# Patient Record
Sex: Female | Born: 1940 | Race: White | Hispanic: No | Marital: Married | Smoking: Never smoker
Health system: Southern US, Community
[De-identification: ages and names within clinical notes are randomized; demographics above are authoritative.]

## PROBLEM LIST (undated history)

## (undated) DIAGNOSIS — M1990 Unspecified osteoarthritis, unspecified site: Secondary | ICD-10-CM

## (undated) DIAGNOSIS — I10 Essential (primary) hypertension: Secondary | ICD-10-CM

## (undated) DIAGNOSIS — R7303 Prediabetes: Secondary | ICD-10-CM

## (undated) DIAGNOSIS — E079 Disorder of thyroid, unspecified: Secondary | ICD-10-CM

## (undated) DIAGNOSIS — E785 Hyperlipidemia, unspecified: Secondary | ICD-10-CM

## (undated) DIAGNOSIS — E559 Vitamin D deficiency, unspecified: Secondary | ICD-10-CM

## (undated) HISTORY — DX: Vitamin D deficiency, unspecified: E55.9

## (undated) HISTORY — DX: Prediabetes: R73.03

## (undated) HISTORY — DX: Hyperlipidemia, unspecified: E78.5

## (undated) HISTORY — DX: Essential (primary) hypertension: I10

## (undated) HISTORY — DX: Disorder of thyroid, unspecified: E07.9

## (undated) HISTORY — PX: APPENDECTOMY: SHX54

## (undated) HISTORY — PX: TONSILLECTOMY: SHX5217

## (undated) HISTORY — DX: Unspecified osteoarthritis, unspecified site: M19.90

---

## 1979-07-17 HISTORY — PX: OTHER SURGICAL HISTORY: SHX169

## 1998-02-08 ENCOUNTER — Ambulatory Visit (HOSPITAL_COMMUNITY): Admission: RE | Admit: 1998-02-08 | Discharge: 1998-02-08 | Payer: Self-pay | Admitting: *Deleted

## 1999-05-22 ENCOUNTER — Other Ambulatory Visit: Admission: RE | Admit: 1999-05-22 | Discharge: 1999-05-22 | Payer: Self-pay | Admitting: *Deleted

## 2006-11-18 ENCOUNTER — Other Ambulatory Visit: Admission: RE | Admit: 2006-11-18 | Discharge: 2006-11-18 | Payer: Self-pay | Admitting: Internal Medicine

## 2007-01-01 ENCOUNTER — Ambulatory Visit: Payer: Self-pay | Admitting: Gastroenterology

## 2007-01-06 ENCOUNTER — Ambulatory Visit (HOSPITAL_COMMUNITY): Admission: RE | Admit: 2007-01-06 | Discharge: 2007-01-06 | Payer: Self-pay | Admitting: Internal Medicine

## 2007-01-13 LAB — HM COLONOSCOPY

## 2007-01-20 ENCOUNTER — Ambulatory Visit: Payer: Self-pay | Admitting: Gastroenterology

## 2007-01-20 ENCOUNTER — Encounter: Payer: Self-pay | Admitting: Gastroenterology

## 2010-03-27 ENCOUNTER — Ambulatory Visit (HOSPITAL_COMMUNITY): Admission: RE | Admit: 2010-03-27 | Discharge: 2010-03-27 | Payer: Self-pay | Admitting: Internal Medicine

## 2010-03-27 LAB — HM DEXA SCAN

## 2010-05-30 ENCOUNTER — Other Ambulatory Visit: Admission: RE | Admit: 2010-05-30 | Discharge: 2010-05-30 | Payer: Self-pay | Admitting: Internal Medicine

## 2010-05-30 LAB — HM PAP SMEAR: HM Pap smear: NORMAL

## 2011-02-27 ENCOUNTER — Other Ambulatory Visit (HOSPITAL_COMMUNITY): Payer: Self-pay | Admitting: Internal Medicine

## 2011-02-27 DIAGNOSIS — Z1231 Encounter for screening mammogram for malignant neoplasm of breast: Secondary | ICD-10-CM

## 2011-04-02 ENCOUNTER — Ambulatory Visit (HOSPITAL_COMMUNITY)
Admission: RE | Admit: 2011-04-02 | Discharge: 2011-04-02 | Disposition: A | Discharge status: Home / Self Care | Payer: Medicare Other | Source: Ambulatory Visit | Attending: Internal Medicine | Admitting: Internal Medicine

## 2011-04-02 DIAGNOSIS — Z1231 Encounter for screening mammogram for malignant neoplasm of breast: Secondary | ICD-10-CM | POA: Insufficient documentation

## 2011-04-09 ENCOUNTER — Other Ambulatory Visit: Payer: Self-pay | Admitting: Internal Medicine

## 2011-04-09 DIAGNOSIS — R928 Other abnormal and inconclusive findings on diagnostic imaging of breast: Secondary | ICD-10-CM

## 2011-04-18 ENCOUNTER — Ambulatory Visit
Admission: RE | Admit: 2011-04-18 | Discharge: 2011-04-18 | Disposition: A | Discharge status: Home / Self Care | Payer: Medicare Other | Source: Ambulatory Visit | Attending: Internal Medicine | Admitting: Internal Medicine

## 2011-04-18 DIAGNOSIS — R928 Other abnormal and inconclusive findings on diagnostic imaging of breast: Secondary | ICD-10-CM

## 2011-09-12 DIAGNOSIS — I10 Essential (primary) hypertension: Secondary | ICD-10-CM | POA: Diagnosis not present

## 2011-09-12 DIAGNOSIS — E559 Vitamin D deficiency, unspecified: Secondary | ICD-10-CM | POA: Diagnosis not present

## 2011-09-12 DIAGNOSIS — Z79899 Other long term (current) drug therapy: Secondary | ICD-10-CM | POA: Diagnosis not present

## 2011-09-12 DIAGNOSIS — E782 Mixed hyperlipidemia: Secondary | ICD-10-CM | POA: Diagnosis not present

## 2011-09-12 DIAGNOSIS — R7309 Other abnormal glucose: Secondary | ICD-10-CM | POA: Diagnosis not present

## 2012-02-14 ENCOUNTER — Encounter: Payer: Self-pay | Admitting: Gastroenterology

## 2012-03-12 DIAGNOSIS — E782 Mixed hyperlipidemia: Secondary | ICD-10-CM | POA: Diagnosis not present

## 2012-03-12 DIAGNOSIS — E559 Vitamin D deficiency, unspecified: Secondary | ICD-10-CM | POA: Diagnosis not present

## 2012-03-12 DIAGNOSIS — R7309 Other abnormal glucose: Secondary | ICD-10-CM | POA: Diagnosis not present

## 2012-03-12 DIAGNOSIS — Z79899 Other long term (current) drug therapy: Secondary | ICD-10-CM | POA: Diagnosis not present

## 2012-03-12 DIAGNOSIS — I10 Essential (primary) hypertension: Secondary | ICD-10-CM | POA: Diagnosis not present

## 2012-03-19 DIAGNOSIS — H04129 Dry eye syndrome of unspecified lacrimal gland: Secondary | ICD-10-CM | POA: Diagnosis not present

## 2012-05-07 ENCOUNTER — Other Ambulatory Visit: Payer: Self-pay | Admitting: Internal Medicine

## 2012-05-07 DIAGNOSIS — Z1231 Encounter for screening mammogram for malignant neoplasm of breast: Secondary | ICD-10-CM

## 2012-06-16 ENCOUNTER — Ambulatory Visit
Admission: RE | Admit: 2012-06-16 | Discharge: 2012-06-16 | Disposition: A | Discharge status: Home / Self Care | Payer: Medicare Other | Source: Ambulatory Visit | Attending: Internal Medicine | Admitting: Internal Medicine

## 2012-06-16 DIAGNOSIS — I10 Essential (primary) hypertension: Secondary | ICD-10-CM | POA: Diagnosis not present

## 2012-06-16 DIAGNOSIS — Z1231 Encounter for screening mammogram for malignant neoplasm of breast: Secondary | ICD-10-CM | POA: Diagnosis not present

## 2012-06-16 DIAGNOSIS — Z79899 Other long term (current) drug therapy: Secondary | ICD-10-CM | POA: Diagnosis not present

## 2012-06-16 DIAGNOSIS — E782 Mixed hyperlipidemia: Secondary | ICD-10-CM | POA: Diagnosis not present

## 2012-06-16 DIAGNOSIS — E559 Vitamin D deficiency, unspecified: Secondary | ICD-10-CM | POA: Diagnosis not present

## 2012-06-16 DIAGNOSIS — R7309 Other abnormal glucose: Secondary | ICD-10-CM | POA: Diagnosis not present

## 2012-07-30 DIAGNOSIS — E039 Hypothyroidism, unspecified: Secondary | ICD-10-CM | POA: Diagnosis not present

## 2012-09-01 DIAGNOSIS — E039 Hypothyroidism, unspecified: Secondary | ICD-10-CM | POA: Diagnosis not present

## 2012-10-15 DIAGNOSIS — L919 Hypertrophic disorder of the skin, unspecified: Secondary | ICD-10-CM | POA: Diagnosis not present

## 2012-10-15 DIAGNOSIS — L909 Atrophic disorder of skin, unspecified: Secondary | ICD-10-CM | POA: Diagnosis not present

## 2012-10-15 DIAGNOSIS — L570 Actinic keratosis: Secondary | ICD-10-CM | POA: Diagnosis not present

## 2012-10-15 DIAGNOSIS — L819 Disorder of pigmentation, unspecified: Secondary | ICD-10-CM | POA: Diagnosis not present

## 2012-10-15 DIAGNOSIS — L821 Other seborrheic keratosis: Secondary | ICD-10-CM | POA: Diagnosis not present

## 2012-10-15 DIAGNOSIS — D1801 Hemangioma of skin and subcutaneous tissue: Secondary | ICD-10-CM | POA: Diagnosis not present

## 2012-10-23 ENCOUNTER — Encounter: Payer: Self-pay | Admitting: Gastroenterology

## 2012-12-03 DIAGNOSIS — L821 Other seborrheic keratosis: Secondary | ICD-10-CM | POA: Diagnosis not present

## 2012-12-03 DIAGNOSIS — L570 Actinic keratosis: Secondary | ICD-10-CM | POA: Diagnosis not present

## 2012-12-03 DIAGNOSIS — L909 Atrophic disorder of skin, unspecified: Secondary | ICD-10-CM | POA: Diagnosis not present

## 2012-12-15 DIAGNOSIS — I10 Essential (primary) hypertension: Secondary | ICD-10-CM | POA: Diagnosis not present

## 2012-12-15 DIAGNOSIS — R7309 Other abnormal glucose: Secondary | ICD-10-CM | POA: Diagnosis not present

## 2012-12-15 DIAGNOSIS — E559 Vitamin D deficiency, unspecified: Secondary | ICD-10-CM | POA: Diagnosis not present

## 2012-12-15 DIAGNOSIS — E782 Mixed hyperlipidemia: Secondary | ICD-10-CM | POA: Diagnosis not present

## 2012-12-15 DIAGNOSIS — Z79899 Other long term (current) drug therapy: Secondary | ICD-10-CM | POA: Diagnosis not present

## 2013-04-17 ENCOUNTER — Other Ambulatory Visit: Payer: Self-pay | Admitting: Dermatology

## 2013-04-17 DIAGNOSIS — D485 Neoplasm of uncertain behavior of skin: Secondary | ICD-10-CM | POA: Diagnosis not present

## 2013-04-17 DIAGNOSIS — L578 Other skin changes due to chronic exposure to nonionizing radiation: Secondary | ICD-10-CM | POA: Diagnosis not present

## 2013-04-17 DIAGNOSIS — L259 Unspecified contact dermatitis, unspecified cause: Secondary | ICD-10-CM | POA: Diagnosis not present

## 2013-04-29 DIAGNOSIS — L570 Actinic keratosis: Secondary | ICD-10-CM | POA: Diagnosis not present

## 2013-05-08 ENCOUNTER — Other Ambulatory Visit: Payer: Self-pay

## 2013-05-08 DIAGNOSIS — Z1231 Encounter for screening mammogram for malignant neoplasm of breast: Secondary | ICD-10-CM

## 2013-05-11 DIAGNOSIS — H00019 Hordeolum externum unspecified eye, unspecified eyelid: Secondary | ICD-10-CM | POA: Diagnosis not present

## 2013-05-29 DIAGNOSIS — Z23 Encounter for immunization: Secondary | ICD-10-CM | POA: Diagnosis not present

## 2013-06-10 ENCOUNTER — Other Ambulatory Visit: Payer: Self-pay | Admitting: Internal Medicine

## 2013-06-10 ENCOUNTER — Telehealth: Payer: Self-pay | Admitting: Internal Medicine

## 2013-06-10 DIAGNOSIS — I10 Essential (primary) hypertension: Secondary | ICD-10-CM

## 2013-06-10 MED ORDER — ATENOLOL 50 MG PO TABS: 50.0000 mg | ORAL_TABLET | Freq: Every day | ORAL | Status: DC

## 2013-06-10 NOTE — Telephone Encounter (Signed)
Pt called for an rx to be called in for Atenelol 50mg  1 daily.  She could not tolerate the previous med and was switched to this medicine, but does not have an rx.  Please advise Pharm: [facility] [address] Next appt: 06-22-13 CPE  With Dr [facility]

## 2013-06-13 MED ORDER — ATENOLOL 50 MG PO TABS: 50.0000 mg | ORAL_TABLET | Freq: Every day | ORAL | Status: DC

## 2013-06-20 ENCOUNTER — Encounter: Payer: Self-pay | Admitting: Internal Medicine

## 2013-06-20 DIAGNOSIS — E039 Hypothyroidism, unspecified: Secondary | ICD-10-CM | POA: Insufficient documentation

## 2013-06-20 DIAGNOSIS — I10 Essential (primary) hypertension: Secondary | ICD-10-CM | POA: Insufficient documentation

## 2013-06-20 DIAGNOSIS — R7309 Other abnormal glucose: Secondary | ICD-10-CM | POA: Insufficient documentation

## 2013-06-20 DIAGNOSIS — E559 Vitamin D deficiency, unspecified: Secondary | ICD-10-CM | POA: Insufficient documentation

## 2013-06-20 DIAGNOSIS — E785 Hyperlipidemia, unspecified: Secondary | ICD-10-CM | POA: Insufficient documentation

## 2013-06-22 ENCOUNTER — Encounter: Payer: Self-pay | Admitting: Internal Medicine

## 2013-06-22 ENCOUNTER — Ambulatory Visit (INDEPENDENT_AMBULATORY_CARE_PROVIDER_SITE_OTHER): Payer: Medicare Other | Admitting: Internal Medicine

## 2013-06-22 ENCOUNTER — Ambulatory Visit
Admission: RE | Admit: 2013-06-22 | Discharge: 2013-06-22 | Disposition: A | Discharge status: Home / Self Care | Payer: Medicare Other | Source: Ambulatory Visit

## 2013-06-22 VITALS — BP 136/84 | HR 64 | Temp 98.2°F | Resp 16 | Ht 62.0 in | Wt 155.8 lb

## 2013-06-22 DIAGNOSIS — E782 Mixed hyperlipidemia: Secondary | ICD-10-CM | POA: Diagnosis not present

## 2013-06-22 DIAGNOSIS — Z1212 Encounter for screening for malignant neoplasm of rectum: Secondary | ICD-10-CM

## 2013-06-22 DIAGNOSIS — R7309 Other abnormal glucose: Secondary | ICD-10-CM

## 2013-06-22 DIAGNOSIS — E559 Vitamin D deficiency, unspecified: Secondary | ICD-10-CM

## 2013-06-22 DIAGNOSIS — Z1231 Encounter for screening mammogram for malignant neoplasm of breast: Secondary | ICD-10-CM | POA: Diagnosis not present

## 2013-06-22 DIAGNOSIS — I10 Essential (primary) hypertension: Secondary | ICD-10-CM | POA: Diagnosis not present

## 2013-06-22 DIAGNOSIS — Z79899 Other long term (current) drug therapy: Secondary | ICD-10-CM

## 2013-06-22 DIAGNOSIS — Z0000 Encounter for general adult medical examination without abnormal findings: Secondary | ICD-10-CM

## 2013-06-22 LAB — CBC WITH DIFFERENTIAL/PLATELET
Basophils Absolute: 0 10*3/uL (ref 0.0–0.1)
Eosinophils Relative: 2 % (ref 0–5)
HCT: 39.5 % (ref 36.0–46.0)
Lymphocytes Relative: 35 % (ref 12–46)
Lymphs Abs: 3.1 10*3/uL (ref 0.7–4.0)
MCV: 82.8 fL (ref 78.0–100.0)
Monocytes Absolute: 0.7 10*3/uL (ref 0.1–1.0)
Neutro Abs: 4.9 10*3/uL (ref 1.7–7.7)
RBC: 4.77 MIL/uL (ref 3.87–5.11)
RDW: 14.3 % (ref 11.5–15.5)
WBC: 8.9 10*3/uL (ref 4.0–10.5)

## 2013-06-22 LAB — HEMOGLOBIN A1C: Hgb A1c MFr Bld: 6 % — ABNORMAL HIGH (ref <5.7)

## 2013-06-22 NOTE — Patient Instructions (Signed)

## 2013-06-22 NOTE — Progress Notes (Signed)
Patient ID: Anna Caldwell, female   DOB: April 19, 1941, 72 y.o.   MRN: [id number]  Annual Screening Comprehensive Examination  This very nice 72 yo MWF presents for complete physical.  Patient has been followed for HTN, compensated Hypothyroidism,  Prediabetes, Hyperlipidemia, and Vitamin D Deficiency.   Patient's BP has not been monitored at home. Today's BP is 136/84. Patient was given an Rx for atenolol which she states she never filled.  Patient denies any cardiac symptoms as chest pain, palpitations, shortness of breath, dizziness or ankle swelling.   Patient's hyperlipidemia is controlled with diet and medications. Patient denies myalgias or other medication SE's. Last cholesterol last visit was 156, triglycerides 141, HDL 43 and LDL 83 in June - all at goal.     Patient has prediabetes with A1c 6.2% in July 2011 and last A1c 5.6% in June 2014. Patient denies reactive hypoglycemic symptoms, visual blurring, diabetic polys, or paresthesias.    Patient also had recent skin Bx by Dr l Nicholas Lose finding spongiotic dermatitis and c/o a migratory effervesent pruritic rash for which she is using a steroid cream.   Finally, patient has history of Vitamin D Deficiency with last vitamin D 75 in June.     Medication Sig Dispense Refill  . aspirin 81 MG chewable tablet Chew by mouth daily.      . Cholecalciferol (VITAMIN D PO) Take 10,000 Int'l Units by mouth daily.      Marland Kitchen KRILL OIL PO Take by mouth daily. Two tablets daily.      Marland Kitchen levothyroxine (SYNTHROID, LEVOTHROID) 50 MCG tablet Take 50 mcg by mouth daily. 1/2 tab daily         No Known Allergies  Past Medical History  Diagnosis Date  . Hypertension   . Hyperlipidemia   . Vitamin D deficiency   . Thyroid disease   . Arthritis   . Prediabetes     Past Surgical History  Procedure Laterality Date  . Appendectomy    . Tonsillectomy    . Other surgical history  1981    BTL     Family History  Problem Relation Age of Onset  .  Heart disease Mother   . Asthma Mother   . COPD Father   . Heart disease Father   . Gout Father   . Cancer Brother     History  Substance Use Topics  . Smoking status: Never Smoker   . Smokeless tobacco: Never Used  . Alcohol Use: No    ROS Constitutional: Denies fever, chills, weight loss/gain, headaches, insomnia, fatigue, night sweats, and change in appetite. Eyes: Denies redness, blurred vision, diplopia, discharge, itchy, watery eyes.  ENT: Denies discharge, congestion, post nasal drip, epistaxis, sore throat, earache, hearing loss, dental pain, Tinnitus, Vertigo, Sinus pain, snoring.  Cardio: Denies chest pain, palpitations, irregular heartbeat, syncope, dyspnea, diaphoresis, orthopnea, PND, claudication, edema Respiratory: denies cough, dyspnea, DOE, pleurisy, hoarseness, laryngitis, wheezing.  Gastrointestinal: Denies dysphagia, heartburn, reflux, water brash, pain, cramps, nausea, vomiting, bloating, diarrhea, constipation, hematemesis, melena, hematochezia, jaundice, hemorrhoids Genitourinary: Denies dysuria, frequency, urgency, nocturia, hesitancy, discharge, hematuria, flank pain Breast:Breast lumps, nipple discharge, bleeding.  Musculoskeletal: Denies arthralgia, myalgia, stiffness, Jt. Swelling, pain, limp, and strain/sprain. Skin: Denies hives, warts, acne, eczema, changing in skin lesion. Does c/o  pruritis &  rash as above. Neuro: No weakness, tremor, incoordination, spasms, paresthesia, pain Psychiatric: Denies confusion, memory loss, sensory loss Endocrine: Denies change in weight, skin, hair change, nocturia, and paresthesia, diabetic polys, visual blurring, hyper /  hypo glycemic episodes.  Heme/Lymph: No excessive bleeding, bruising, enlarged lymph nodes.  Filed Vitals:   06/22/13 1607  BP: 136/84  Pulse: 64  Temp: 98.2 F (36.8 C)  Resp: 16    Estimated body mass index is 28.49 kg/(m^2) as calculated from the following:   Height as of this encounter: 5'  2" (1.575 m).   Weight as of this encounter: 155 lb 12.8 oz (70.67 kg).  Physical Exam General Appearance: Well nourished, in no apparent distress. Eyes: PERRLA, EOMs, conjunctiva no swelling or erythema, normal fundi and vessels. Sinuses: No frontal/maxillary tenderness ENT/Mouth: EACs patent / TMs  nl. Nares clear without erythema, swelling, mucoid exudates. Oral hygiene is good. No erythema, swelling, or exudate. Tongue normal, non-obstructing. Tonsils not swollen or erythematous. Hearing normal.  Neck: Supple, thyroid normal. No bruits, nodes or JVD. Respiratory: Respiratory effort normal.  BS equal and clear bilateral without rales, rhonci, wheezing or stridor. Cardio: Heart sounds are normal with regular rate and rhythm and no murmurs, rubs or gallops. Peripheral pulses are normal and equal bilaterally without edema. No aortic or femoral bruits. Chest: symmetric with normal excursions and percussion. Breasts: Symmetric, without lumps, nipple discharge, retractions, or fibrocystic changes.  Abdomen: Flat, soft, with bowl sounds. Nontender, no guarding, rebound, hernias, masses, or organomegaly.  Lymphatics: Non tender without lymphadenopathy.  Genitourinary:  Musculoskeletal: Full ROM all peripheral extremities, joint stability, 5/5 strength, and normal gait. Skin: Warm and dry without rashes, lesions, cyanosis, clubbing or  ecchymosis.  Neuro: Cranial nerves intact, reflexes equal bilaterally. Normal muscle tone, no cerebellar symptoms. Sensation intact.  Pysch: Awake and oriented X 3, normal affect, Insight and Judgment appropriate.   Assessment and Plan  1. Annual Screening Examination 2. Hypertension, labile & monitoring 3. Hyperlipidemia, at goal with diet 4. Pre Diabetes, in remission with improved diet 5. Vitamin D Deficiency  Continue prudent diet as discussed, weight control, BP monitoring, regular exercise, and medications. Discussed med's effects and SE's. Screening labs  and tests as requested with regular follow-up as recommended.

## 2013-06-23 LAB — HEPATIC FUNCTION PANEL
AST: 21 U/L (ref 0–37)
Alkaline Phosphatase: 83 U/L (ref 39–117)
Indirect Bilirubin: 0.2 mg/dL (ref 0.0–0.9)
Total Bilirubin: 0.3 mg/dL (ref 0.3–1.2)

## 2013-06-23 LAB — LIPID PANEL
HDL: 43 mg/dL (ref >39)
LDL Cholesterol: 91 mg/dL (ref 0–99)
Total CHOL/HDL Ratio: 3.7 Ratio
Triglycerides: 126 mg/dL (ref <150)
VLDL: 25 mg/dL (ref 0–40)

## 2013-06-23 LAB — MICROALBUMIN / CREATININE URINE RATIO: Microalb Creat Ratio: 8 mg/g (ref 0.0–30.0)

## 2013-06-23 LAB — BASIC METABOLIC PANEL WITH GFR
CO2: 31 mEq/L (ref 19–32)
Chloride: 106 mEq/L (ref 96–112)
Creat: 0.69 mg/dL (ref 0.50–1.10)
GFR, Est Non African American: 88 mL/min
Potassium: 4 mEq/L (ref 3.5–5.3)

## 2013-06-23 LAB — URINALYSIS, MICROSCOPIC ONLY
Casts: NONE SEEN
Crystals: NONE SEEN
Squamous Epithelial / LPF: NONE SEEN

## 2013-06-23 LAB — TSH: TSH: 3.352 u[IU]/mL (ref 0.350–4.500)

## 2013-06-29 ENCOUNTER — Other Ambulatory Visit: Payer: Self-pay

## 2013-06-29 DIAGNOSIS — Z1231 Encounter for screening mammogram for malignant neoplasm of breast: Secondary | ICD-10-CM

## 2013-07-13 ENCOUNTER — Other Ambulatory Visit: Payer: Self-pay | Admitting: Physician Assistant

## 2013-07-13 MED ORDER — LEVOTHYROXINE SODIUM 50 MCG PO TABS: ORAL_TABLET | ORAL | Status: DC

## 2013-07-17 ENCOUNTER — Other Ambulatory Visit: Payer: Self-pay | Admitting: Dermatology

## 2013-07-17 DIAGNOSIS — D485 Neoplasm of uncertain behavior of skin: Secondary | ICD-10-CM | POA: Diagnosis not present

## 2013-07-17 DIAGNOSIS — L259 Unspecified contact dermatitis, unspecified cause: Secondary | ICD-10-CM | POA: Diagnosis not present

## 2013-07-17 DIAGNOSIS — C44621 Squamous cell carcinoma of skin of unspecified upper limb, including shoulder: Secondary | ICD-10-CM | POA: Diagnosis not present

## 2013-08-24 ENCOUNTER — Ambulatory Visit (INDEPENDENT_AMBULATORY_CARE_PROVIDER_SITE_OTHER): Payer: Medicare Other | Admitting: Emergency Medicine

## 2013-08-24 ENCOUNTER — Encounter: Payer: Self-pay | Admitting: Emergency Medicine

## 2013-08-24 ENCOUNTER — Other Ambulatory Visit: Payer: Medicare Other

## 2013-08-24 VITALS — BP 124/70 | HR 82 | Temp 98.2°F | Resp 18 | Ht 62.5 in | Wt 155.0 lb

## 2013-08-24 DIAGNOSIS — R197 Diarrhea, unspecified: Secondary | ICD-10-CM | POA: Diagnosis not present

## 2013-08-24 DIAGNOSIS — M2550 Pain in unspecified joint: Secondary | ICD-10-CM

## 2013-08-24 DIAGNOSIS — R21 Rash and other nonspecific skin eruption: Secondary | ICD-10-CM | POA: Diagnosis not present

## 2013-08-24 LAB — SEDIMENTATION RATE: SED RATE: 4 mm/h (ref 0–22)

## 2013-08-24 NOTE — Progress Notes (Signed)
Subjective:    Patient ID: Anna Caldwell, female    DOB: 07-Mar-1941, 73 y.o.   MRN: [id number]  HPI Comments: 73 yo female wants to be tested for celiac disease. She notes daughter tested + for celiac and she wants to be screened. She notes she has unusual rash that occurs on / off without specific triggers. She notes Dr Ubaldo Glassing treats her topically which helps. She denies any stomach/ bowel issues. She notes magnesium in past caused loose stools but resolved with d/c of magnesium. She notes she occasionally has constipation followed by diarrhea but always resolves. She also notes arthralgias on/ off. + Rheumatoid MGF   Current Outpatient Prescriptions on File Prior to Visit  Medication Sig Dispense Refill  . aspirin 81 MG chewable tablet Chew by mouth daily.      . Cholecalciferol (VITAMIN D PO) Take 10,000 Int'l Units by mouth daily.      . fluocinonide cream (LIDEX) 0.05 %       . KRILL OIL PO Take by mouth daily. Two tablets daily.      Marland Kitchen levothyroxine (SYNTHROID, LEVOTHROID) 50 MCG tablet 1/2-1 tab daily as instructed  90 tablet  0   No current facility-administered medications on file prior to visit.   No Known Allergies Past Medical History  Diagnosis Date  . Hypertension   . Hyperlipidemia   . Vitamin D deficiency   . Thyroid disease   . Arthritis   . Prediabetes      Review of Systems  Constitutional: Positive for fatigue.  Gastrointestinal: Positive for diarrhea and constipation.  Musculoskeletal: Positive for arthralgias.  Skin: Positive for rash.  All other systems reviewed and are negative.   BP 124/70  Pulse 82  Temp(Src) 98.2 F (36.8 C) (Oral)  Resp 18  Ht 5' 2.5" (1.588 m)  Wt 155 lb (70.308 kg)  BMI 27.88 kg/m2     Objective:   Physical Exam  Nursing note and vitals reviewed. Constitutional: She is oriented to person, place, and time. She appears well-developed and well-nourished. No distress.  HENT:  Head: Normocephalic and atraumatic.   Right Ear: External ear normal.  Left Ear: External ear normal.  Nose: Nose normal.  Mouth/Throat: Oropharynx is clear and moist.  Eyes: Conjunctivae and EOM are normal.  Neck: Normal range of motion. Neck supple. No JVD present. No thyromegaly present.  Cardiovascular: Normal rate, regular rhythm, normal heart sounds and intact distal pulses.   Pulmonary/Chest: Effort normal and breath sounds normal.  Abdominal: Soft. Bowel sounds are normal. She exhibits no distension and no mass. There is no tenderness. There is no rebound and no guarding.  Musculoskeletal: Normal range of motion. She exhibits no edema and no tenderness.  Lymphadenopathy:    She has no cervical adenopathy.  Neurological: She is alert and oriented to person, place, and time. No cranial nerve deficit.  Skin: Skin is warm and dry. Rash noted. No erythema. No pallor.  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 Her behavior is normal. Judgment and thought content normal.          Assessment & Plan:  1. Ongoing rash with out complete resolution vs autoimmune- check labs, keep derm f/u 2. Diarrhea HX- check labs, bland diet, probiotic QD 3. Arthralgia- ? Autoimmune vs arthritis, check labs, needs QD activity, water exercise advised

## 2013-08-24 NOTE — Patient Instructions (Signed)
Celiac Disease  Celiac disease is a digestive disease that causes your body's natural defense system (immune system) to react against its own cells. It interferes with taking in (absorbing) nutrients from food. Celiac disease is also known as celiac sprue, nontropical sprue, and gluten-sensitive enteropathy. People who have celiac disease cannot tolerate gluten. Gluten is a protein found in wheat, rye, and barley. With time, celiac disease will damage the cells lining the small intestine. This leads to being unable to absorb nutrients from food (malabsorption), diarrhea, and nutritional problems.  CAUSES   Celiac disease is genetic. This means you have a higher likelihood of getting the disease if someone in your family has or has had it. Up to 10% of your close relatives (parent, sibling, child) may also have the disease.   People with celiac disease tend to have other autoimmune diseases. The link may be genetic. These diseases include:   Dermatitis herpetiformis.   Thyroid disease.   Systemic lupus erythematosis.   Type 1 diabetes.   Liver disease.   Collagen vascular disease.   Rheumatoid arthritis.   Sjgren's syndrome.  SYMPTOMS   The symptoms of celiac disease vary from person to person. The symptoms are generally digestive or nutritional.  Digestive symptoms include:   Recurring belly (abdominal) bloating and pain.   Gas.   Long-term (chronic) diarrhea.   Pale, bad-smelling, greasy, or oily stool.  Nutritional symptoms include:   Failure to thrive in infants.   Delayed growth in children.   Weight loss in children and adults.   Missed menstrual periods (often due to extreme weight loss).   Anemia.   Weakening bones (osteoporosis).   Fatigue and weakness.   Tingling or other signs of nerve damage (peripheral neuropathy).   Depression.  DIAGNOSIS    If your symptoms and physical exam suggest that a digestive disorder or malnutrition is present, your caregiver may suspect celiac disease. You may have already begun a gluten-free diet. If symptoms persist, testing may be needed to confirm the diagnosis. Some tests are best done while you are on a normal, unrestricted diet. Tests may include:   Blood tests to check for nutritional deficiencies.   Blood tests to look for evidence that the body is producing antibodies against its own small intestine cells.   Taking a tissue sample (biopsy) from the small bowel for evaluation.   X-rays of the small bowel.   Evaluating the stool for fat.   Tests to check for nutrient absorption from the intestine.  TREATMENT   It is important to seek treatment. Untreated celiac disease can cause growth problems (in children), anemia, osteoporosis, and possible nerve problems. A pregnant patient with untreated celiac disease has a higher risk of miscarriage, and the fetus has an increased risk of low birth weight and other growth problems. If celiac disease is diagnosed in the early stages, treatment can allow you to live a long, nearly symptom-free life.  Treatment includes following a gluten-free diet. This means avoiding all foods that contain gluten. Eating even a small amount of gluten can damage your intestine. For most people, following this diet will stop symptoms. It will heal existing intestinal damage and prevent further damage. Improvements begin within days of starting the diet. The small intestine is usually completely healed within 3 to 6 months, or it may take up to 2 years for older adults.   A small percentage of people do not improve on the gluten-free diet. Depending on your age and the stage   at which you were diagnosed, some problems such as delayed growth and discolored teeth may not improve. Sometimes, damaged intestines cannot heal. If your intestines are not absorbing enough nutrients, you may need to receive nutrition supplements through an intravenous (IV) tube. Drug treatments are being tested for unresponsive celiac disease. In this case, you may need to be evaluated for complications of the disease.  Your caregiver may also recommend:   A pneumonia vaccination.   Nutrients and other treatments for any nutritional deficiencies.  Your caregiver can provide you with more information on a gluten-free diet. Discussion with a dietician skilled in this illness will be valuable. Support groups may also be helpful.  HOME CARE INSTRUCTIONS    Focus on a gluten-free diet. This diet must become a way of life.   Monitor your response to the gluten-free diet and treat any nutritional deficiencies.   Prepare ahead of time if you decide to eat outside the home.   Make and keep your regular follow-up visits with your caregiver.   Suggest to family members that they get screened for early signs of the disease.  SEEK MEDICAL CARE IF:    You continue to have digestive symptoms (gas, cramping, diarrhea) despite a proper diet.   You have trouble sticking to the gluten-free diet.   You develop an itchy rash with groups of tiny blisters.   You develop severe weakness, balance problems, menstrual problems, or depression.  Document Released: 07/02/2005 Document Revised: 09/24/2011 Document Reviewed: 10/19/2009  [employer] Patient Information 2014 [employer].

## 2013-08-25 LAB — ~~LOC~~ ALLERGY PANEL
Allergen, Cedar tree, t12: <0.1 kU/L
Allergen, Comm Silver Birch, t9: <0.1 kU/L
Allergen, D pternoyssinus,d7: <0.1 kU/L
Allergen, Mulberry, t76: <0.1 kU/L
Alternaria Alternata: <0.1 kU/L
Aspergillus fumigatus, m3: <0.1 kU/L
Bahia Grass: <0.1 kU/L
Bermuda Grass: <0.1 kU/L
Cat Dander: <0.1 kU/L
Cladosporium Herbarum: <0.1 kU/L
Common Ragweed: <0.1 kU/L
Dog Dander: <0.1 kU/L
Johnson Grass: <0.1 kU/L
Nettle: <0.1 kU/L
Oak: <0.1 kU/L
Pecan/Hickory Tree IgE: <0.1 kU/L
Penicillium Notatum: <0.1 kU/L
Rough Pigweed  IgE: <0.1 kU/L
Stemphylium Botryosum: <0.1 kU/L
Sweet Gum: <0.1 kU/L
Timothy Grass: <0.1 kU/L

## 2013-08-25 LAB — ALLERGEN FOOD PROFILE SPECIFIC IGE
Apple: <0.1 kU/L
Corn: <0.1 kU/L
Fish Cod: <0.1 kU/L
IgE (Immunoglobulin E), Serum: 5 IU/mL (ref 0.0–180.0)
Milk IgE: <0.1 kU/L
Soybean IgE: <0.1 kU/L
Tomato IgE: <0.1 kU/L
Tuna IgE: <0.1 kU/L
Wheat IgE: <0.1 kU/L

## 2013-08-25 LAB — CELIAC PANEL 10
Endomysial Screen: NEGATIVE
GLIADIN IGG: 4.3 U/mL (ref <20)
Gliadin IgA: 4.4 U/mL (ref <20)
IGA: 251 mg/dL (ref 69–380)
TISSUE TRANSGLUTAMINASE AB, IGA: 5.1 U/mL (ref <20)
Tissue Transglut Ab: 9.7 U/mL (ref <20)

## 2013-08-25 LAB — CYCLIC CITRUL PEPTIDE ANTIBODY, IGG: Cyclic Citrullin Peptide Ab: <2 U/mL (ref 0.0–5.0)

## 2013-08-25 LAB — ANTI-DNA ANTIBODY, DOUBLE-STRANDED: ds DNA Ab: 8 IU/mL — ABNORMAL HIGH

## 2013-08-25 LAB — ANA: Anti Nuclear Antibody(ANA): NEGATIVE

## 2013-08-26 ENCOUNTER — Other Ambulatory Visit: Payer: Self-pay | Admitting: Dermatology

## 2013-08-26 DIAGNOSIS — L259 Unspecified contact dermatitis, unspecified cause: Secondary | ICD-10-CM | POA: Diagnosis not present

## 2013-08-26 DIAGNOSIS — D485 Neoplasm of uncertain behavior of skin: Secondary | ICD-10-CM | POA: Diagnosis not present

## 2013-08-26 DIAGNOSIS — C44621 Squamous cell carcinoma of skin of unspecified upper limb, including shoulder: Secondary | ICD-10-CM | POA: Diagnosis not present

## 2013-09-14 DIAGNOSIS — Z85828 Personal history of other malignant neoplasm of skin: Secondary | ICD-10-CM | POA: Diagnosis not present

## 2013-09-14 DIAGNOSIS — C4492 Squamous cell carcinoma of skin, unspecified: Secondary | ICD-10-CM | POA: Diagnosis not present

## 2013-09-23 ENCOUNTER — Encounter: Payer: Self-pay | Admitting: Internal Medicine

## 2013-09-23 ENCOUNTER — Ambulatory Visit (INDEPENDENT_AMBULATORY_CARE_PROVIDER_SITE_OTHER): Payer: Medicare Other | Admitting: Internal Medicine

## 2013-09-23 VITALS — BP 132/80 | HR 76 | Temp 98.1°F | Resp 16 | Ht 62.5 in | Wt 156.6 lb

## 2013-09-23 DIAGNOSIS — I10 Essential (primary) hypertension: Secondary | ICD-10-CM | POA: Diagnosis not present

## 2013-09-23 DIAGNOSIS — E785 Hyperlipidemia, unspecified: Secondary | ICD-10-CM

## 2013-09-23 DIAGNOSIS — E559 Vitamin D deficiency, unspecified: Secondary | ICD-10-CM | POA: Diagnosis not present

## 2013-09-23 DIAGNOSIS — Z79899 Other long term (current) drug therapy: Secondary | ICD-10-CM | POA: Diagnosis not present

## 2013-09-23 DIAGNOSIS — R7309 Other abnormal glucose: Secondary | ICD-10-CM | POA: Diagnosis not present

## 2013-09-23 DIAGNOSIS — R7303 Prediabetes: Secondary | ICD-10-CM

## 2013-09-23 LAB — CBC WITH DIFFERENTIAL/PLATELET
Basophils Absolute: 0.1 10*3/uL (ref 0.0–0.1)
Basophils Relative: 1 % (ref 0–1)
EOS ABS: 0.2 10*3/uL (ref 0.0–0.7)
EOS PCT: 2 % (ref 0–5)
HCT: 39.4 % (ref 36.0–46.0)
Hemoglobin: 13.3 g/dL (ref 12.0–15.0)
Lymphocytes Relative: 34 % (ref 12–46)
Lymphs Abs: 2.7 10*3/uL (ref 0.7–4.0)
MCH: 27.5 pg (ref 26.0–34.0)
MCHC: 33.8 g/dL (ref 30.0–36.0)
MCV: 81.6 fL (ref 78.0–100.0)
Monocytes Absolute: 0.6 10*3/uL (ref 0.1–1.0)
Monocytes Relative: 8 % (ref 3–12)
Neutro Abs: 4.4 10*3/uL (ref 1.7–7.7)
Neutrophils Relative %: 55 % (ref 43–77)
Platelets: 275 10*3/uL (ref 150–400)
RBC: 4.83 MIL/uL (ref 3.87–5.11)
RDW: 14.3 % (ref 11.5–15.5)
WBC: 8 10*3/uL (ref 4.0–10.5)

## 2013-09-23 LAB — HEMOGLOBIN A1C
HEMOGLOBIN A1C: 5.8 % — AB (ref <5.7)
MEAN PLASMA GLUCOSE: 120 mg/dL — AB (ref <117)

## 2013-09-23 NOTE — Progress Notes (Signed)
Patient ID: Anna Caldwell, female   DOB: [birth year], 73 y.o.   MRN: [id number]    This very nice 73 y.o. female presents for 3 month follow up with Hypertension, Hyperlipidemia, Pre-Diabetes and Vitamin D Deficiency.    HTN predates since   . BP has been controlled at home. Today's   . Patient denies any cardiac type chest pain, palpitations, dyspnea/orthopnea/PND, dizziness, claudication, or dependent edema.   Hyperlipidemia is controlled with diet & meds. Last Lipids as below at goal in Dec 2014. Patient denies myalgias or other med SE's.  Lab Results  Component Value Date   CHOL 159 06/22/2013   HDL 43 06/22/2013   LDLCALC 91 06/22/2013   TRIG 126 06/22/2013   CHOLHDL 3.7 06/22/2013    Also, the patient has history of PreDiabetes of 5.9% in 2011 and with last A1c of 6.0% in Dec 2014. Patient denies any symptoms of reactive hypoglycemia, diabetic polys, paresthesias or visual blurring.   Further, Patient has history of Vitamin D Deficiency of 13 in 2008 with last vitamin D of 75 in June 2014. Patient supplements vitamin D without any suspected side-effects.    Medication List       aspirin 81 MG chewable tablet  Chew by mouth daily.     fluocinonide cream 0.05 %  Commonly known as:  LIDEX     KRILL OIL PO  Take by mouth daily. Two tablets daily.     levothyroxine 50 MCG tablet  Commonly known as:  SYNTHROID, LEVOTHROID  1/2 tab daily as instructed     OVER THE COUNTER MEDICATION  daily. [employer] brand allergy pill     VITAMIN D PO  Take 10,000 Int'l Units by mouth daily.       No Known Allergies  PMHx:   Past Medical History  Diagnosis Date  . Hypertension   . Hyperlipidemia   . Vitamin D deficiency   . Thyroid disease   . Arthritis   . Prediabetes    FHx:    Reviewed / unchanged  SHx:    Reviewed / unchanged  Systems Review: Constitutional: Denies fever, chills, wt changes, headaches, insomnia, fatigue, night sweats, change in appetite. Eyes: Denies redness,  blurred vision, diplopia, discharge, itchy, watery eyes.  ENT: Denies discharge, congestion, post nasal drip, epistaxis, sore throat, earache, hearing loss, dental pain, tinnitus, vertigo, sinus pain, snoring.  CV: Denies chest pain, palpitations, irregular heartbeat, syncope, dyspnea, diaphoresis, orthopnea, PND, claudication, edema. Respiratory: denies cough, dyspnea, DOE, pleurisy, hoarseness, laryngitis, wheezing.  Gastrointestinal: Denies dysphagia, odynophagia, heartburn, reflux, water brash, abdominal pain or cramps, nausea, vomiting, bloating, diarrhea, constipation, hematemesis, melena, hematochezia,  or hemorrhoids. Genitourinary: Denies dysuria, frequency, urgency, nocturia, hesitancy, discharge, hematuria, flank pain. Musculoskeletal: Denies arthralgias, myalgias, stiffness, jt. swelling, pain, limp, strain/sprain.  Skin: Denies pruritus, rash, hives, warts, acne, eczema, change in skin lesion(s). Neuro: No weakness, tremor, incoordination, spasms, paresthesia, or pain. Psychiatric: Denies confusion, memory loss, or sensory loss. Endo: Denies change in weight, skin, hair change.  Heme/Lymph: No excessive bleeding, bruising, orenlarged lymph nodes.  On Exam:  BP 132/80  Pulse 76  Temp(Src) 98.1 F (36.7 C) (Temporal)  Resp 16  Ht 5' 2.5" (1.588 m)  Wt 156 lb 9.6 oz (71.033 kg)  BMI 28.17 kg/m2  Appears well nourished - in no distress. Eyes: PERRLA, EOMs, conjunctiva no swelling or erythema. Sinuses: No frontal/maxillary tenderness ENT/Mouth: EAC's clear, TM's nl w/o erythema, bulging. Nares clear w/o erythema, swelling, exudates. Oropharynx clear without erythema or  exudates. Oral hygiene is good. Tongue normal, non obstructing. Hearing intact.  Neck: Supple. Thyroid nl. Car 2+/2+ without bruits, nodes or JVD. Chest: Respirations nl with BS clear & equal w/o rales, rhonchi, wheezing or stridor.  Cor: Heart sounds normal w/ regular rate and rhythm without sig. murmurs,  gallops, clicks, or rubs. Peripheral pulses normal and equal  without edema.  Abdomen: Soft & bowel sounds normal. Non-tender w/o guarding, rebound, hernias, masses, or organomegaly.  Lymphatics: Unremarkable.  Musculoskeletal: Full ROM all peripheral extremities, joint stability, 5/5 strength, and normal gait.  Skin: Warm, dry without exposed rashes, lesions, ecchymosis apparent.  Neuro: Cranial nerves intact, reflexes equal bilaterally. Sensory-motor testing grossly intact. Tendon reflexes grossly intact.  Pysch: Alert & oriented x 3. Insight and judgement nl & appropriate. No ideations.  Assessment and Plan:  1. Hypertension - Continue monitor blood pressure at home. Continue diet/meds same.  2. Hyperlipidemia - Continue diet/meds, exercise,& lifestyle modifications. Continue monitor periodic cholesterol/liver & renal functions   3. Pre-diabetes/Insulin Resistance - Continue diet, exercise, lifestyle modifications. Monitor appropriate labs.  4. Vitamin D Deficiency - Continue supplementation.  5. Hypothyroidism - compensated  Recommended regular exercise, BP monitoring, weight control, and discussed med and SE's. Recommended labs to assess and monitor clinical status. Further disposition pending results of labs.

## 2013-09-23 NOTE — Patient Instructions (Signed)

## 2013-09-24 LAB — HEPATIC FUNCTION PANEL
ALBUMIN: 4.1 g/dL (ref 3.5–5.2)
ALT: 19 U/L (ref 0–35)
AST: 20 U/L (ref 0–37)
Alkaline Phosphatase: 83 U/L (ref 39–117)
Bilirubin, Direct: 0.1 mg/dL (ref 0.0–0.3)
Indirect Bilirubin: 0.1 mg/dL — ABNORMAL LOW (ref 0.2–1.2)
TOTAL PROTEIN: 6.2 g/dL (ref 6.0–8.3)
Total Bilirubin: 0.2 mg/dL (ref 0.2–1.2)

## 2013-09-24 LAB — BASIC METABOLIC PANEL WITH GFR
BUN: 16 mg/dL (ref 6–23)
CHLORIDE: 106 meq/L (ref 96–112)
CO2: 28 mEq/L (ref 19–32)
Calcium: 9.4 mg/dL (ref 8.4–10.5)
Creat: 0.67 mg/dL (ref 0.50–1.10)
GFR, EST NON AFRICAN AMERICAN: 89 mL/min
GFR, Est African American: >89 mL/min
Glucose, Bld: 119 mg/dL — ABNORMAL HIGH (ref 70–99)
Potassium: 4.1 mEq/L (ref 3.5–5.3)
SODIUM: 142 meq/L (ref 135–145)

## 2013-09-24 LAB — VITAMIN D 25 HYDROXY (VIT D DEFICIENCY, FRACTURES): Vit D, 25-Hydroxy: 78 ng/mL (ref 30–89)

## 2013-09-24 LAB — TSH: TSH: 2.132 u[IU]/mL (ref 0.350–4.500)

## 2013-09-24 LAB — LIPID PANEL
CHOLESTEROL: 147 mg/dL (ref 0–200)
HDL: 40 mg/dL (ref >39)
LDL Cholesterol: 76 mg/dL (ref 0–99)
TRIGLYCERIDES: 154 mg/dL — AB (ref <150)
Total CHOL/HDL Ratio: 3.7 Ratio
VLDL: 31 mg/dL (ref 0–40)

## 2013-09-24 LAB — INSULIN, FASTING: Insulin fasting, serum: 122 u[IU]/mL — ABNORMAL HIGH (ref 3–28)

## 2013-09-24 LAB — MAGNESIUM: MAGNESIUM: 1.9 mg/dL (ref 1.5–2.5)

## 2013-09-28 ENCOUNTER — Encounter: Payer: Self-pay | Admitting: *Deleted

## 2013-10-12 ENCOUNTER — Other Ambulatory Visit: Payer: Self-pay | Admitting: Physician Assistant

## 2013-10-30 DIAGNOSIS — L570 Actinic keratosis: Secondary | ICD-10-CM | POA: Diagnosis not present

## 2013-10-30 DIAGNOSIS — Z85828 Personal history of other malignant neoplasm of skin: Secondary | ICD-10-CM | POA: Diagnosis not present

## 2013-10-30 DIAGNOSIS — D239 Other benign neoplasm of skin, unspecified: Secondary | ICD-10-CM | POA: Diagnosis not present

## 2013-10-30 DIAGNOSIS — L259 Unspecified contact dermatitis, unspecified cause: Secondary | ICD-10-CM | POA: Diagnosis not present

## 2013-10-30 DIAGNOSIS — D1801 Hemangioma of skin and subcutaneous tissue: Secondary | ICD-10-CM | POA: Diagnosis not present

## 2013-10-30 DIAGNOSIS — L821 Other seborrheic keratosis: Secondary | ICD-10-CM | POA: Diagnosis not present

## 2013-10-30 DIAGNOSIS — L909 Atrophic disorder of skin, unspecified: Secondary | ICD-10-CM | POA: Diagnosis not present

## 2013-10-30 DIAGNOSIS — L819 Disorder of pigmentation, unspecified: Secondary | ICD-10-CM | POA: Diagnosis not present

## 2013-10-30 DIAGNOSIS — L919 Hypertrophic disorder of the skin, unspecified: Secondary | ICD-10-CM | POA: Diagnosis not present

## 2013-12-28 ENCOUNTER — Encounter: Payer: Self-pay | Admitting: Emergency Medicine

## 2013-12-28 ENCOUNTER — Ambulatory Visit (INDEPENDENT_AMBULATORY_CARE_PROVIDER_SITE_OTHER): Payer: Medicare Other | Admitting: Emergency Medicine

## 2013-12-28 VITALS — BP 108/66 | HR 74 | Temp 98.2°F | Resp 18 | Ht 62.5 in | Wt 153.0 lb

## 2013-12-28 DIAGNOSIS — I10 Essential (primary) hypertension: Secondary | ICD-10-CM | POA: Diagnosis not present

## 2013-12-28 DIAGNOSIS — R7309 Other abnormal glucose: Secondary | ICD-10-CM | POA: Diagnosis not present

## 2013-12-28 DIAGNOSIS — E782 Mixed hyperlipidemia: Secondary | ICD-10-CM | POA: Diagnosis not present

## 2013-12-28 LAB — HEMOGLOBIN A1C
HEMOGLOBIN A1C: 6.1 % — AB (ref <5.7)
Mean Plasma Glucose: 128 mg/dL — ABNORMAL HIGH (ref <117)

## 2013-12-28 LAB — CBC WITH DIFFERENTIAL/PLATELET
BASOS ABS: 0.1 10*3/uL (ref 0.0–0.1)
BASOS PCT: 1 % (ref 0–1)
Eosinophils Absolute: 0.2 10*3/uL (ref 0.0–0.7)
Eosinophils Relative: 2 % (ref 0–5)
HCT: 41.3 % (ref 36.0–46.0)
Hemoglobin: 14 g/dL (ref 12.0–15.0)
Lymphocytes Relative: 30 % (ref 12–46)
Lymphs Abs: 2.4 10*3/uL (ref 0.7–4.0)
MCH: 27.6 pg (ref 26.0–34.0)
MCHC: 33.9 g/dL (ref 30.0–36.0)
MCV: 81.3 fL (ref 78.0–100.0)
Monocytes Absolute: 0.6 10*3/uL (ref 0.1–1.0)
Monocytes Relative: 7 % (ref 3–12)
NEUTROS ABS: 4.7 10*3/uL (ref 1.7–7.7)
Neutrophils Relative %: 60 % (ref 43–77)
PLATELETS: 329 10*3/uL (ref 150–400)
RBC: 5.08 MIL/uL (ref 3.87–5.11)
RDW: 14.5 % (ref 11.5–15.5)
WBC: 7.9 10*3/uL (ref 4.0–10.5)

## 2013-12-28 NOTE — Progress Notes (Signed)
Subjective:    Patient ID: Anna Caldwell, female    DOB: 15-Feb-1941, 73 y.o.   MRN: [id number]  HPI Comments: 73 yo WF presents for 3 month F/U for labile HTN with out any RX managed with exercise and diet, Cholesterol, Pre-Dm, D. Deficient. She is exercising routinely and eating healthy. She is working more in garden. She notes mild low right back pain with gardening. Tylenol helps with pain. She denies actual injury.  WBC             8.0   09/23/2013 HGB            13.3   09/23/2013 HCT            39.4   09/23/2013 PLT             275   09/23/2013 GLUCOSE         119   09/23/2013 CHOL            147   09/23/2013 TRIG            154   09/23/2013 HDL              40   09/23/2013 LDLCALC          76   09/23/2013 ALT              19   09/23/2013 AST              20   09/23/2013 NA              142   09/23/2013 K               4.1   09/23/2013 CL              106   09/23/2013 CREATININE     0.67   09/23/2013 BUN              16   09/23/2013 CO2              28   09/23/2013 TSH           2.132   09/23/2013 HGBA1C          5.8   09/23/2013 MICROALBUR     0.97   06/22/2013      Medication List       This list is accurate as of: 12/28/13  9:53 AM.  Always use your most recent med list.               aspirin 81 MG chewable tablet  Chew by mouth daily.     fluocinonide cream 0.05 %  Commonly known as:  LIDEX     KRILL OIL PO  Take by mouth daily. Two tablets daily.     levothyroxine 50 MCG tablet  Commonly known as:  SYNTHROID, LEVOTHROID  Takes 1/2 daily     OVER THE COUNTER MEDICATION  daily. [facility] brand allergy pill     VITAMIN D PO  Take 10,000 Int'l Units by mouth daily.        No Known Allergies  Past Medical History  Diagnosis Date  . Hypertension   . Hyperlipidemia   . Vitamin D deficiency   . Thyroid disease   . Arthritis   . Prediabetes       Review of Systems  Musculoskeletal: Positive for back pain.  All other systems reviewed and are negative.  BP  108/66  Pulse 74  Temp(Src) 98.2 F (36.8 C) (Temporal)  Resp 18  Ht 5' 2.5" (1.588 m)  Wt 153 lb (69.4 kg)  BMI 27.52 kg/m2     Objective:   Physical Exam  Nursing note and vitals reviewed. Constitutional: She is oriented to person, place, and time. She appears well-developed and well-nourished. No distress.  HENT:  Head: Normocephalic and atraumatic.  Right Ear: External ear normal.  Left Ear: External ear normal.  Nose: Nose normal.  Mouth/Throat: Oropharynx is clear and moist.  Eyes: Conjunctivae and EOM are normal.  Neck: Normal range of motion. Neck supple. No JVD present. No thyromegaly present.  Cardiovascular: Normal rate, regular rhythm, normal heart sounds and intact distal pulses.   Pulmonary/Chest: Effort normal and breath sounds normal.  Abdominal: Soft. Bowel sounds are normal. She exhibits no distension. There is no tenderness.  Musculoskeletal: Normal range of motion. She exhibits tenderness. She exhibits no edema.  + right> Left low back with SLR  Lymphadenopathy:    She has no cervical adenopathy.  Neurological: She is alert and oriented to person, place, and time. No cranial nerve deficit.  Skin: Skin is warm and dry. No rash noted. No erythema. No pallor.  Psychiatric: She has a normal mood and affect. Her behavior is normal. Judgment and thought content normal.          Assessment & Plan:  1.  3 month F/U for labile HTN, Cholesterol, Pre-Dm, D. Deficient. Needs healthy diet, cardio QD and obtain healthy weight. Check Labs, Check BP if >130/80 call office   2. ? Sciatica- heat/ stretch/ Ice explained. OTC Aleve AD, w/c with results

## 2013-12-28 NOTE — Patient Instructions (Signed)

## 2013-12-29 LAB — BASIC METABOLIC PANEL WITH GFR
BUN: 12 mg/dL (ref 6–23)
CHLORIDE: 105 meq/L (ref 96–112)
CO2: 27 mEq/L (ref 19–32)
Calcium: 9.5 mg/dL (ref 8.4–10.5)
Creat: 0.73 mg/dL (ref 0.50–1.10)
GFR, EST NON AFRICAN AMERICAN: 83 mL/min
GFR, Est African American: >89 mL/min
Glucose, Bld: 94 mg/dL (ref 70–99)
Potassium: 4.4 mEq/L (ref 3.5–5.3)
Sodium: 141 mEq/L (ref 135–145)

## 2013-12-29 LAB — HEPATIC FUNCTION PANEL
ALT: 18 U/L (ref 0–35)
AST: 19 U/L (ref 0–37)
Albumin: 4.2 g/dL (ref 3.5–5.2)
Alkaline Phosphatase: 81 U/L (ref 39–117)
BILIRUBIN DIRECT: 0.1 mg/dL (ref 0.0–0.3)
Indirect Bilirubin: 0.3 mg/dL (ref 0.2–1.2)
Total Bilirubin: 0.4 mg/dL (ref 0.2–1.2)
Total Protein: 6.8 g/dL (ref 6.0–8.3)

## 2013-12-29 LAB — LIPID PANEL
CHOLESTEROL: 169 mg/dL (ref 0–200)
HDL: 48 mg/dL (ref >39)
LDL CALC: 96 mg/dL (ref 0–99)
Total CHOL/HDL Ratio: 3.5 Ratio
Triglycerides: 126 mg/dL (ref <150)
VLDL: 25 mg/dL (ref 0–40)

## 2013-12-29 LAB — INSULIN, FASTING: Insulin fasting, serum: 14 u[IU]/mL (ref 3–28)

## 2014-03-31 ENCOUNTER — Encounter: Payer: Self-pay | Admitting: Internal Medicine

## 2014-03-31 ENCOUNTER — Ambulatory Visit: Payer: Self-pay | Admitting: Internal Medicine

## 2014-04-08 ENCOUNTER — Ambulatory Visit (INDEPENDENT_AMBULATORY_CARE_PROVIDER_SITE_OTHER): Payer: Medicare Other | Admitting: Internal Medicine

## 2014-04-08 ENCOUNTER — Encounter: Payer: Self-pay | Admitting: Internal Medicine

## 2014-04-08 VITALS — BP 120/68 | HR 68 | Temp 98.2°F | Resp 16 | Ht 62.0 in | Wt 153.4 lb

## 2014-04-08 DIAGNOSIS — E559 Vitamin D deficiency, unspecified: Secondary | ICD-10-CM

## 2014-04-08 DIAGNOSIS — I10 Essential (primary) hypertension: Secondary | ICD-10-CM

## 2014-04-08 DIAGNOSIS — H43819 Vitreous degeneration, unspecified eye: Secondary | ICD-10-CM | POA: Diagnosis not present

## 2014-04-08 DIAGNOSIS — R7309 Other abnormal glucose: Secondary | ICD-10-CM

## 2014-04-08 DIAGNOSIS — H33309 Unspecified retinal break, unspecified eye: Secondary | ICD-10-CM | POA: Diagnosis not present

## 2014-04-08 DIAGNOSIS — Z1331 Encounter for screening for depression: Secondary | ICD-10-CM | POA: Diagnosis not present

## 2014-04-08 DIAGNOSIS — E039 Hypothyroidism, unspecified: Secondary | ICD-10-CM

## 2014-04-08 DIAGNOSIS — Z0000 Encounter for general adult medical examination without abnormal findings: Secondary | ICD-10-CM | POA: Diagnosis not present

## 2014-04-08 DIAGNOSIS — Z79899 Other long term (current) drug therapy: Secondary | ICD-10-CM | POA: Diagnosis not present

## 2014-04-08 DIAGNOSIS — E785 Hyperlipidemia, unspecified: Secondary | ICD-10-CM | POA: Diagnosis not present

## 2014-04-08 DIAGNOSIS — H35379 Puckering of macula, unspecified eye: Secondary | ICD-10-CM | POA: Diagnosis not present

## 2014-04-08 DIAGNOSIS — Z1212 Encounter for screening for malignant neoplasm of rectum: Secondary | ICD-10-CM

## 2014-04-08 DIAGNOSIS — R7303 Prediabetes: Secondary | ICD-10-CM

## 2014-04-08 DIAGNOSIS — Z23 Encounter for immunization: Secondary | ICD-10-CM | POA: Diagnosis not present

## 2014-04-08 DIAGNOSIS — H2510 Age-related nuclear cataract, unspecified eye: Secondary | ICD-10-CM | POA: Diagnosis not present

## 2014-04-08 LAB — CBC WITH DIFFERENTIAL/PLATELET
BASOS PCT: 1 % (ref 0–1)
Basophils Absolute: 0.1 10*3/uL (ref 0.0–0.1)
EOS PCT: 3 % (ref 0–5)
Eosinophils Absolute: 0.2 10*3/uL (ref 0.0–0.7)
HCT: 42.7 % (ref 36.0–46.0)
HEMOGLOBIN: 14.5 g/dL (ref 12.0–15.0)
LYMPHS ABS: 2.3 10*3/uL (ref 0.7–4.0)
Lymphocytes Relative: 30 % (ref 12–46)
MCH: 27.7 pg (ref 26.0–34.0)
MCHC: 34 g/dL (ref 30.0–36.0)
MCV: 81.6 fL (ref 78.0–100.0)
MONO ABS: 0.6 10*3/uL (ref 0.1–1.0)
MONOS PCT: 8 % (ref 3–12)
NEUTROS PCT: 58 % (ref 43–77)
Neutro Abs: 4.4 10*3/uL (ref 1.7–7.7)
Platelets: 307 10*3/uL (ref 150–400)
RBC: 5.23 MIL/uL — AB (ref 3.87–5.11)
RDW: 14.1 % (ref 11.5–15.5)
WBC: 7.6 10*3/uL (ref 4.0–10.5)

## 2014-04-08 NOTE — Patient Instructions (Signed)
Recommend the book "The END of DIETING" by Dr Baker Janus   and the book "The END of DIABETES " by Dr Excell Seltzer  At [email or web address] - get book & Audio CD's      Being diabetic has a  300% increased risk for heart attack, stroke, cancer, and alzheimer- type vascular dementia. It is very important that you work harder with diet by avoiding all foods that are white except chicken & fish. Avoid white rice (brown & wild rice is OK), white potatoes (sweetpotatoes in moderation is OK), White bread or wheat bread or anything made out of white flour like bagels, donuts, rolls, buns, biscuits, cakes, pastries, cookies, pizza crust, and pasta (made from white flour & egg whites) - vegetarian pasta or spinach or wheat pasta is OK. Multigrain breads like Arnold's or [employer], or multigrain sandwich thins or flatbreads.  Diet, exercise and weight loss can reverse and cure diabetes in the early stages.  Diet, exercise and weight loss is very important in the control and prevention of complications of diabetes which affects every system in your body, ie. Brain - dementia/stroke, eyes - glaucoma/blindness, heart - heart attack/heart failure, kidneys - dialysis, stomach - gastric paralysis, intestines - malabsorption, nerves - severe painful neuritis, circulation - gangrene & loss of a leg(s), and finally cancer and Alzheimers.    I recommend avoid fried & greasy foods,  sweets/candy, white rice (brown or wild rice or Quinoa is OK), white potatoes (sweet potatoes are OK) - anything made from white flour - bagels, doughnuts, rolls, buns, biscuits,white and wheat breads, pizza crust and traditional pasta made of white flour & egg white(vegetarian pasta or spinach or wheat pasta is OK).  Multi-grain bread is OK - like multi-grain flat bread or sandwich thins. Avoid alcohol in excess. Exercise is also important.    Eat all the vegetables you want - avoid meat, especially red meat and dairy - especially cheese.  Cheese  is the most concentrated form of trans-fats which is the worst thing to clog up our arteries. Veggie cheese is OK which can be found in the fresh produce section at [employer] or [employer] or [employer]  Preventive Care for Adults A healthy lifestyle and preventive care can promote health and wellness. Preventive health guidelines for women include the following key practices.  A routine yearly physical is a good way to check with your health care provider about your health and preventive screening. It is a chance to share any concerns and updates on your health and to receive a thorough exam.  Visit your dentist for a routine exam and preventive care every 6 months. Brush your teeth twice a day and floss once a day. Good oral hygiene prevents tooth decay and gum disease.  The frequency of eye exams is based on your age, health, family medical history, use of contact lenses, and other factors. Follow your health care provider's recommendations for frequency of eye exams.  Eat a healthy diet. Foods like vegetables, fruits, whole grains, low-fat dairy products, and lean protein foods contain the nutrients you need without too many calories. Decrease your intake of foods high in solid fats, added sugars, and salt. Eat the right amount of calories for you.Get information about a proper diet from your health care provider, if necessary.  Regular physical exercise is one of the most important things you can do for your health. Most adults should get at least 150 minutes of moderate-intensity exercise (any activity that increases  your heart rate and causes you to sweat) each week. In addition, most adults need muscle-strengthening exercises on 2 or more days a week.  Maintain a healthy weight. The body mass index (BMI) is a screening tool to identify possible weight problems. It provides an estimate of body fat based on height and weight. Your health care provider can find your BMI and can help you  achieve or maintain a healthy weight.For adults 20 years and older:  A BMI below 18.5 is considered underweight.  A BMI of 18.5 to 24.9 is normal.  A BMI of 25 to 29.9 is considered overweight.  A BMI of 30 and above is considered obese.  Maintain normal blood lipids and cholesterol levels by exercising and minimizing your intake of saturated fat. Eat a balanced diet with plenty of fruit and vegetables. Blood tests for lipids and cholesterol should begin at age 43 and be repeated every 5 years. If your lipid or cholesterol levels are high, you are over 50, or you are at high risk for heart disease, you may need your cholesterol levels checked more frequently.Ongoing high lipid and cholesterol levels should be treated with medicines if diet and exercise are not working.  If you smoke, find out from your health care provider how to quit. If you do not use tobacco, do not start.  Lung cancer screening is recommended for adults aged 40-80 years who are at high risk for developing lung cancer because of a history of smoking. A yearly low-dose CT scan of the lungs is recommended for people who have at least a 30-pack-year history of smoking and are a current smoker or have quit within the past 15 years. A pack year of smoking is smoking an average of 1 pack of cigarettes a day for 1 year (for example: 1 pack a day for 30 years or 2 packs a day for 15 years). Yearly screening should continue until the smoker has stopped smoking for at least 15 years. Yearly screening should be stopped for people who develop a health problem that would prevent them from having lung cancer treatment.  If you are pregnant, do not drink alcohol. If you are breastfeeding, be very cautious about drinking alcohol. If you are not pregnant and choose to drink alcohol, do not have more than 1 drink per day. One drink is considered to be 12 ounces (355 mL) of beer, 5 ounces (148 mL) of wine, or 1.5 ounces (44 mL) of liquor.  Avoid  use of street drugs. Do not share needles with anyone. Ask for help if you need support or instructions about stopping the use of drugs.  High blood pressure causes heart disease and increases the risk of stroke. Your blood pressure should be checked at least every 1 to 2 years. Ongoing high blood pressure should be treated with medicines if weight loss and exercise do not work.  If you are 74-43 years old, ask your health care provider if you should take aspirin to prevent strokes.  Diabetes screening involves taking a blood sample to check your fasting blood sugar level. This should be done once every 3 years, after age 105, if you are within normal weight and without risk factors for diabetes. Testing should be considered at a younger age or be carried out more frequently if you are overweight and have at least 1 risk factor for diabetes.  Breast cancer screening is essential preventive care for women. You should practice "breast self-awareness." This means understanding the  normal appearance and feel of your breasts and may include breast self-examination. Any changes detected, no matter how small, should be reported to a health care provider. Women in their 77s and 30s should have a clinical breast exam (CBE) by a health care provider as part of a regular health exam every 1 to 3 years. After age 52, women should have a CBE every year. Starting at age 59, women should consider having a mammogram (breast X-ray test) every year. Women who have a family history of breast cancer should talk to their health care provider about genetic screening. Women at a high risk of breast cancer should talk to their health care providers about having an MRI and a mammogram every year.  Breast cancer gene (BRCA)-related cancer risk assessment is recommended for women who have family members with BRCA-related cancers. BRCA-related cancers include breast, ovarian, tubal, and peritoneal cancers. Having family members with  these cancers may be associated with an increased risk for harmful changes (mutations) in the breast cancer genes BRCA1 and BRCA2. Results of the assessment will determine the need for genetic counseling and BRCA1 and BRCA2 testing.  Routine pelvic exams to screen for cancer are no longer recommended for nonpregnant women who are considered low risk for cancer of the pelvic organs (ovaries, uterus, and vagina) and who do not have symptoms. Ask your health care provider if a screening pelvic exam is right for you.  If you have had past treatment for cervical cancer or a condition that could lead to cancer, you need Pap tests and screening for cancer for at least 20 years after your treatment. If Pap tests have been discontinued, your risk factors (such as having a new sexual partner) need to be reassessed to determine if screening should be resumed. Some women have medical problems that increase the chance of getting cervical cancer. In these cases, your health care provider may recommend more frequent screening and Pap tests.  The HPV test is an additional test that may be used for cervical cancer screening. The HPV test looks for the virus that can cause the cell changes on the cervix. The cells collected during the Pap test can be tested for HPV. The HPV test could be used to screen women aged 43 years and older, and should be used in women of any age who have unclear Pap test results. After the age of 50, women should have HPV testing at the same frequency as a Pap test.  Colorectal cancer can be detected and often prevented. Most routine colorectal cancer screening begins at the age of 30 years and continues through age 66 years. However, your health care provider may recommend screening at an earlier age if you have risk factors for colon cancer. On a yearly basis, your health care provider may provide home test kits to check for hidden blood in the stool. Use of a small camera at the end of a tube, to  directly examine the colon (sigmoidoscopy or colonoscopy), can detect the earliest forms of colorectal cancer. Talk to your health care provider about this at age 43, when routine screening begins. Direct exam of the colon should be repeated every 5-10 years through age 56 years, unless early forms of pre-cancerous polyps or small growths are found.  People who are at an increased risk for hepatitis B should be screened for this virus. You are considered at high risk for hepatitis B if:  You were born in a country where hepatitis B occurs  often. Talk with your health care provider about which countries are considered high risk.  Your parents were born in a high-risk country and you have not received a shot to protect against hepatitis B (hepatitis B vaccine).  You have HIV or AIDS.  You use needles to inject street drugs.  You live with, or have sex with, someone who has hepatitis B.  You get hemodialysis treatment.  You take certain medicines for conditions like cancer, organ transplantation, and autoimmune conditions.  Hepatitis C blood testing is recommended for all people born from [birth year] through 1965 and any individual with known risks for hepatitis C.  Practice safe sex. Use condoms and avoid high-risk sexual practices to reduce the spread of sexually transmitted infections (STIs). STIs include gonorrhea, chlamydia, syphilis, trichomonas, herpes, HPV, and human immunodeficiency virus (HIV). Herpes, HIV, and HPV are viral illnesses that have no cure. They can result in disability, cancer, and death.  You should be screened for sexually transmitted illnesses (STIs) including gonorrhea and chlamydia if:  You are sexually active and are younger than 24 years.  You are older than 24 years and your health care provider tells you that you are at risk for this type of infection.  Your sexual activity has changed since you were last screened and you are at an increased risk for chlamydia or  gonorrhea. Ask your health care provider if you are at risk.  If you are at risk of being infected with HIV, it is recommended that you take a prescription medicine daily to prevent HIV infection. This is called preexposure prophylaxis (PrEP). You are considered at risk if:  You are a heterosexual woman, are sexually active, and are at increased risk for HIV infection.  You take drugs by injection.  You are sexually active with a partner who has HIV.  Talk with your health care provider about whether you are at high risk of being infected with HIV. If you choose to begin PrEP, you should first be tested for HIV. You should then be tested every 3 months for as long as you are taking PrEP.  Osteoporosis is a disease in which the bones lose minerals and strength with aging. This can result in serious bone fractures or breaks. The risk of osteoporosis can be identified using a bone density scan. Women ages 35 years and over and women at risk for fractures or osteoporosis should discuss screening with their health care providers. Ask your health care provider whether you should take a calcium supplement or vitamin D to reduce the rate of osteoporosis.  Menopause can be associated with physical symptoms and risks. Hormone replacement therapy is available to decrease symptoms and risks. You should talk to your health care provider about whether hormone replacement therapy is right for you.  Use sunscreen. Apply sunscreen liberally and repeatedly throughout the day. You should seek shade when your shadow is shorter than you. Protect yourself by wearing long sleeves, pants, a wide-brimmed hat, and sunglasses year round, whenever you are outdoors.  Once a month, do a whole body skin exam, using a mirror to look at the skin on your back. Tell your health care provider of new moles, moles that have irregular borders, moles that are larger than a pencil eraser, or moles that have changed in shape or  color.  Stay current with required vaccines (immunizations).  Influenza vaccine. All adults should be immunized every year.  Tetanus, diphtheria, and acellular pertussis (Td, Tdap) vaccine. Pregnant women should receive  1 dose of Tdap vaccine during each pregnancy. The dose should be obtained regardless of the length of time since the last dose. Immunization is preferred during the 27th-36th week of gestation. An adult who has not previously received Tdap or who does not know her vaccine status should receive 1 dose of Tdap. This initial dose should be followed by tetanus and diphtheria toxoids (Td) booster doses every 10 years. Adults with an unknown or incomplete history of completing a 3-dose immunization series with Td-containing vaccines should begin or complete a primary immunization series including a Tdap dose. Adults should receive a Td booster every 10 years.  Varicella vaccine. An adult without evidence of immunity to varicella should receive 2 doses or a second dose if she has previously received 1 dose. Pregnant females who do not have evidence of immunity should receive the first dose after pregnancy. This first dose should be obtained before leaving the health care facility. The second dose should be obtained 4-8 weeks after the first dose.  Human papillomavirus (HPV) vaccine. Females aged 13-26 years who have not received the vaccine previously should obtain the 3-dose series. The vaccine is not recommended for use in pregnant females. However, pregnancy testing is not needed before receiving a dose. If a female is found to be pregnant after receiving a dose, no treatment is needed. In that case, the remaining doses should be delayed until after the pregnancy. Immunization is recommended for any person with an immunocompromised condition through the age of 32 years if she did not get any or all doses earlier. During the 3-dose series, the second dose should be obtained 4-8 weeks after the  first dose. The third dose should be obtained 24 weeks after the first dose and 16 weeks after the second dose.  Zoster vaccine. One dose is recommended for adults aged 26 years or older unless certain conditions are present.  Measles, mumps, and rubella (MMR) vaccine. Adults born before [birth year] generally are considered immune to measles and mumps. Adults born in [birth year] or later should have 1 or more doses of MMR vaccine unless there is a contraindication to the vaccine or there is laboratory evidence of immunity to each of the three diseases. A routine second dose of MMR vaccine should be obtained at least 28 days after the first dose for students attending postsecondary schools, health care workers, or international travelers. People who received inactivated measles vaccine or an unknown type of measles vaccine during 1963-1967 should receive 2 doses of MMR vaccine. People who received inactivated mumps vaccine or an unknown type of mumps vaccine before 1979 and are at high risk for mumps infection should consider immunization with 2 doses of MMR vaccine. For females of childbearing age, rubella immunity should be determined. If there is no evidence of immunity, females who are not pregnant should be vaccinated. If there is no evidence of immunity, females who are pregnant should delay immunization until after pregnancy. Unvaccinated health care workers born before [birth year] who lack laboratory evidence of measles, mumps, or rubella immunity or laboratory confirmation of disease should consider measles and mumps immunization with 2 doses of MMR vaccine or rubella immunization with 1 dose of MMR vaccine.  Pneumococcal 13-valent conjugate (PCV13) vaccine. When indicated, a person who is uncertain of her immunization history and has no record of immunization should receive the PCV13 vaccine. An adult aged 47 years or older who has certain medical conditions and has not been previously immunized should receive 1 dose of  PCV13 vaccine. This PCV13 should be followed with a dose of pneumococcal polysaccharide (PPSV23) vaccine. The PPSV23 vaccine dose should be obtained at least 8 weeks after the dose of PCV13 vaccine. An adult aged 66 years or older who has certain medical conditions and previously received 1 or more doses of PPSV23 vaccine should receive 1 dose of PCV13. The PCV13 vaccine dose should be obtained 1 or more years after the last PPSV23 vaccine dose.  Pneumococcal polysaccharide (PPSV23) vaccine. When PCV13 is also indicated, PCV13 should be obtained first. All adults aged 41 years and older should be immunized. An adult younger than age 20 years who has certain medical conditions should be immunized. Any person who resides in a nursing home or long-term care facility should be immunized. An adult smoker should be immunized. People with an immunocompromised condition and certain other conditions should receive both PCV13 and PPSV23 vaccines. People with human immunodeficiency virus (HIV) infection should be immunized as soon as possible after diagnosis. Immunization during chemotherapy or radiation therapy should be avoided. Routine use of PPSV23 vaccine is not recommended for American Indians, [place] Natives, or people younger than 65 years unless there are medical conditions that require PPSV23 vaccine. When indicated, people who have unknown immunization and have no record of immunization should receive PPSV23 vaccine. One-time revaccination 5 years after the first dose of PPSV23 is recommended for people aged 19-64 years who have chronic kidney failure, nephrotic syndrome, asplenia, or immunocompromised conditions. People who received 1-2 doses of PPSV23 before age 33 years should receive another dose of PPSV23 vaccine at age 19 years or later if at least 5 years have passed since the previous dose. Doses of PPSV23 are not needed for people immunized with PPSV23 at or after age 15 years.  Meningococcal vaccine.  Adults with asplenia or persistent complement component deficiencies should receive 2 doses of quadrivalent meningococcal conjugate (MenACWY-D) vaccine. The doses should be obtained at least 2 months apart. Microbiologists working with certain meningococcal bacteria, [place] recruits, people at risk during an outbreak, and people who travel to or live in countries with a high rate of meningitis should be immunized. A first-year college student up through age 52 years who is living in a residence hall should receive a dose if she did not receive a dose on or after her 16th birthday. Adults who have certain high-risk conditions should receive one or more doses of vaccine.  Hepatitis A vaccine. Adults who wish to be protected from this disease, have certain high-risk conditions, work with hepatitis A-infected animals, work in hepatitis A research labs, or travel to or work in countries with a high rate of hepatitis A should be immunized. Adults who were previously unvaccinated and who anticipate close contact with an international adoptee during the first 60 days after arrival in the [place] from a country with a high rate of hepatitis A should be immunized.  Hepatitis B vaccine. Adults who wish to be protected from this disease, have certain high-risk conditions, may be exposed to blood or other infectious body fluids, are household contacts or sex partners of hepatitis B positive people, are clients or workers in certain care facilities, or travel to or work in countries with a high rate of hepatitis B should be immunized.  Haemophilus influenzae type b (Hib) vaccine. A previously unvaccinated person with asplenia or sickle cell disease or having a scheduled splenectomy should receive 1 dose of Hib vaccine. Regardless of previous immunization, a recipient of a hematopoietic stem  cell transplant should receive a 3-dose series 6-12 months after her successful transplant. Hib vaccine is not recommended for  adults with HIV infection. Preventive Services / Frequency  Ages 65 years and over  Blood pressure check.** / Every 1 to 2 years.  Lipid and cholesterol check.** / Every 5 years beginning at age 20 years.  Lung cancer screening. / Every year if you are aged 55-80 years and have a 30-pack-year history of smoking and currently smoke or have quit within the past 15 years. Yearly screening is stopped once you have quit smoking for at least 15 years or develop a health problem that would prevent you from having lung cancer treatment.  Clinical breast exam.** / Every year after age 40 years.  BRCA-related cancer risk assessment.** / For women who have family members with a BRCA-related cancer (breast, ovarian, tubal, or peritoneal cancers).  Mammogram.** / Every year beginning at age 40 years and continuing for as long as you are in good health. Consult with your health care provider.  Pap test.** / Every 3 years starting at age 30 years through age 65 or 70 years with 3 consecutive normal Pap tests. Testing can be stopped between 65 and 70 years with 3 consecutive normal Pap tests and no abnormal Pap or HPV tests in the past 10 years.  HPV screening.** / Every 3 years from ages 30 years through ages 65 or 70 years with a history of 3 consecutive normal Pap tests. Testing can be stopped between 65 and 70 years with 3 consecutive normal Pap tests and no abnormal Pap or HPV tests in the past 10 years.  Fecal occult blood test (FOBT) of stool. / Every year beginning at age 50 years and continuing until age 75 years. You may not need to do this test if you get a colonoscopy every 10 years.  Flexible sigmoidoscopy or colonoscopy.** / Every 5 years for a flexible sigmoidoscopy or every 10 years for a colonoscopy beginning at age 50 years and continuing until age 75 years.  Hepatitis C blood test.** / For all people born from 1945 through 1965 and any individual with known risks for hepatitis  C.  Osteoporosis screening.** / A one-time screening for women ages 65 years and over and women at risk for fractures or osteoporosis.  Skin self-exam. / Monthly.  Influenza vaccine. / Every year.  Tetanus, diphtheria, and acellular pertussis (Tdap/Td) vaccine.** / 1 dose of Td every 10 years.  Varicella vaccine.** / Consult your health care provider.  Zoster vaccine.** / 1 dose for adults aged 60 years or older.  Pneumococcal 13-valent conjugate (PCV13) vaccine.** / Consult your health care provider.  Pneumococcal polysaccharide (PPSV23) vaccine.** / 1 dose for all adults aged 65 years and older.  Meningococcal vaccine.** / Consult your health care provider.  Hepatitis A vaccine.** / Consult your health care provider.  Hepatitis B vaccine.** / Consult your health care provider.  Haemophilus influenzae type b (Hib) vaccine.** / Consult your health care provider.  

## 2014-04-08 NOTE — Progress Notes (Signed)
Patient ID: Anna Caldwell, female   DOB: [birth year], 73 y.o.   MRN: [id number] [facility] VISIT AND CPE  Assessment:   1. Routine general medical examination at a health care facility - Annual Medicare wellness Screening  2. Need for prophylactic vaccination against Streptococcus pneumoniae (pneumococcus)  - Pneumococcal polysaccharide vaccine 23-valent greater than or equal to 2yo subcutaneous/IM - DT Vaccine greater than 7yo IM  3. Need for prophylactic vaccination with tetanus-diphtheria (TD)  4. Essential hypertension  - Microalbumin / creatinine urine ratio - EKG 12-Lead - [place], RETROPERITNL ABD,  LTD - TSH  5. Hyperlipidemia  - Lipid panel  6. Prediabetes  - Hemoglobin A1c - Insulin, fasting  7. Vitamin D deficiency  - Vit D  25 hydroxy (rtn osteoporosis monitoring)  8. Encounter for long-term (current) use of other medications  - Urine Microscopic - CBC with Differential - BASIC METABOLIC PANEL WITH GFR - Hepatic function panel - Magnesium  9. Hypothyroidism   10. Screening for malignant neoplasm of the rectum  - POC Hemoccult Bld/Stl (3-Cd Home Screen); Future  11. Screening for depression   12. Screening for Falls and Auditory deficits   Plan:   During the course of the visit the patient was educated and counseled about appropriate screening and preventive services including:    Pneumococcal vaccine   Influenza vaccine  Td vaccine  Screening electrocardiogram  Bone densitometry screening  Colorectal cancer screening  Diabetes screening  Glaucoma screening  Nutrition counseling   Advanced directives: requested  Screening recommendations, referrals: Vaccinations: DT vaccine 04/08/2014 Influenza vaccine Not Available Pneumococcal vaccine 11/18/2006 Prevnar 04/08/2014 Shingles vaccine 01/29/2007 Hep B vaccine not indicated  Nutrition assessed and recommended  Colonoscopy 2008 Recommended yearly  ophthalmology/optometry visit for glaucoma screening and checkup Recommended yearly dental visit for hygiene and checkup Advanced directives - yes  Conditions/risks identified: BMI: Discussed weight loss, diet, and increase physical activity.  Increase physical activity: AHA recommends 150 minutes of physical activity a week.  Medications reviewed PreDiabetes is at goal, ACE/ARB therapy:  Urinary Incontinence is not an issue: discussed non pharmacology and pharmacology options.  Fall risk: low- discussed PT, home fall assessment, medications.    Subjective:  Anna Caldwell is a 73 y.o. female who presents for Medicare Annual Wellness Visit and complete physical.  Date of last medicare wellness visit is unknown.  She has had elevated blood pressure since 2012. Her blood pressure has been controlled at home, & today their BP is BP: 120/68 mmHg She does not workout. She denies chest pain, shortness of breath, dizziness.  She is not on cholesterol medication and denies myalgias. Her cholesterol is at goal. The cholesterol last visit was:   Lab Results  Component Value Date   CHOL 169 12/28/2013   HDL 48 12/28/2013   TRIG 126 12/28/2013   CHOLHDL 3.5 12/28/2013   She has had diabetes for 4 years (2011). She has been working on diet and exercise for  prediabetes, and denies foot ulcerations, hyperglycemia, paresthesia of the feet, polydipsia, polyuria and visual disturbances. Last A1C in the office was:  Lab Results  Component Value Date   HGBA1C 6.1* 12/28/2013   Patient is on Vitamin D supplement.  (13 in 2008) Lab Results  Component Value Date   VD25OH 78 09/23/2013     Names of Other Physician/Practitioners you currently use: 1. [facility] here for primary care 2. Dr Satira Sark, eye doctor, last visit 2015 3. Dr Gordy Levan, dentist,  last visit 2015   Patient Care Team: Unk Pinto, MD as PCP - General (Internal Medicine) Inda Castle, MD as  Consulting Physician (Gastroenterology) Griffith Citron, MD as Consulting Physician (Ophthalmology)  Medication Review: Medication Sig  . aspirin 81 MG chewable tablet Chew by mouth daily.  . Cholecalciferol (VITAMIN D PO) Take 10,000 Int'l Units by mouth daily.  . fluocinonide cream (LIDEX) 0.05 %   . KRILL OIL PO Take by mouth daily. Two tablets daily.  Marland Kitchen levothyroxine (SYNTHROID, LEVOTHROID) 50 MCG tablet Takes 1/2 daily  . OVER THE COUNTER MEDICATION daily. [facility] brand allergy pill   Current Problems (verified) Patient Active Problem List   Diagnosis Date Noted  . Screening for malignant neoplasm of the rectum 04/08/2014  . Encounter for long-term (current) use of other medications 06/22/2013  . Hypertension   . Hyperlipidemia   . Vitamin D deficiency   . Hypothyroidism   . Prediabetes    Screening Tests Health Maintenance  Topic Date Due  . Tetanus/tdap  07/16/2012  . Influenza Vaccine  02/13/2014  . Mammogram  06/23/2015  . Colonoscopy  01/12/2017  . Pneumococcal Polysaccharide Vaccine Age 86 And Over  Completed  . Zostavax  Completed   Immunization History  Administered Date(s) Administered  . DT 04/08/2014  . Pneumococcal Polysaccharide-23 11/18/2006, 04/08/2014  . Td 07/16/2002  . Zoster 01/29/2007   Preventative care: Last colonoscopy: 2008  Prior vaccinations: TD : 04/08/2014  Influenza: NA  Pneumococcal: 11/18/2006 Prevnar 04/08/2014 Shingles/Zostavax: 01/29/07  History reviewed: allergies, current medications, past family history, past medical history, past social history, past surgical history and problem list   Risk Factors: Tobacco History  Substance Use Topics  . Smoking status: Never Smoker   . Smokeless tobacco: Never Used  . Alcohol Use: Yes     Comment: rarely   She does not smoke.  Patient is not a former smoker. Are there smokers in your home (other than you)?  No  Alcohol Current alcohol use: social drinker  Caffeine Current  caffeine use: coffee 2-3 /day  Exercise Current exercise: none  Nutrition/Diet Current diet: in general, a "healthy" diet    Cardiac risk factors: advanced age (older than 20 for men, 36 for women), dyslipidemia, hypertension and sedentary lifestyle.  Depression Screen (Note: if answer to either of the following is "Yes", a more complete depression screening is indicated)   Q1: Over the past two weeks, have you felt down, depressed or hopeless? No  Q2: Over the past two weeks, have you felt little interest or pleasure in doing things? No  Have you lost interest or pleasure in daily life? No  Do you often feel hopeless? No  Do you cry easily over simple problems? No  Activities of Daily Living In your present state of health, do you have any difficulty performing the following activities?:  Driving? No Managing money?  No Feeding yourself? No Getting from bed to chair? No Climbing a flight of stairs? No Preparing food and eating?: No Bathing or showering? No Getting dressed: No Getting to the toilet? No Using the toilet:No Moving around from place to place: No In the past year have you fallen or had a near fall?:No   Are you sexually active?  Yes  Do you have more than one partner?  No  Vision Difficulties: No  Hearing Difficulties: No Do you often ask people to speak up or repeat themselves? No Do you experience ringing or noises in your ears? No Do you  have difficulty understanding soft or whispered voices? Sometimes.  Cognition  Do you feel that you have a problem with memory?No  Do you often misplace items? No  Do you feel safe at home?  Yes  Advanced directives Does patient have a [place]? Yes Does patient have a Living Will? Yes   Objective:     Blood pressure 120/68, pulse 68, temperature 98.2 F (36.8 C), temperature source Temporal, resp. rate 16, height 5\' 2"  (1.575 m), weight 153 lb 6.4 oz (69.582 kg). Body mass index is 28.05  kg/(m^2).  General appearance: alert, no distress, WD/WN, female Cognitive Testing  Alert? Yes  Normal Appearance?Yes  Oriented to person? Yes  Place? Yes   Time? Yes  Recall of three objects?  Yes  Can perform simple calculations? Yes  Displays appropriate judgment? Yes  Can read the correct time from a watch/clock?Yes  HEENT: normocephalic, sclerae anicteric, TMs pearly, nares patent, no discharge or erythema, pharynx normal Oral cavity: MMM, no lesions Neck: supple, no lymphadenopathy, no thyromegaly, no masses Heart: RRR, normal S1, S2, no murmurs Lungs: CTA bilaterally, no wheezes, rhonchi, or rales Abdomen: +bs, soft, non tender, non distended, no masses, no hepatomegaly, no splenomegaly Musculoskeletal: nontender, no swelling, no obvious deformity Extremities: no edema, no cyanosis, no clubbing Pulses: 2+ symmetric, upper and lower extremities, normal cap refill Neurological: alert, oriented x 3, CN2-12 intact, strength normal upper extremities and lower extremities, sensation normal throughout, DTRs 2+ throughout, no cerebellar signs, gait normal Psychiatric: normal affect, behavior normal, pleasant   Medicare Attestation I have personally reviewed: The patient's medical and social history Their use of alcohol, tobacco or illicit drugs Their current medications and supplements The patient's functional ability including ADLs,fall risks, home safety risks, cognitive, and hearing and visual impairment Diet and physical activities Evidence for depression or mood disorders  The patient's weight, height, BMI, and visual acuity have been recorded in the chart.  I have made referrals, counseling, and provided education to the patient based on review of the above and I have provided the patient with a written personalized care plan for preventive services.    Divine Hansley DAVID, MD   04/08/2014

## 2014-04-09 LAB — MICROALBUMIN / CREATININE URINE RATIO
Creatinine, Urine: 241.2 mg/dL
Microalb Creat Ratio: 5 mg/g (ref 0.0–30.0)
Microalb, Ur: 1.2 mg/dL (ref <2.0)

## 2014-04-09 LAB — BASIC METABOLIC PANEL WITH GFR
BUN: 17 mg/dL (ref 6–23)
CALCIUM: 9.5 mg/dL (ref 8.4–10.5)
CO2: 25 meq/L (ref 19–32)
CREATININE: 0.78 mg/dL (ref 0.50–1.10)
Chloride: 107 mEq/L (ref 96–112)
GFR, EST AFRICAN AMERICAN: 88 mL/min
GFR, Est Non African American: 76 mL/min
Glucose, Bld: 92 mg/dL (ref 70–99)
Potassium: 4.3 mEq/L (ref 3.5–5.3)
SODIUM: 143 meq/L (ref 135–145)

## 2014-04-09 LAB — URINALYSIS, MICROSCOPIC ONLY
Bacteria, UA: NONE SEEN
CASTS: NONE SEEN
Crystals: NONE SEEN

## 2014-04-09 LAB — MAGNESIUM: MAGNESIUM: 1.8 mg/dL (ref 1.5–2.5)

## 2014-04-09 LAB — HEPATIC FUNCTION PANEL
ALT: 21 U/L (ref 0–35)
AST: 21 U/L (ref 0–37)
Albumin: 4.4 g/dL (ref 3.5–5.2)
Alkaline Phosphatase: 88 U/L (ref 39–117)
BILIRUBIN DIRECT: 0.1 mg/dL (ref 0.0–0.3)
Indirect Bilirubin: 0.3 mg/dL (ref 0.2–1.2)
TOTAL PROTEIN: 7 g/dL (ref 6.0–8.3)
Total Bilirubin: 0.4 mg/dL (ref 0.2–1.2)

## 2014-04-09 LAB — LIPID PANEL
CHOLESTEROL: 171 mg/dL (ref 0–200)
HDL: 43 mg/dL (ref >39)
LDL Cholesterol: 98 mg/dL (ref 0–99)
TRIGLYCERIDES: 152 mg/dL — AB (ref <150)
Total CHOL/HDL Ratio: 4 Ratio
VLDL: 30 mg/dL (ref 0–40)

## 2014-04-09 LAB — TSH: TSH: 3.626 u[IU]/mL (ref 0.350–4.500)

## 2014-04-09 LAB — VITAMIN D 25 HYDROXY (VIT D DEFICIENCY, FRACTURES): Vit D, 25-Hydroxy: 90 ng/mL — ABNORMAL HIGH (ref 30–89)

## 2014-04-09 LAB — HEMOGLOBIN A1C
HEMOGLOBIN A1C: 5.9 % — AB (ref <5.7)
MEAN PLASMA GLUCOSE: 123 mg/dL — AB (ref <117)

## 2014-04-09 LAB — INSULIN, FASTING: INSULIN FASTING, SERUM: 10.1 u[IU]/mL (ref 2.0–19.6)

## 2014-05-03 DIAGNOSIS — H33301 Unspecified retinal break, right eye: Secondary | ICD-10-CM | POA: Diagnosis not present

## 2014-06-23 ENCOUNTER — Ambulatory Visit
Admission: RE | Admit: 2014-06-23 | Discharge: 2014-06-23 | Disposition: A | Discharge status: Home / Self Care | Payer: Medicare Other | Source: Ambulatory Visit

## 2014-06-23 DIAGNOSIS — Z1231 Encounter for screening mammogram for malignant neoplasm of breast: Secondary | ICD-10-CM | POA: Diagnosis not present

## 2014-09-08 ENCOUNTER — Other Ambulatory Visit: Payer: Self-pay | Admitting: Emergency Medicine

## 2014-10-13 ENCOUNTER — Encounter: Payer: Self-pay | Admitting: Internal Medicine

## 2014-10-13 ENCOUNTER — Ambulatory Visit (INDEPENDENT_AMBULATORY_CARE_PROVIDER_SITE_OTHER): Payer: Medicare Other | Admitting: Internal Medicine

## 2014-10-13 VITALS — BP 134/68 | HR 72 | Temp 97.7°F | Resp 16 | Ht 62.0 in | Wt 154.0 lb

## 2014-10-13 DIAGNOSIS — E785 Hyperlipidemia, unspecified: Secondary | ICD-10-CM | POA: Diagnosis not present

## 2014-10-13 DIAGNOSIS — E559 Vitamin D deficiency, unspecified: Secondary | ICD-10-CM | POA: Diagnosis not present

## 2014-10-13 DIAGNOSIS — I10 Essential (primary) hypertension: Secondary | ICD-10-CM

## 2014-10-13 DIAGNOSIS — E039 Hypothyroidism, unspecified: Secondary | ICD-10-CM

## 2014-10-13 DIAGNOSIS — Z9181 History of falling: Secondary | ICD-10-CM

## 2014-10-13 DIAGNOSIS — R6889 Other general symptoms and signs: Secondary | ICD-10-CM

## 2014-10-13 DIAGNOSIS — Z79899 Other long term (current) drug therapy: Secondary | ICD-10-CM | POA: Diagnosis not present

## 2014-10-13 DIAGNOSIS — Z0001 Encounter for general adult medical examination with abnormal findings: Secondary | ICD-10-CM | POA: Diagnosis not present

## 2014-10-13 DIAGNOSIS — R7303 Prediabetes: Secondary | ICD-10-CM

## 2014-10-13 DIAGNOSIS — R7309 Other abnormal glucose: Secondary | ICD-10-CM

## 2014-10-13 DIAGNOSIS — Z1331 Encounter for screening for depression: Secondary | ICD-10-CM

## 2014-10-13 DIAGNOSIS — Z0000 Encounter for general adult medical examination without abnormal findings: Secondary | ICD-10-CM

## 2014-10-13 LAB — LIPID PANEL
CHOL/HDL RATIO: 4.5 ratio
Cholesterol: 175 mg/dL (ref 0–200)
HDL: 39 mg/dL — AB (ref >=46)
LDL Cholesterol: 109 mg/dL — ABNORMAL HIGH (ref 0–99)
TRIGLYCERIDES: 134 mg/dL (ref <150)
VLDL: 27 mg/dL (ref 0–40)

## 2014-10-13 LAB — HEMOGLOBIN A1C
Hgb A1c MFr Bld: 6.2 % — ABNORMAL HIGH (ref <5.7)
Mean Plasma Glucose: 131 mg/dL — ABNORMAL HIGH (ref <117)

## 2014-10-13 LAB — HEPATIC FUNCTION PANEL
ALT: 18 U/L (ref 0–35)
AST: 18 U/L (ref 0–37)
Albumin: 4.1 g/dL (ref 3.5–5.2)
Alkaline Phosphatase: 94 U/L (ref 39–117)
BILIRUBIN DIRECT: 0.1 mg/dL (ref 0.0–0.3)
BILIRUBIN TOTAL: 0.3 mg/dL (ref 0.2–1.2)
Indirect Bilirubin: 0.2 mg/dL (ref 0.2–1.2)
Total Protein: 7 g/dL (ref 6.0–8.3)

## 2014-10-13 LAB — CBC WITH DIFFERENTIAL/PLATELET
Basophils Absolute: 0.1 10*3/uL (ref 0.0–0.1)
Basophils Relative: 1 % (ref 0–1)
EOS ABS: 0.2 10*3/uL (ref 0.0–0.7)
Eosinophils Relative: 2 % (ref 0–5)
HCT: 42.8 % (ref 36.0–46.0)
Hemoglobin: 14.4 g/dL (ref 12.0–15.0)
LYMPHS PCT: 30 % (ref 12–46)
Lymphs Abs: 2.4 10*3/uL (ref 0.7–4.0)
MCH: 27.6 pg (ref 26.0–34.0)
MCHC: 33.6 g/dL (ref 30.0–36.0)
MCV: 82.1 fL (ref 78.0–100.0)
MPV: 9.1 fL (ref 8.6–12.4)
Monocytes Absolute: 0.9 10*3/uL (ref 0.1–1.0)
Monocytes Relative: 11 % (ref 3–12)
NEUTROS ABS: 4.5 10*3/uL (ref 1.7–7.7)
Neutrophils Relative %: 56 % (ref 43–77)
PLATELETS: 336 10*3/uL (ref 150–400)
RBC: 5.21 MIL/uL — AB (ref 3.87–5.11)
RDW: 14 % (ref 11.5–15.5)
WBC: 8.1 10*3/uL (ref 4.0–10.5)

## 2014-10-13 LAB — BASIC METABOLIC PANEL WITH GFR
BUN: 14 mg/dL (ref 6–23)
CHLORIDE: 106 meq/L (ref 96–112)
CO2: 28 mEq/L (ref 19–32)
Calcium: 9.8 mg/dL (ref 8.4–10.5)
Creat: 0.77 mg/dL (ref 0.50–1.10)
GFR, EST AFRICAN AMERICAN: 89 mL/min
GFR, Est Non African American: 77 mL/min
Glucose, Bld: 96 mg/dL (ref 70–99)
Potassium: 4 mEq/L (ref 3.5–5.3)
SODIUM: 141 meq/L (ref 135–145)

## 2014-10-13 LAB — TSH: TSH: 4.274 u[IU]/mL (ref 0.350–4.500)

## 2014-10-13 LAB — MAGNESIUM: MAGNESIUM: 1.8 mg/dL (ref 1.5–2.5)

## 2014-10-13 NOTE — Progress Notes (Signed)
Patient ID: Anna Caldwell, female   DOB: 09-May-1941, 74 y.o.   MRN: [id number]  MEDICARE ANNUAL WELLNESS VISIT AND OV  Assessment:   1. Essential hypertension  - BASIC METABOLIC PANEL WITH GFR  2. Hyperlipidemia  - Lipid panel  3. Prediabetes  - Hemoglobin A1c - Insulin, random  4. Vitamin D deficiency  - Vit D  25 hydroxy  5. Hypothyroidism  - TSH  6. Depression screen  - Screen Negative   7. At low risk for fall   8. Medication management  - CBC with Differential/Platelet - Hepatic function panel - Magnesium  9. Routine general medical examination at a health care facility  Plan:   During the course of the visit the patient was educated and counseled about appropriate screening and preventive services including:    Pneumococcal vaccine   Influenza vaccine  Td vaccine  Screening electrocardiogram  Bone densitometry screening  Colorectal cancer screening  Diabetes screening  Glaucoma screening  Nutrition counseling   Advanced directives: requested  Screening recommendations, referrals: Vaccinations:  Immunization History  Administered Date(s) Administered  . DT 04/08/2014  . Pneumococcal Polysaccharide-23 11/18/2006, 04/08/2014  . Td 07/16/2002  . Zoster 01/29/2007  Influenza vaccine Oct 2015 - missed 2015 Prevnar vaccine ordered Hep B vaccine not indicated  Nutrition assessed and recommended  Colonoscopy 01/13/2007 Recommended yearly ophthalmology/optometry visit for glaucoma screening and checkup Recommended yearly dental visit for hygiene and checkup Advanced directives - yes  Conditions/risks identified: BMI: Discussed weight loss, diet, and increase physical activity.  Increase physical activity: AHA recommends 150 minutes of physical activity a week.  Medications reviewed PreDiabetes is not at goal, ACE/ARB therapy: not indicated Urinary Incontinence is not an issue: discussed non pharmacology and pharmacology  options.  Fall risk: low- discussed PT, home fall assessment, medications.   Subjective:    Anna Caldwell  presents for [employer] Visit and OV.  Date of last medicare wellness visit was 04/08/2014. She has had labile elevated blood pressure for years and is monitored expectantly.  This very nice 74 y.o.female presents for 3 month follow up with Hypertension, Hyperlipidemia, Pre-Diabetes and Vitamin D Deficiency. BP has been controlled at home. Today's BP: 134/68 mmHg. Patient has had no complaints of any cardiac type chest pain, palpitations, dyspnea/orthopnea/PND, dizziness, claudication, or dependent edema.   Hyperlipidemia is near controlled with diet. Last Lipids were near goal - Total Chol 175; HDL 39*; with elevated LDL 109; Trig 134 on 10/13/2014.   Also, the patient has history of PreDiabetes since Sept 2011 with A1c 5.9% and has had no symptoms of reactive hypoglycemia, diabetic polys, paresthesias or visual blurring.  Last A1c was 5.9% on   04/08/2014.   Patient has been on thyroid replacement since 2013. Also, she has hx/o GERD which is controlled with diet. Further, the patient also has history of Vitamin D Deficiency of 13 in 2008 and supplements vitamin D without any suspected side-effects. Last vitamin D was  90 on  04/08/2014.  Names of Other Physician/Practitioners you currently use: 1. [facility] here for primary care 2. Dr Satira Sark, eye doctor, last visit Sept 2015 3. Dr Gordy Levan, [place], dentist, has visit every 6 months  Patient Care Team: Unk Pinto, MD as PCP - General (Internal Medicine) Inda Castle, MD as Consulting Physician (Gastroenterology) Marygrace Drought, MD as Consulting Physician (Ophthalmology) Rolm Bookbinder, MD as Consulting Physician (Dermatology)  Medication Review: Medication Sig  . aspirin 81 MG  tab Sarina Ser  by mouth daily.  Marland Kitchen VITAMIN D  Take 10,000 Int'l Units by mouth daily.  . fluocinonide cream  (LIDEX) 0.05 %   . KRILL OIL PO Take by mouth daily. Two tablets daily.  Marland Kitchen levothyroxine  50 MCG tablet Takes 1/2 daily  . [facility] brand allergy pill daily.    Current Problems (verified) Patient Active Problem List   Diagnosis Date Noted  . Medication management 06/22/2013  . Hypertension   . Hyperlipidemia   . Vitamin D deficiency   . Hypothyroidism   . Prediabetes    Screening Tests Health Maintenance  Topic Date Due  . TETANUS/TDAP  07/16/2012  . INFLUENZA VACCINE  02/13/2014  . PNA vac Low Risk Adult (2 of 2 - PCV13) 04/09/2015  . MAMMOGRAM  06/23/2016  . COLONOSCOPY  01/12/2017  . DEXA SCAN  Completed  . ZOSTAVAX  Completed    Immunization History  Administered Date(s) Administered  . DT 04/08/2014  . Pneumococcal Polysaccharide-23 11/18/2006, 04/08/2014  . Td 07/16/2002  . Zoster 01/29/2007   Preventative care: Last colonoscopy: 01/13/2007  History reviewed: allergies, current medications, past family history, past medical history, past social history, past surgical history and problem list  Risk Factors: Tobacco History  Substance Use Topics  . Smoking status: Never Smoker   . Smokeless tobacco: Never Used  . Alcohol Use: Yes     Comment: rarely   She does not smoke.  Patient is not a former smoker. Are there smokers in your home (other than you)?  No Alcohol Current alcohol use: rarely  Caffeine Current caffeine use: coffee 1-2 cups /day  Exercise Current exercise: gardening and yard work  Nutrition/Diet Current diet: in general, an "unhealthy" diet  Cardiac risk factors: advanced age (older than 65 for men, 68 for women), diabetes mellitus, dyslipidemia, hypertension, obesity (BMI >= 30 kg/m2) and sedentary lifestyle.  Depression Screen (Note: if answer to either of the following is "Yes", a more complete depression screening is indicated)   Q1: Over the past two weeks, have you felt down, depressed or hopeless? No  Q2: Over the past two weeks,  have you felt little interest or pleasure in doing things? No  Have you lost interest or pleasure in daily life? No  Do you often feel hopeless? No  Do you cry easily over simple problems? No  Activities of Daily Living In your present state of health, do you have any difficulty performing the following activities?:  Driving? No Managing money?  No Feeding yourself? No Getting from bed to chair? No Climbing a flight of stairs? No Preparing food and eating?: No Bathing or showering? No Getting dressed: No Getting to the toilet? No Using the toilet:No Moving around from place to place: No In the past year have you fallen or had a near fall?:No   Are you sexually active?  No  Do you have more than one partner?  No  Vision Difficulties: No  Hearing Difficulties: No Do you often ask people to speak up or repeat themselves? No Do you experience ringing or noises in your ears? No Do you have difficulty understanding soft or whispered voices? Sometimes.  Cognition  Do you feel that you have a problem with memory?No  Do you often misplace items? No  Do you feel safe at home?  Yes  Advanced directives Does patient have a [place]? Yes Does patient have a Living Will? Yes  Past Medical History  Diagnosis Date  . Hypertension   .  Hyperlipidemia   . Vitamin D deficiency   . Thyroid disease   . Arthritis   . Prediabetes    Past Surgical History  Procedure Laterality Date  . Appendectomy    . Tonsillectomy    . Other surgical history  1981    BTL    ROS: Constitutional: Denies fever, chills, weight loss/gain, headaches, insomnia, fatigue, night sweats, and change in appetite. Eyes: Denies redness, blurred vision, diplopia, discharge, itchy, watery eyes.  ENT: Denies discharge, congestion, post nasal drip, epistaxis, sore throat, earache, hearing loss, dental pain, Tinnitus, Vertigo, Sinus pain, snoring.  Cardio: Denies chest pain, palpitations,  irregular heartbeat, syncope, dyspnea, diaphoresis, orthopnea, PND, claudication, edema Respiratory: denies cough, dyspnea, DOE, pleurisy, hoarseness, laryngitis, wheezing.  Gastrointestinal: Denies dysphagia, heartburn, reflux, water brash, pain, cramps, nausea, vomiting, bloating, diarrhea, constipation, hematemesis, melena, hematochezia, jaundice, hemorrhoids Genitourinary: Denies dysuria, frequency, urgency, nocturia, hesitancy, discharge, hematuria, flank pain Musculoskeletal: Denies arthralgia, myalgia, stiffness, Jt. Swelling, pain, limp, and strain/sprain. Denies falls. Skin: Denies puritis, rash, hives, warts, acne, eczema, changing in skin lesion Neuro: No weakness, tremor, incoordination, spasms, paresthesia, pain Psychiatric: Denies confusion, memory loss, sensory loss. Denies Depression. Endocrine: Denies change in weight, skin, hair change, nocturia, and paresthesia, diabetic polys, visual blurring, hyper / hypo glycemic episodes.  Heme/Lymph: No excessive bleeding, bruising, enlarged lymph nodes  Objective:     BP 134/68   Pulse 72  Temp 97.7 F   Resp 16  Ht 5\' 2"    Wt 154 lb    BMI 28.16   General Appearance: Well nourished, alert, WD/WN, female and in no apparent distress. Eyes: PERRLA, EOMs, conjunctiva no swelling or erythema, normal fundi and vessels. Sinuses: No frontal/maxillary tenderness ENT/Mouth: EACs patent / TMs  nl. Nares clear without erythema, swelling, mucoid exudates. Oral hygiene is good. No erythema, swelling, or exudate. Tongue normal, non-obstructing. Tonsils not swollen or erythematous. Hearing normal.  Neck: Supple, thyroid normal. No bruits, nodes or JVD. Respiratory: Respiratory effort normal.  BS equal and clear bilateral without rales, rhonci, wheezing or stridor. Cardio: Heart sounds are normal with regular rate and rhythm and no murmurs, rubs or gallops. Peripheral pulses are normal and equal bilaterally without edema. No aortic or femoral  bruits. Chest: symmetric with normal excursions and percussion. Abdomen: Flat, soft  with nl bowel sounds. Nontender, no guarding, rebound, hernias, masses, or organomegaly.  Lymphatics: Non tender without lymphadenopathy.  Genitourinary:  Musculoskeletal: Full ROM all peripheral extremities, joint stability, 5/5 strength, and normal gait. Skin: Warm and dry without rashes, lesions, cyanosis, clubbing or  ecchymosis.  Neuro: Cranial nerves intact, reflexes equal bilaterally. Normal muscle tone, no cerebellar symptoms. Sensation intact.  Pysch: Awake and oriented X 3, normal affect, Insight and Judgment appropriate.   Cognitive Testing  Alert? Yes  Normal Appearance?Yes  Oriented to person? Yes  Place? Yes   Time? Yes  Recall of three objects?  Yes  Can perform simple calculations? Yes  Displays appropriate judgment? Yes  Can read the correct time from a watch/clock?Yes  Medicare Attestation I have personally reviewed: The patient's medical and social history Their use of alcohol, tobacco or illicit drugs Their current medications and supplements The patient's functional ability including ADLs,fall risks, home safety risks, cognitive, and hearing and visual impairment Diet and physical activities Evidence for depression or mood disorders  The patient's weight, height, BMI, and visual acuity have been recorded in the chart.  I have made referrals, counseling, and provided education to the patient based on review of  the above and I have provided the patient with a written personalized care plan for preventive services.  Over 40 minutes of exam, counseling, chart review was performed.  Lagina Reader DAVID, MD   10/13/2014

## 2014-10-13 NOTE — Patient Instructions (Signed)

## 2014-10-14 LAB — INSULIN, RANDOM: Insulin: 53 u[IU]/mL — ABNORMAL HIGH (ref 2.0–19.6)

## 2014-10-14 LAB — VITAMIN D 25 HYDROXY (VIT D DEFICIENCY, FRACTURES): VIT D 25 HYDROXY: 64 ng/mL (ref 30–100)

## 2014-12-29 DIAGNOSIS — D225 Melanocytic nevi of trunk: Secondary | ICD-10-CM | POA: Diagnosis not present

## 2014-12-29 DIAGNOSIS — L821 Other seborrheic keratosis: Secondary | ICD-10-CM | POA: Diagnosis not present

## 2014-12-29 DIAGNOSIS — L918 Other hypertrophic disorders of the skin: Secondary | ICD-10-CM | POA: Diagnosis not present

## 2014-12-29 DIAGNOSIS — L820 Inflamed seborrheic keratosis: Secondary | ICD-10-CM | POA: Diagnosis not present

## 2014-12-29 DIAGNOSIS — D2262 Melanocytic nevi of left upper limb, including shoulder: Secondary | ICD-10-CM | POA: Diagnosis not present

## 2014-12-29 DIAGNOSIS — D2271 Melanocytic nevi of right lower limb, including hip: Secondary | ICD-10-CM | POA: Diagnosis not present

## 2014-12-29 DIAGNOSIS — Z85828 Personal history of other malignant neoplasm of skin: Secondary | ICD-10-CM | POA: Diagnosis not present

## 2014-12-29 DIAGNOSIS — D2272 Melanocytic nevi of left lower limb, including hip: Secondary | ICD-10-CM | POA: Diagnosis not present

## 2014-12-29 DIAGNOSIS — D1801 Hemangioma of skin and subcutaneous tissue: Secondary | ICD-10-CM | POA: Diagnosis not present

## 2014-12-29 DIAGNOSIS — L570 Actinic keratosis: Secondary | ICD-10-CM | POA: Diagnosis not present

## 2014-12-29 DIAGNOSIS — L814 Other melanin hyperpigmentation: Secondary | ICD-10-CM | POA: Diagnosis not present

## 2015-04-18 ENCOUNTER — Ambulatory Visit (INDEPENDENT_AMBULATORY_CARE_PROVIDER_SITE_OTHER): Payer: Medicare Other | Admitting: Internal Medicine

## 2015-04-18 ENCOUNTER — Encounter: Payer: Self-pay | Admitting: Internal Medicine

## 2015-04-18 VITALS — BP 142/84 | HR 76 | Temp 97.0°F | Resp 16 | Ht 60.75 in | Wt 152.6 lb

## 2015-04-18 DIAGNOSIS — E559 Vitamin D deficiency, unspecified: Secondary | ICD-10-CM | POA: Diagnosis not present

## 2015-04-18 DIAGNOSIS — I10 Essential (primary) hypertension: Secondary | ICD-10-CM

## 2015-04-18 DIAGNOSIS — Z789 Other specified health status: Secondary | ICD-10-CM

## 2015-04-18 DIAGNOSIS — Z1389 Encounter for screening for other disorder: Secondary | ICD-10-CM

## 2015-04-18 DIAGNOSIS — Z79899 Other long term (current) drug therapy: Secondary | ICD-10-CM | POA: Diagnosis not present

## 2015-04-18 DIAGNOSIS — Z0001 Encounter for general adult medical examination with abnormal findings: Secondary | ICD-10-CM

## 2015-04-18 DIAGNOSIS — R6889 Other general symptoms and signs: Secondary | ICD-10-CM | POA: Diagnosis not present

## 2015-04-18 DIAGNOSIS — Z6829 Body mass index (BMI) 29.0-29.9, adult: Secondary | ICD-10-CM

## 2015-04-18 DIAGNOSIS — Z1212 Encounter for screening for malignant neoplasm of rectum: Secondary | ICD-10-CM

## 2015-04-18 DIAGNOSIS — R7303 Prediabetes: Secondary | ICD-10-CM | POA: Diagnosis not present

## 2015-04-18 DIAGNOSIS — E785 Hyperlipidemia, unspecified: Secondary | ICD-10-CM

## 2015-04-18 DIAGNOSIS — Z1331 Encounter for screening for depression: Secondary | ICD-10-CM

## 2015-04-18 DIAGNOSIS — Z23 Encounter for immunization: Secondary | ICD-10-CM | POA: Diagnosis not present

## 2015-04-18 DIAGNOSIS — E039 Hypothyroidism, unspecified: Secondary | ICD-10-CM | POA: Diagnosis not present

## 2015-04-18 DIAGNOSIS — E663 Overweight: Secondary | ICD-10-CM | POA: Insufficient documentation

## 2015-04-18 DIAGNOSIS — R7309 Other abnormal glucose: Secondary | ICD-10-CM

## 2015-04-18 DIAGNOSIS — Z9181 History of falling: Secondary | ICD-10-CM

## 2015-04-18 LAB — BASIC METABOLIC PANEL WITH GFR
BUN: 13 mg/dL (ref 7–25)
CALCIUM: 9.8 mg/dL (ref 8.6–10.4)
CO2: 25 mmol/L (ref 20–31)
Chloride: 105 mmol/L (ref 98–110)
Creat: 0.68 mg/dL (ref 0.60–0.93)
GFR, Est African American: >89 mL/min (ref >=60)
GFR, Est Non African American: 87 mL/min (ref >=60)
GLUCOSE: 102 mg/dL — AB (ref 65–99)
Potassium: 4.6 mmol/L (ref 3.5–5.3)
Sodium: 142 mmol/L (ref 135–146)

## 2015-04-18 LAB — LIPID PANEL
CHOLESTEROL: 171 mg/dL (ref 125–200)
HDL: 43 mg/dL — ABNORMAL LOW (ref >=46)
LDL CALC: 109 mg/dL (ref <130)
TRIGLYCERIDES: 93 mg/dL (ref <150)
Total CHOL/HDL Ratio: 4 Ratio (ref <=5.0)
VLDL: 19 mg/dL (ref <30)

## 2015-04-18 LAB — CBC WITH DIFFERENTIAL/PLATELET
Basophils Absolute: 0.1 10*3/uL (ref 0.0–0.1)
Basophils Relative: 1 % (ref 0–1)
EOS PCT: 3 % (ref 0–5)
Eosinophils Absolute: 0.3 10*3/uL (ref 0.0–0.7)
HEMATOCRIT: 39.6 % (ref 36.0–46.0)
Hemoglobin: 13.6 g/dL (ref 12.0–15.0)
LYMPHS ABS: 2.4 10*3/uL (ref 0.7–4.0)
LYMPHS PCT: 28 % (ref 12–46)
MCH: 27.9 pg (ref 26.0–34.0)
MCHC: 34.3 g/dL (ref 30.0–36.0)
MCV: 81.1 fL (ref 78.0–100.0)
MONO ABS: 0.6 10*3/uL (ref 0.1–1.0)
MONOS PCT: 7 % (ref 3–12)
MPV: 9.2 fL (ref 8.6–12.4)
Neutro Abs: 5.1 10*3/uL (ref 1.7–7.7)
Neutrophils Relative %: 61 % (ref 43–77)
Platelets: 303 10*3/uL (ref 150–400)
RBC: 4.88 MIL/uL (ref 3.87–5.11)
RDW: 14.1 % (ref 11.5–15.5)
WBC: 8.4 10*3/uL (ref 4.0–10.5)

## 2015-04-18 LAB — HEPATIC FUNCTION PANEL
ALBUMIN: 4.4 g/dL (ref 3.6–5.1)
ALK PHOS: 75 U/L (ref 33–130)
ALT: 17 U/L (ref 6–29)
AST: 17 U/L (ref 10–35)
Bilirubin, Direct: 0.1 mg/dL (ref <=0.2)
Indirect Bilirubin: 0.4 mg/dL (ref 0.2–1.2)
TOTAL PROTEIN: 7.1 g/dL (ref 6.1–8.1)
Total Bilirubin: 0.5 mg/dL (ref 0.2–1.2)

## 2015-04-18 LAB — HEMOGLOBIN A1C
HEMOGLOBIN A1C: 6 % — AB (ref <5.7)
MEAN PLASMA GLUCOSE: 126 mg/dL — AB (ref <117)

## 2015-04-18 LAB — TSH: TSH: 3.333 u[IU]/mL (ref 0.350–4.500)

## 2015-04-18 LAB — MAGNESIUM: Magnesium: 2.1 mg/dL (ref 1.5–2.5)

## 2015-04-18 NOTE — Patient Instructions (Signed)
Recommend Adult Low dose Aspirin or coated  Aspirin 81 mg daily   To reduce risk of Colon Cancer 20 %,   Skin Cancer 26 % ,   Melanoma 46%   and   Pancreatic cancer 60% ++++++++++++++++++ Vitamin D goal is between 70-100.   Please make sure that you are taking your Vitamin D as directed.   It is very important as a natural anti-inflammatory   helping hair, skin, and nails, as well as reducing stroke and heart attack risk.   It helps your bones and helps with mood.  It also decreases numerous cancer risks so please take it as directed.   Low Vit D is associated with a 200-300% higher risk for CANCER   and 200-300% higher risk for HEART   ATTACK  &  STROKE.   ......................................  It is also associated with higher death rate at younger ages,   autoimmune diseases like Rheumatoid arthritis, Lupus, Multiple Sclerosis.     Also many other serious conditions, like depression, Alzheimer's  Dementia, infertility, muscle aches, fatigue, fibromyalgia - just to name a few.  +++++++++++++++++++  Recommend the book "The END of DIETING" by Dr Joel Fuhrman   & the book "The END of DIABETES " by Dr Joel Fuhrman  At [employer] - get book & Audio CD's     Being diabetic has a  300% increased risk for heart attack, stroke, cancer, and alzheimer- type vascular dementia. It is very important that you work harder with diet by avoiding all foods that are white. Avoid white rice (brown & wild rice is OK), white potatoes (sweetpotatoes in moderation is OK), White bread or wheat bread or anything made out of white flour like bagels, donuts, rolls, buns, biscuits, cakes, pastries, cookies, pizza crust, and pasta (made from white flour & egg whites) - vegetarian pasta or spinach or wheat pasta is OK. Multigrain breads like Arnold's or Pepper[employer], or multigrain sandwich thins or flatbreads.  Diet, exercise and weight loss can reverse and cure diabetes in the early stages.   Diet, exercise and weight loss is very important in the control and prevention of complications of diabetes which affects every system in your body, ie. Brain - dementia/stroke, eyes - glaucoma/blindness, heart - heart attack/heart failure, kidneys - dialysis, stomach - gastric paralysis, intestines - malabsorption, nerves - severe painful neuritis, circulation - gangrene & loss of a leg(s), and finally cancer and Alzheimers.    I recommend avoid fried & greasy foods,  sweets/candy, white rice (brown or wild rice or Quinoa is OK), white potatoes (sweet potatoes are OK) - anything made from white flour - bagels, doughnuts, rolls, buns, biscuits,white and wheat breads, pizza crust and traditional pasta made of white flour & egg white(vegetarian pasta or spinach or wheat pasta is OK).  Multi-grain bread is OK - like multi-grain flat bread or sandwich thins. Avoid alcohol in excess. Exercise is also important.    Eat all the vegetables you want - avoid meat, especially red meat and dairy - especially cheese.  Cheese is the most concentrated form of trans-fats which is the worst thing to clog up our arteries. Veggie cheese is OK which can be found in the fresh produce section at [employer] or [employer] or [employer]  ++++++++++++++++++++++++++  Preventative Care for Adults - Female      MAINTAIN REGULAR HEALTH EXAMS:  A routine yearly physical is a good way to check in with your primary care provider about your health and   preventive screening. It is also an opportunity to share updates about your health and any concerns you have, and receive a thorough all-over exam.   Most health insurance companies pay for at least some preventative services.  Check with your health plan for specific coverages.  WHAT PREVENTATIVE SERVICES DO WOMEN NEED?  Adult women should have their weight and blood pressure checked regularly.   Women age 35 and older should have their cholesterol levels checked  regularly.  Women should be screened for cervical cancer with a Pap smear and pelvic exam beginning at either age 21, or 3 years after they become sexually activity.    Breast cancer screening generally begins at age 40 with a mammogram and breast exam by your primary care provider.    Beginning at age 50 and continuing to age 75, women should be screened for colorectal cancer.  Certain people may need continued testing until age 85.  Updating vaccinations is part of preventative care.  Vaccinations help protect against diseases such as the flu.  Osteoporosis is a disease in which the bones lose minerals and strength as we age. Women ages 65 and over should discuss this with their caregivers, as should women after menopause who have other risk factors.  Lab tests are generally done as part of preventative care to screen for anemia and blood disorders, to screen for problems with the kidneys and liver, to screen for bladder problems, to check blood sugar, and to check your cholesterol level.  Preventative services generally include counseling about diet, exercise, avoiding tobacco, drugs, excessive alcohol consumption, and sexually transmitted infections.    GENERAL RECOMMENDATIONS FOR GOOD HEALTH:  Healthy diet:  Eat a variety of foods, including fruit, vegetables, animal or vegetable protein, such as meat, fish, chicken, and eggs, or beans, lentils, tofu, and grains, such as rice.  Drink plenty of water daily.  Decrease saturated fat in the diet, avoid lots of red meat, processed foods, sweets, fast foods, and fried foods.  Exercise:  Aerobic exercise helps maintain good heart health. At least 30-40 minutes of moderate-intensity exercise is recommended. For example, a brisk walk that increases your heart rate and breathing. This should be done on most days of the week.   Find a type of exercise or a variety of exercises that you enjoy so that it becomes a part of your daily life.   Examples are running, walking, swimming, water aerobics, and biking.  For motivation and support, explore group exercise such as aerobic class, spin class, Zumba, Yoga,or  martial arts, etc.    Set exercise goals for yourself, such as a certain weight goal, walk or run in a race such as a 5k walk/run.  Speak to your primary care provider about exercise goals.  Disease prevention:  If you smoke or chew tobacco, find out from your caregiver how to quit. It can literally save your life, no matter how long you have been a tobacco user. If you do not use tobacco, never begin.   Maintain a healthy diet and normal weight. Increased weight leads to problems with blood pressure and diabetes.   The Body Mass Index or BMI is a way of measuring how much of your body is fat. Having a BMI above 27 increases the risk of heart disease, diabetes, hypertension, stroke and other problems related to obesity. Your caregiver can help determine your BMI and based on it develop an exercise and dietary program to help you achieve or maintain this important measurement   at a healthful level.  High blood pressure causes heart and blood vessel problems.  Persistent high blood pressure should be treated with medicine if weight loss and exercise do not work.   Fat and cholesterol leaves deposits in your arteries that can block them. This causes heart disease and vessel disease elsewhere in your body.  If your cholesterol is found to be high, or if you have heart disease or certain other medical conditions, then you may need to have your cholesterol monitored frequently and be treated with medication.   Ask if you should have a cardiac stress test if your history suggests this. A stress test is a test done on a treadmill that looks for heart disease. This test can find disease prior to there being a problem.  Menopause can be associated with physical symptoms and risks. Hormone replacement therapy is available to decrease these.  You should talk to your caregiver about whether starting or continuing to take hormones is right for you.   Osteoporosis is a disease in which the bones lose minerals and strength as we age. This can result in serious bone fractures. Risk of osteoporosis can be identified using a bone density scan. Women ages 65 and over should discuss this with their caregivers, as should women after menopause who have other risk factors. Ask your caregiver whether you should be taking a calcium supplement and Vitamin D, to reduce the rate of osteoporosis.   Avoid drinking alcohol in excess (more than two drinks per day).  Avoid use of street drugs. Do not share needles with anyone. Ask for professional help if you need assistance or instructions on stopping the use of alcohol, cigarettes, and/or drugs.  Brush your teeth twice a day with fluoride toothpaste, and floss once a day. Good oral hygiene prevents tooth decay and gum disease. The problems can be painful, unattractive, and can cause other health problems. Visit your dentist for a routine oral and dental check up and preventive care every 6-12 months.   Look at your skin regularly.  Use a mirror to look at your back. Notify your caregivers of changes in moles, especially if there are changes in shapes, colors, a size larger than a pencil eraser, an irregular border, or development of new moles.  Safety:  Use seatbelts 100% of the time, whether driving or as a passenger.  Use safety devices such as hearing protection if you work in environments with loud noise or significant background noise.  Use safety glasses when doing any work that could send debris in to the eyes.  Use a helmet if you ride a bike or motorcycle.  Use appropriate safety gear for contact sports.  Talk to your caregiver about gun safety.  Use sunscreen with a SPF (or skin protection factor) of 15 or greater.  Lighter skinned people are at a greater risk of skin cancer. Don't forget to also  wear sunglasses in order to protect your eyes from too much damaging sunlight. Damaging sunlight can accelerate cataract formation.   Practice safe sex. Use condoms. Condoms are used for birth control and to help reduce the spread of sexually transmitted infections (or STIs).  Some of the STIs are gonorrhea (the clap), chlamydia, syphilis, trichomonas, herpes, HPV (human papilloma virus) and HIV (human immunodeficiency virus) which causes AIDS. The herpes, HIV and HPV are viral illnesses that have no cure. These can result in disability, cancer and death.   Keep carbon monoxide and smoke detectors in your home functioning   at all times. Change the batteries every 6 months or use a model that plugs into the wall.   Vaccinations:  Stay up to date with your tetanus shots and other required immunizations. You should have a booster for tetanus every 10 years. Be sure to get your flu shot every year, since 5%-20% of the [place] population comes down with the flu. The flu vaccine changes each year, so being vaccinated once is not enough. Get your shot in the fall, before the flu season peaks.   Other vaccines to consider:  Human Papilloma Virus or HPV causes cancer of the cervix, and other infections that can be transmitted from person to person. There is a vaccine for HPV, and females should get immunized between the ages of 11 and 26. It requires a series of 3 shots.   Pneumococcal vaccine to protect against certain types of pneumonia.  This is normally recommended for adults age 65 or older.  However, adults younger than 73 years old with certain underlying conditions such as diabetes, heart or lung disease should also receive the vaccine.  Shingles vaccine to protect against Varicella Zoster if you are older than age 60, or younger than 73 years old with certain underlying illness.  Hepatitis A vaccine to protect against a form of infection of the liver by a virus acquired from food.  Hepatitis B vaccine  to protect against a form of infection of the liver by a virus acquired from blood or body fluids, particularly if you work in health care.  If you plan to travel internationally, check with your local health department for specific vaccination recommendations.  Cancer Screening:  Breast cancer screening is essential to preventive care for women. All women age 20 and older should perform a breast self-exam every month. At age 40 and older, women should have their caregiver complete a breast exam each year. Women at ages 40 and older should have a mammogram (x-ray film) of the breasts. Your caregiver can discuss how often you need mammograms.    Cervical cancer screening includes taking a Pap smear (sample of cells examined under a microscope) from the cervix (end of the uterus). It also includes testing for HPV (Human Papilloma Virus, which can cause cervical cancer). Screening and a pelvic exam should begin at age 21, or 3 years after a woman becomes sexually active. Screening should occur every year, with a Pap smear but no HPV testing, up to age 30. After age 30, you should have a Pap smear every 3 years with HPV testing, if no HPV was found previously.   Most routine colon cancer screening begins at the age of 50. On a yearly basis, doctors may provide special easy to use take-home tests to check for hidden blood in the stool. Sigmoidoscopy or colonoscopy can detect the earliest forms of colon cancer and is life saving. These tests use a small camera at the end of a tube to directly examine the colon. Speak to your caregiver about this at age 50, when routine screening begins (and is repeated every 5 years unless early forms of pre-cancerous polyps or small growths are found).     

## 2015-04-18 NOTE — Progress Notes (Signed)
Patient ID: Anna Caldwell, female   DOB: Nov 28, 1940, 74 y.o.   MRN: [id number]  MEDICARE ANNUAL WELLNESS VISIT And Comprehensive Evaluation, Examination and Management of multiple medical co-morbidities   Assessment:   1. Essential hypertension   - Microalbumin / creatinine urine ratio - EKG 12-Lead  2. Hyperlipidemia  - Lipid panel  3. Prediabetes  - Hemoglobin A1c - Insulin, random  4. Vitamin D deficiency  - Vit D  25 hydroxy   5. Other abnormal glucose  - Hemoglobin A1c  6. Hypothyroidism   7. Screening for rectal cancer  - POC Hemoccult Bld/Stl   8. Encounter for general adult medical examination with abnormal findings  - Microalbumin / creatinine urine ratio - EKG 12-Lead - POC Hemoccult Bld/Stl (3-Cd Home Screen); Future - Urinalysis, Routine w reflex microscopic - CBC with Differential/Platelet - BASIC METABOLIC PANEL WITH GFR - Hepatic function panel - Magnesium - Lipid panel - TSH - Hemoglobin A1c - Insulin, random - Vit D  25 hydroxy   9. BMI 29.0-29.9,adult   10. At low risk for fall   11. Depression screen   12. Need for prophylactic vaccination and inoculation against influenza  - Flu vaccine HIGH DOSE PF (Fluzone High dose)  13. Medication management  - Urinalysis, Routine w reflex microscopic  - CBC with Differential/Platelet - BASIC METABOLIC PANEL WITH GFR - Hepatic function panel - Magnesium  Plan:   During the course of the visit the patient was educated and counseled about appropriate screening and preventive services including:    Pneumococcal vaccine   Influenza vaccine  Td vaccine  Screening electrocardiogram  Bone densitometry screening  Colorectal cancer screening  Diabetes screening  Glaucoma screening  Nutrition counseling   Advanced directives: requested  Screening recommendations, referrals: Vaccinations:  Immunization History  Administered Date(s) Administered  . DT 04/08/2014  .  Influenza, High Dose Seasonal PF 04/18/2015  . Pneumococcal Polysaccharide-23 11/18/2006, 04/08/2014  . Td 07/16/2002  . Zoster 01/29/2007  Prevnar vaccine Out of Stock and deferred  Hep B vaccine not indicated  Nutrition assessed and recommended  Colonoscopy 01/13/2007 Recommended yearly ophthalmology/optometry visit for glaucoma screening and checkup Recommended yearly dental visit for hygiene and checkup Advanced directives - yes  Conditions/risks identified: BMI: Discussed weight loss, diet, and increase physical activity.  Increase physical activity: AHA recommends 150 minutes of physical activity a week.  Medications reviewed PreDiabetes is at goal, ACE/ARB therapy: Not Indicated Urinary Incontinence is not an issue: discussed non pharmacology and pharmacology options.  Fall risk: low- discussed PT, home fall assessment, medications.   Subjective:    Anna Caldwell  presents for [employer] Visit and presents for a comprehensive evaluation, examination and management of multiple medical co-morbidities.  Date of last medicare wellness visit was 04/08/2014.  She has had elevated blood pressure for many years and treatment was started in Feb 2012. Her blood pressure has been controlled at home, & today her BP is BP: (!) 142/84 mmHg She does not workout. She denies chest pain, shortness of breath, dizziness.  She is not on cholesterol medication and denies myalgias. Her cholesterol is not at goal. The cholesterol last visit was:  Lab Results  Component Value Date   CHOL 175 10/13/2014   HDL 39* 10/13/2014   LDLCALC 109* 10/13/2014   TRIG 134 10/13/2014   CHOLHDL 4.5 10/13/2014    She has had Prediabetes for 5 years (2011). She has been working on diet and exercise for  prediabetes, and  denies foot ulcerations, hyperglycemia, hypoglycemia , paresthesia of the feet, polydipsia, polyuria, visual disturbances and weight loss. Last A1c was Hgb A1c MFr Bld 6.2% on  10/13/2014.  Patient is on Vitamin D supplement.   Last vitamin D was 64 on 10/13/2014.    Names of Other Physician/Practitioners you currently use: 1.  [facility] here for primary care 2. Dr Satira Sark, eye doctor, last visit Sept, 2015 3. Dr Gordy Levan, [place], dentist, last visit Apr 2016 & every 6 months  Patient Care Team: Unk Pinto, MD as PCP - General (Internal Medicine) Inda Castle, MD as Consulting Physician (Gastroenterology) Marygrace Drought, MD as Consulting Physician (Ophthalmology) Rolm Bookbinder, MD as Consulting Physician (Dermatology)  Medication Review: Medication Sig  . aspirin 81 MG chewable tablet Chew by mouth daily.  . Cholecalciferol (VITAMIN D PO) Take 10,000 Int'l Units by mouth daily.  . fluocinonide cream (LIDEX) 0.05 %   . KRILL OIL PO Take by mouth daily. Two tablets daily.  Marland Kitchen levothyroxine (SYNTHROID, LEVOTHROID) 50 MCG tablet Takes 1/2 daily  . OVER THE COUNTER MEDICATION daily. [facility] brand allergy pill   No Known Allergies  Current Problems (verified) Patient Active Problem List   Diagnosis Date Noted  . BMI 29.0-29.9,adult 04/18/2015  . Medication management 06/22/2013  . Hypertension   . Hyperlipidemia   . Vitamin D deficiency   . Hypothyroidism   . Prediabetes    Screening Tests Health Maintenance  Topic Date Due  . TETANUS/TDAP  07/16/2012  . INFLUENZA VACCINE  02/14/2015  . PNA vac Low Risk Adult (2 of 2 - PCV13) 04/09/2015  . MAMMOGRAM  06/23/2016  . COLONOSCOPY  01/12/2017  . DEXA SCAN  Completed  . ZOSTAVAX  Completed   Immunization History  Administered Date(s) Administered  . DT 04/08/2014  . Influenza, High Dose Seasonal PF 04/18/2015  . Pneumococcal Polysaccharide-23 11/18/2006, 04/08/2014  . Td 07/16/2002  . Zoster 01/29/2007   Preventative care: Last colonoscopy: 01/13/2007  Past Medical History  Diagnosis Date  . Hypertension   . Hyperlipidemia   . Vitamin D deficiency   . Thyroid  disease   . Arthritis   . Prediabetes    Past Surgical History  Procedure Laterality Date  . Appendectomy    . Tonsillectomy    . Other surgical history  1981    BTL    Risk Factors: Tobacco Social History  Substance Use Topics  . Smoking status: Never Smoker   . Smokeless tobacco: Never Used  . Alcohol Use: Yes     Comment: rarely   She does not smoke.  Patient is not a former smoker. Are there smokers in your home (other than you)?  No Alcohol Current alcohol use: rare , social   Caffeine Current caffeine use: coffee 1-2 cups /day  Exercise Current exercise: yard work  Nutrition/Diet Current diet: in general, a "healthy" diet    Cardiac risk factors: advanced age (older than 81 for men, 16 for women), dyslipidemia, hypertension, obesity (BMI >= 30 kg/m2) and sedentary lifestyle.  Depression Screen (Note: if answer to either of the following is "Yes", a more complete depression screening is indicated)   Q1: Over the past two weeks, have you felt down, depressed or hopeless? No  Q2: Over the past two weeks, have you felt little interest or pleasure in doing things? No  Have you lost interest or pleasure in daily life? No  Do you often feel hopeless? No  Do you cry easily over  simple problems? No  Activities of Daily Living In your present state of health, do you have any difficulty performing the following activities?:  Driving? No Managing money?  No Feeding yourself? No Getting from bed to chair? No Climbing a flight of stairs? No Preparing food and eating?: No Bathing or showering? No Getting dressed: No Getting to the toilet? No Using the toilet:No Moving around from place to place: No In the past year have you fallen or had a near fall?:No   Are you sexually active?  Yes  Do you have more than one partner?  No  Vision Difficulties: No  Hearing Difficulties: No Do you often ask people to speak up or repeat themselves? No Do you experience ringing  or noises in your ears? No Do you have difficulty understanding soft or whispered voices? Sometimes.  Cognition  Do you feel that you have a problem with memory?No  Do you often misplace items? No  Do you feel safe at home?  Yes  Advanced directives Does patient have a [place]? Yes Does patient have a Living Will? Yes  ROS: Constitutional: Denies fever, chills, weight loss/gain, headaches, insomnia, fatigue, night sweats, and change in appetite. Eyes: Denies redness, blurred vision, diplopia, discharge, itchy, watery eyes.  ENT: Denies discharge, congestion, post nasal drip, epistaxis, sore throat, earache, hearing loss, dental pain, Tinnitus, Vertigo, Sinus pain, snoring.  Cardio: Denies chest pain, palpitations, irregular heartbeat, syncope, dyspnea, diaphoresis, orthopnea, PND, claudication, edema Respiratory: denies cough, dyspnea, DOE, pleurisy, hoarseness, laryngitis, wheezing.  Gastrointestinal: Denies dysphagia, heartburn, reflux, water brash, pain, cramps, nausea, vomiting, bloating, diarrhea, constipation, hematemesis, melena, hematochezia, jaundice, hemorrhoids Genitourinary: Denies dysuria, frequency, urgency, nocturia, hesitancy, discharge, hematuria, flank pain Breast: Breast lumps, nipple discharge, bleeding.  Musculoskeletal: Denies arthralgia, myalgia, stiffness, Jt. Swelling, pain, limp, and strain/sprain. Denies falls. Skin: Denies puritis, rash, hives, warts, acne, eczema, changing in skin lesion Neuro: No weakness, tremor, incoordination, spasms, paresthesia, pain Psychiatric: Denies confusion, memory loss, sensory loss. Denies Depression. Endocrine: Denies change in weight, skin, hair change, nocturia, and paresthesia, diabetic polys, visual blurring, hyper / hypo glycemic episodes.  Heme/Lymph: No excessive bleeding, bruising, enlarged lymph nodes  Objective:     BP 142/84 mmHg  Pulse 76  Temp(Src) 97 F (36.1 C)  Resp 16  Ht 5' 0.75"  (1.543 m)  Wt 152 lb 9.6 oz (69.219 kg)  BMI 29.07 kg/m2  General Appearance: Well nourished, alert, WD/WN, female and in no apparent distress. Eyes: PERRLA, EOMs, conjunctiva no swelling or erythema, normal fundi and vessels. Sinuses: No frontal/maxillary tenderness ENT/Mouth: EACs patent / TMs  nl. Nares clear without erythema, swelling, mucoid exudates. Oral hygiene is good. No erythema, swelling, or exudate. Tongue normal, non-obstructing. Tonsils not swollen or erythematous. Hearing normal.  Neck: Supple, thyroid normal. No bruits, nodes or JVD. Respiratory: Respiratory effort normal.  BS equal and clear bilateral without rales, rhonci, wheezing or stridor. Cardio: Heart sounds are normal with regular rate and rhythm and no murmurs, rubs or gallops. Peripheral pulses are normal and equal bilaterally without edema. No aortic or femoral bruits. Chest: symmetric with normal excursions and percussion. Breasts: Symmetric, without lumps, nipple discharge, retractions, or fibrocystic changes.  Abdomen: Flat, soft  with nl bowel sounds. Nontender, no guarding, rebound, hernias, masses, or organomegaly.  Lymphatics: Non tender without lymphadenopathy.  Musculoskeletal: Full ROM all peripheral extremities, joint stability, 5/5 strength, and normal gait. Skin: Warm and dry without rashes, lesions, cyanosis, clubbing or  ecchymosis.  Neuro: Cranial  nerves intact, reflexes equal bilaterally. Normal muscle tone, no cerebellar symptoms. Sensation intact.  Pysch: Alert and oriented X 3, normal affect, Insight and Judgment appropriate.   Cognitive Testing  Alert? Yes  Normal Appearance?Yes  Oriented to person? Yes  Place? Yes   Time? Yes  Recall of three objects?  Yes  Can perform simple calculations? Yes  Displays appropriate judgment? Yes  Can read the correct time from a watch/clock?Yes  Medicare Attestation I have personally reviewed: The patient's medical and social history Their use of  alcohol, tobacco or illicit drugs Their current medications and supplements The patient's functional ability including ADLs,fall risks, home safety risks, cognitive, and hearing and visual impairment Diet and physical activities Evidence for depression or mood disorders  The patient's weight, height, BMI, and visual acuity have been recorded in the chart.  I have made referrals, counseling, and provided education to the patient based on review of the above and I have provided the patient with a written personalized care plan for preventive services.  Over 40 minutes of exam, counseling, chart review was performed.   Anna Caldwell DAVID, MD   04/18/2015

## 2015-04-19 LAB — MICROALBUMIN / CREATININE URINE RATIO: Creatinine, Urine: 79.4 mg/dL

## 2015-04-19 LAB — VITAMIN D 25 HYDROXY (VIT D DEFICIENCY, FRACTURES): Vit D, 25-Hydroxy: 58 ng/mL (ref 30–100)

## 2015-04-19 LAB — URINALYSIS, ROUTINE W REFLEX MICROSCOPIC
BILIRUBIN URINE: NEGATIVE
Glucose, UA: NEGATIVE
Hgb urine dipstick: NEGATIVE
Ketones, ur: NEGATIVE
Leukocytes, UA: NEGATIVE
Nitrite: NEGATIVE
Protein, ur: NEGATIVE
SPECIFIC GRAVITY, URINE: 1.01 (ref 1.001–1.035)
pH: 6.5 (ref 5.0–8.0)

## 2015-04-19 LAB — INSULIN, RANDOM: INSULIN: 6 u[IU]/mL (ref 2.0–19.6)

## 2015-04-22 ENCOUNTER — Other Ambulatory Visit: Payer: Self-pay

## 2015-04-22 DIAGNOSIS — Z1231 Encounter for screening mammogram for malignant neoplasm of breast: Secondary | ICD-10-CM

## 2015-04-29 ENCOUNTER — Other Ambulatory Visit: Payer: Self-pay | Admitting: *Deleted

## 2015-04-29 DIAGNOSIS — Z0001 Encounter for general adult medical examination with abnormal findings: Secondary | ICD-10-CM

## 2015-04-29 DIAGNOSIS — Z1212 Encounter for screening for malignant neoplasm of rectum: Secondary | ICD-10-CM

## 2015-04-29 LAB — POC HEMOCCULT BLD/STL (HOME/3-CARD/SCREEN)
Card #3 Fecal Occult Blood, POC: NEGATIVE
FECAL OCCULT BLD: NEGATIVE
Fecal Occult Blood, POC: NEGATIVE

## 2015-05-09 DIAGNOSIS — H2513 Age-related nuclear cataract, bilateral: Secondary | ICD-10-CM | POA: Diagnosis not present

## 2015-05-09 DIAGNOSIS — H524 Presbyopia: Secondary | ICD-10-CM | POA: Diagnosis not present

## 2015-05-09 DIAGNOSIS — H33301 Unspecified retinal break, right eye: Secondary | ICD-10-CM | POA: Diagnosis not present

## 2015-05-09 DIAGNOSIS — H35371 Puckering of macula, right eye: Secondary | ICD-10-CM | POA: Diagnosis not present

## 2015-05-17 ENCOUNTER — Encounter: Payer: Self-pay | Admitting: Internal Medicine

## 2015-05-17 ENCOUNTER — Ambulatory Visit (INDEPENDENT_AMBULATORY_CARE_PROVIDER_SITE_OTHER): Payer: Medicare Other | Admitting: Internal Medicine

## 2015-05-17 VITALS — BP 138/62 | HR 80 | Temp 97.5°F | Resp 16 | Ht 61.75 in | Wt 155.4 lb

## 2015-05-17 DIAGNOSIS — J0410 Acute tracheitis without obstruction: Secondary | ICD-10-CM

## 2015-05-17 DIAGNOSIS — R059 Cough, unspecified: Secondary | ICD-10-CM

## 2015-05-17 DIAGNOSIS — R05 Cough: Secondary | ICD-10-CM

## 2015-05-17 MED ORDER — PREDNISONE 20 MG PO TABS: ORAL_TABLET | ORAL | Status: DC

## 2015-05-17 MED ORDER — LEVOFLOXACIN 500 MG PO TABS: ORAL_TABLET | ORAL | Status: DC

## 2015-05-17 MED ORDER — PROMETHAZINE-DM 6.25-15 MG/5ML PO SYRP: ORAL_SOLUTION | ORAL | Status: DC

## 2015-05-17 NOTE — Progress Notes (Signed)
   Subjective:    Patient ID: Anna Caldwell, female    DOB: 07/01/1941, 74 y.o.   MRN: [id number]  HPI  This very nice 35 yuo MWF presents with a 3-4 week hx/o chest congestion & cough with sputum from tick & white to creamy to occas greenish. Denies fever, chills, rash, dyspnea.  Occas worse at night w/recumbancy.  Medication Sig  . aspirin 81 MG chewable tablet Chew by mouth daily.  . Cholecalciferol (VITAMIN D PO) Take 10,000 Int'l Units by mouth daily.  . fluocinonide cream (LIDEX) 0.05 %   . KRILL OIL PO Take by mouth daily. Two tablets daily.  Marland Kitchen levothyroxine (SYNTHROID, LEVOTHROID) 50 MCG tablet Takes 1/2 daily  . OVER THE COUNTER MEDICATION daily. [facility] brand allergy pill   No Known Allergies   Past Medical History  Diagnosis Date  . Hypertension   . Hyperlipidemia   . Vitamin D deficiency   . Thyroid disease   . Arthritis   . Prediabetes    Review of Systems  10 point systems review negative except as above.    Objective:   Physical Exam  BP 138/62 mmHg  Pulse 80  Temp(Src) 97.5 F (36.4 C)  Resp 16  Ht 5' 1.75" (1.568 m)  Wt 155 lb 6.4 oz (70.489 kg)  BMI 28.67 kg/m2  Dry raspy cough. No respiratory distress.  HEENT - Eac's patent. TM's Nl. EOM's full. PERRLA. NasoOroPharynx clear. Neck - supple. Nl Thyroid. Carotids 2+ & No bruits, nodes, JVD Chest - Clear equal BS w/ scattered dry rales rales and no rhonchi, wheezes. Cor - Nl HS. RRR w/o sig MGR. PP 1(+). No edema. MS- FROM w/o deformities. Muscle power, tone and bulk Nl. Gait Nl. Neuro - No obvious Cr N abnormalities. Sensory, motor and Cerebellar functions appear Nl w/o focal abnormalities. Psyche - Mental status normal & appropriate.  No delusions, ideations or obvious mood abnormalities. Skin- clear - no rash    Assessment & Plan:   1. Tracheitis  - predniSONE (DELTASONE) 20 MG tablet; 1 tab 3 x day for 3 days, then 1 tab 2 x day for 3 days, then 1 tab 1 x day for 5 days  Dispense: 20 tablet;  Refill: 0 - levofloxacin (LEVAQUIN) 500 MG tablet; Take 1 tablet daily with food for infection  Dispense: 15 tablet; Refill: 1 - promethazine-dextromethorphan (PROMETHAZINE-DM) 6.25-15 MG/5ML syrup; Take 1 to 2 tsp enery 4 hours if needed for cough  Dispense: 360 mL; Refill: 1 - DG Chest 2 View; Future - recc "Mucus Relief" 400 mg x 2 tabs 3 x day pc.  - discussed meds /SE's.  2. Cough  - DG Chest 2 View; Future

## 2015-06-28 ENCOUNTER — Ambulatory Visit
Admission: RE | Admit: 2015-06-28 | Discharge: 2015-06-28 | Disposition: A | Discharge status: Home / Self Care | Payer: Medicare Other | Source: Ambulatory Visit

## 2015-06-28 DIAGNOSIS — Z1231 Encounter for screening mammogram for malignant neoplasm of breast: Secondary | ICD-10-CM

## 2015-08-31 DIAGNOSIS — D485 Neoplasm of uncertain behavior of skin: Secondary | ICD-10-CM | POA: Diagnosis not present

## 2015-08-31 DIAGNOSIS — D2222 Melanocytic nevi of left ear and external auricular canal: Secondary | ICD-10-CM | POA: Diagnosis not present

## 2015-08-31 DIAGNOSIS — L308 Other specified dermatitis: Secondary | ICD-10-CM | POA: Diagnosis not present

## 2015-08-31 DIAGNOSIS — L570 Actinic keratosis: Secondary | ICD-10-CM | POA: Diagnosis not present

## 2015-08-31 DIAGNOSIS — Z85828 Personal history of other malignant neoplasm of skin: Secondary | ICD-10-CM | POA: Diagnosis not present

## 2015-08-31 DIAGNOSIS — L821 Other seborrheic keratosis: Secondary | ICD-10-CM | POA: Diagnosis not present

## 2015-12-26 ENCOUNTER — Other Ambulatory Visit: Payer: Self-pay | Admitting: Internal Medicine

## 2016-03-07 DIAGNOSIS — Z85828 Personal history of other malignant neoplasm of skin: Secondary | ICD-10-CM | POA: Diagnosis not present

## 2016-03-07 DIAGNOSIS — L814 Other melanin hyperpigmentation: Secondary | ICD-10-CM | POA: Diagnosis not present

## 2016-03-07 DIAGNOSIS — L570 Actinic keratosis: Secondary | ICD-10-CM | POA: Diagnosis not present

## 2016-03-07 DIAGNOSIS — L821 Other seborrheic keratosis: Secondary | ICD-10-CM | POA: Diagnosis not present

## 2016-03-07 DIAGNOSIS — D1801 Hemangioma of skin and subcutaneous tissue: Secondary | ICD-10-CM | POA: Diagnosis not present

## 2016-03-07 DIAGNOSIS — D225 Melanocytic nevi of trunk: Secondary | ICD-10-CM | POA: Diagnosis not present

## 2016-03-07 DIAGNOSIS — L308 Other specified dermatitis: Secondary | ICD-10-CM | POA: Diagnosis not present

## 2016-03-15 ENCOUNTER — Ambulatory Visit: Payer: Self-pay | Admitting: Internal Medicine

## 2016-05-09 DIAGNOSIS — H2513 Age-related nuclear cataract, bilateral: Secondary | ICD-10-CM | POA: Diagnosis not present

## 2016-05-09 DIAGNOSIS — H43813 Vitreous degeneration, bilateral: Secondary | ICD-10-CM | POA: Diagnosis not present

## 2016-05-09 DIAGNOSIS — H35371 Puckering of macula, right eye: Secondary | ICD-10-CM | POA: Diagnosis not present

## 2016-05-09 DIAGNOSIS — H5203 Hypermetropia, bilateral: Secondary | ICD-10-CM | POA: Diagnosis not present

## 2016-05-15 ENCOUNTER — Encounter: Payer: Self-pay | Admitting: Internal Medicine

## 2016-05-15 ENCOUNTER — Ambulatory Visit (INDEPENDENT_AMBULATORY_CARE_PROVIDER_SITE_OTHER): Payer: Medicare Other | Admitting: Internal Medicine

## 2016-05-15 VITALS — BP 142/76 | HR 64 | Temp 97.6°F | Resp 16 | Ht 61.75 in | Wt 155.8 lb

## 2016-05-15 DIAGNOSIS — E782 Mixed hyperlipidemia: Secondary | ICD-10-CM | POA: Diagnosis not present

## 2016-05-15 DIAGNOSIS — Z136 Encounter for screening for cardiovascular disorders: Secondary | ICD-10-CM | POA: Diagnosis not present

## 2016-05-15 DIAGNOSIS — Z79899 Other long term (current) drug therapy: Secondary | ICD-10-CM | POA: Diagnosis not present

## 2016-05-15 DIAGNOSIS — Z23 Encounter for immunization: Secondary | ICD-10-CM

## 2016-05-15 DIAGNOSIS — R7309 Other abnormal glucose: Secondary | ICD-10-CM | POA: Diagnosis not present

## 2016-05-15 DIAGNOSIS — I10 Essential (primary) hypertension: Secondary | ICD-10-CM

## 2016-05-15 DIAGNOSIS — E039 Hypothyroidism, unspecified: Secondary | ICD-10-CM

## 2016-05-15 DIAGNOSIS — R7303 Prediabetes: Secondary | ICD-10-CM

## 2016-05-15 DIAGNOSIS — Z1212 Encounter for screening for malignant neoplasm of rectum: Secondary | ICD-10-CM

## 2016-05-15 DIAGNOSIS — E559 Vitamin D deficiency, unspecified: Secondary | ICD-10-CM | POA: Diagnosis not present

## 2016-05-15 LAB — TSH: TSH: 3.86 mIU/L

## 2016-05-15 LAB — HEPATIC FUNCTION PANEL
ALK PHOS: 71 U/L (ref 33–130)
ALT: 19 U/L (ref 6–29)
AST: 20 U/L (ref 10–35)
Albumin: 4.2 g/dL (ref 3.6–5.1)
BILIRUBIN DIRECT: 0.1 mg/dL (ref <=0.2)
BILIRUBIN INDIRECT: 0.3 mg/dL (ref 0.2–1.2)
TOTAL PROTEIN: 7 g/dL (ref 6.1–8.1)
Total Bilirubin: 0.4 mg/dL (ref 0.2–1.2)

## 2016-05-15 LAB — CBC WITH DIFFERENTIAL/PLATELET
BASOS PCT: 1 %
Basophils Absolute: 71 cells/uL (ref 0–200)
EOS PCT: 2 %
Eosinophils Absolute: 142 cells/uL (ref 15–500)
HEMATOCRIT: 42.9 % (ref 35.0–45.0)
HEMOGLOBIN: 14.3 g/dL (ref 11.7–15.5)
LYMPHS ABS: 2485 {cells}/uL (ref 850–3900)
Lymphocytes Relative: 35 %
MCH: 27.6 pg (ref 27.0–33.0)
MCHC: 33.3 g/dL (ref 32.0–36.0)
MCV: 82.8 fL (ref 80.0–100.0)
MONO ABS: 710 {cells}/uL (ref 200–950)
MPV: 9.4 fL (ref 7.5–12.5)
Monocytes Relative: 10 %
NEUTROS ABS: 3692 {cells}/uL (ref 1500–7800)
Neutrophils Relative %: 52 %
Platelets: 284 10*3/uL (ref 140–400)
RBC: 5.18 MIL/uL — AB (ref 3.80–5.10)
RDW: 13.9 % (ref 11.0–15.0)
WBC: 7.1 10*3/uL (ref 3.8–10.8)

## 2016-05-15 LAB — LIPID PANEL
CHOL/HDL RATIO: 4.7 ratio (ref <=5.0)
CHOLESTEROL: 200 mg/dL (ref 125–200)
HDL: 43 mg/dL — ABNORMAL LOW (ref >=46)
LDL CALC: 121 mg/dL (ref <130)
TRIGLYCERIDES: 178 mg/dL — AB (ref <150)
VLDL: 36 mg/dL — AB (ref <30)

## 2016-05-15 LAB — BASIC METABOLIC PANEL WITH GFR
BUN: 14 mg/dL (ref 7–25)
CO2: 26 mmol/L (ref 20–31)
Calcium: 9.5 mg/dL (ref 8.6–10.4)
Chloride: 105 mmol/L (ref 98–110)
Creat: 0.79 mg/dL (ref 0.60–0.93)
GFR, EST NON AFRICAN AMERICAN: 74 mL/min (ref >=60)
GFR, Est African American: 85 mL/min (ref >=60)
GLUCOSE: 91 mg/dL (ref 65–99)
POTASSIUM: 4.3 mmol/L (ref 3.5–5.3)
SODIUM: 141 mmol/L (ref 135–146)

## 2016-05-15 LAB — HEMOGLOBIN A1C
HEMOGLOBIN A1C: 5.7 % — AB (ref <5.7)
MEAN PLASMA GLUCOSE: 117 mg/dL

## 2016-05-15 NOTE — Patient Instructions (Signed)
Preventive Care for Adults A healthy lifestyle and preventive care can promote health and wellness. Preventive health guidelines for women include the following key practices.  A routine yearly physical is a good way to check with your health care provider about your health and preventive screening. It is a chance to share any concerns and updates on your health and to receive a thorough exam.  Visit your dentist for a routine exam and preventive care every 6 months. Brush your teeth twice a day and floss once a day. Good oral hygiene prevents tooth decay and gum disease.  The frequency of eye exams is based on your age, health, family medical history, use of contact lenses, and other factors. Follow your health care provider's recommendations for frequency of eye exams.  Eat a healthy diet. Foods like vegetables, fruits, whole grains, low-fat dairy products, and lean protein foods contain the nutrients you need without too many calories. Decrease your intake of foods high in solid fats, added sugars, and salt. Eat the right amount of calories for you.Get information about a proper diet from your health care provider, if necessary.  Regular physical exercise is one of the most important things you can do for your health. Most adults should get at least 150 minutes of moderate-intensity exercise (any activity that increases your heart rate and causes you to sweat) each week. In addition, most adults need muscle-strengthening exercises on 2 or more days a week.  Maintain a healthy weight. The body mass index (BMI) is a screening tool to identify possible weight problems. It provides an estimate of body fat based on height and weight. Your health care provider can find your BMI and can help you achieve or maintain a healthy weight.For adults 20 years and older:  A BMI below 18.5 is considered underweight.  A BMI of 18.5 to 24.9 is normal.  A BMI of 25 to 29.9 is considered overweight.  A BMI of  30 and above is considered obese.  Maintain normal blood lipids and cholesterol levels by exercising and minimizing your intake of saturated fat. Eat a balanced diet with plenty of fruit and vegetables. Blood tests for lipids and cholesterol should begin at age 76 and be repeated every 5 years. If your lipid or cholesterol levels are high, you are over 50, or you are at high risk for heart disease, you may need your cholesterol levels checked more frequently.Ongoing high lipid and cholesterol levels should be treated with medicines if diet and exercise are not working.  If you smoke, find out from your health care provider how to quit. If you do not use tobacco, do not start.  Lung cancer screening is recommended for adults aged 22-80 years who are at high risk for developing lung cancer because of a history of smoking. A yearly low-dose CT scan of the lungs is recommended for people who have at least a 30-pack-year history of smoking and are a current smoker or have quit within the past 15 years. A pack year of smoking is smoking an average of 1 pack of cigarettes a day for 1 year (for example: 1 pack a day for 30 years or 2 packs a day for 15 years). Yearly screening should continue until the smoker has stopped smoking for at least 15 years. Yearly screening should be stopped for people who develop a health problem that would prevent them from having lung cancer treatment.  If you are pregnant, do not drink alcohol. If you are breastfeeding,  be very cautious about drinking alcohol. If you are not pregnant and choose to drink alcohol, do not have more than 1 drink per day. One drink is considered to be 12 ounces (355 mL) of beer, 5 ounces (148 mL) of wine, or 1.5 ounces (44 mL) of liquor.  Avoid use of street drugs. Do not share needles with anyone. Ask for help if you need support or instructions about stopping the use of drugs.  High blood pressure causes heart disease and increases the risk of  stroke. Your blood pressure should be checked at least every 1 to 2 years. Ongoing high blood pressure should be treated with medicines if weight loss and exercise do not work.  If you are 75-52 years old, ask your health care provider if you should take aspirin to prevent strokes.  Diabetes screening involves taking a blood sample to check your fasting blood sugar level. This should be done once every 3 years, after age 15, if you are within normal weight and without risk factors for diabetes. Testing should be considered at a younger age or be carried out more frequently if you are overweight and have at least 1 risk factor for diabetes.  Breast cancer screening is essential preventive care for women. You should practice "breast self-awareness." This means understanding the normal appearance and feel of your breasts and may include breast self-examination. Any changes detected, no matter how small, should be reported to a health care provider. Women in their 58s and 30s should have a clinical breast exam (CBE) by a health care provider as part of a regular health exam every 1 to 3 years. After age 16, women should have a CBE every year. Starting at age 53, women should consider having a mammogram (breast X-ray test) every year. Women who have a family history of breast cancer should talk to their health care provider about genetic screening. Women at a high risk of breast cancer should talk to their health care providers about having an MRI and a mammogram every year.  Breast cancer gene (BRCA)-related cancer risk assessment is recommended for women who have family members with BRCA-related cancers. BRCA-related cancers include breast, ovarian, tubal, and peritoneal cancers. Having family members with these cancers may be associated with an increased risk for harmful changes (mutations) in the breast cancer genes BRCA1 and BRCA2. Results of the assessment will determine the need for genetic counseling and  BRCA1 and BRCA2 testing.  Routine pelvic exams to screen for cancer are no longer recommended for nonpregnant women who are considered low risk for cancer of the pelvic organs (ovaries, uterus, and vagina) and who do not have symptoms. Ask your health care provider if a screening pelvic exam is right for you.  If you have had past treatment for cervical cancer or a condition that could lead to cancer, you need Pap tests and screening for cancer for at least 20 years after your treatment. If Pap tests have been discontinued, your risk factors (such as having a new sexual partner) need to be reassessed to determine if screening should be resumed. Some women have medical problems that increase the chance of getting cervical cancer. In these cases, your health care provider may recommend more frequent screening and Pap tests.  The HPV test is an additional test that may be used for cervical cancer screening. The HPV test looks for the virus that can cause the cell changes on the cervix. The cells collected during the Pap test can be  tested for HPV. The HPV test could be used to screen women aged 30 years and older, and should be used in women of any age who have unclear Pap test results. After the age of 30, women should have HPV testing at the same frequency as a Pap test.  Colorectal cancer can be detected and often prevented. Most routine colorectal cancer screening begins at the age of 50 years and continues through age 75 years. However, your health care provider may recommend screening at an earlier age if you have risk factors for colon cancer. On a yearly basis, your health care provider may provide home test kits to check for hidden blood in the stool. Use of a small camera at the end of a tube, to directly examine the colon (sigmoidoscopy or colonoscopy), can detect the earliest forms of colorectal cancer. Talk to your health care provider about this at age 50, when routine screening begins. Direct  exam of the colon should be repeated every 5-10 years through age 75 years, unless early forms of pre-cancerous polyps or small growths are found.  People who are at an increased risk for hepatitis B should be screened for this virus. You are considered at high risk for hepatitis B if:  You were born in a country where hepatitis B occurs often. Talk with your health care provider about which countries are considered high risk.  Your parents were born in a high-risk country and you have not received a shot to protect against hepatitis B (hepatitis B vaccine).  You have HIV or AIDS.  You use needles to inject street drugs.  You live with, or have sex with, someone who has hepatitis B.  You get hemodialysis treatment.  You take certain medicines for conditions like cancer, organ transplantation, and autoimmune conditions.  Hepatitis C blood testing is recommended for all people born from 1945 through 1965 and any individual with known risks for hepatitis C.  Practice safe sex. Use condoms and avoid high-risk sexual practices to reduce the spread of sexually transmitted infections (STIs). STIs include gonorrhea, chlamydia, syphilis, trichomonas, herpes, HPV, and human immunodeficiency virus (HIV). Herpes, HIV, and HPV are viral illnesses that have no cure. They can result in disability, cancer, and death.  You should be screened for sexually transmitted illnesses (STIs) including gonorrhea and chlamydia if:  You are sexually active and are younger than 24 years.  You are older than 24 years and your health care provider tells you that you are at risk for this type of infection.  Your sexual activity has changed since you were last screened and you are at an increased risk for chlamydia or gonorrhea. Ask your health care provider if you are at risk.  If you are at risk of being infected with HIV, it is recommended that you take a prescription medicine daily to prevent HIV infection. This is  called preexposure prophylaxis (PrEP). You are considered at risk if:  You are a heterosexual woman, are sexually active, and are at increased risk for HIV infection.  You take drugs by injection.  You are sexually active with a partner who has HIV.  Talk with your health care provider about whether you are at high risk of being infected with HIV. If you choose to begin PrEP, you should first be tested for HIV. You should then be tested every 3 months for as long as you are taking PrEP.  Osteoporosis is a disease in which the bones lose minerals and strength   with aging. This can result in serious bone fractures or breaks. The risk of osteoporosis can be identified using a bone density scan. Women ages 65 years and over and women at risk for fractures or osteoporosis should discuss screening with their health care providers. Ask your health care provider whether you should take a calcium supplement or vitamin D to reduce the rate of osteoporosis.  Menopause can be associated with physical symptoms and risks. Hormone replacement therapy is available to decrease symptoms and risks. You should talk to your health care provider about whether hormone replacement therapy is right for you.  Use sunscreen. Apply sunscreen liberally and repeatedly throughout the day. You should seek shade when your shadow is shorter than you. Protect yourself by wearing long sleeves, pants, a wide-brimmed hat, and sunglasses year round, whenever you are outdoors.  Once a month, do a whole body skin exam, using a mirror to look at the skin on your back. Tell your health care provider of new moles, moles that have irregular borders, moles that are larger than a pencil eraser, or moles that have changed in shape or color.  Stay current with required vaccines (immunizations).  Influenza vaccine. All adults should be immunized every year.  Tetanus, diphtheria, and acellular pertussis (Td, Tdap) vaccine. Pregnant women should  receive 1 dose of Tdap vaccine during each pregnancy. The dose should be obtained regardless of the length of time since the last dose. Immunization is preferred during the 27th-36th week of gestation. An adult who has not previously received Tdap or who does not know her vaccine status should receive 1 dose of Tdap. This initial dose should be followed by tetanus and diphtheria toxoids (Td) booster doses every 10 years. Adults with an unknown or incomplete history of completing a 3-dose immunization series with Td-containing vaccines should begin or complete a primary immunization series including a Tdap dose. Adults should receive a Td booster every 10 years.  Varicella vaccine. An adult without evidence of immunity to varicella should receive 2 doses or a second dose if she has previously received 1 dose. Pregnant females who do not have evidence of immunity should receive the first dose after pregnancy. This first dose should be obtained before leaving the health care facility. The second dose should be obtained 4-8 weeks after the first dose.  Human papillomavirus (HPV) vaccine. Females aged 13-26 years who have not received the vaccine previously should obtain the 3-dose series. The vaccine is not recommended for use in pregnant females. However, pregnancy testing is not needed before receiving a dose. If a female is found to be pregnant after receiving a dose, no treatment is needed. In that case, the remaining doses should be delayed until after the pregnancy. Immunization is recommended for any person with an immunocompromised condition through the age of 26 years if she did not get any or all doses earlier. During the 3-dose series, the second dose should be obtained 4-8 weeks after the first dose. The third dose should be obtained 24 weeks after the first dose and 16 weeks after the second dose.  Zoster vaccine. One dose is recommended for adults aged 60 years or older unless certain conditions are  present.  Measles, mumps, and rubella (MMR) vaccine. Adults born before 1957 generally are considered immune to measles and mumps. Adults born in 1957 or later should have 1 or more doses of MMR vaccine unless there is a contraindication to the vaccine or there is laboratory evidence of immunity to   each of the three diseases. A routine second dose of MMR vaccine should be obtained at least 28 days after the first dose for students attending postsecondary schools, health care workers, or international travelers. People who received inactivated measles vaccine or an unknown type of measles vaccine during 1963-1967 should receive 2 doses of MMR vaccine. People who received inactivated mumps vaccine or an unknown type of mumps vaccine before 1979 and are at high risk for mumps infection should consider immunization with 2 doses of MMR vaccine. For females of childbearing age, rubella immunity should be determined. If there is no evidence of immunity, females who are not pregnant should be vaccinated. If there is no evidence of immunity, females who are pregnant should delay immunization until after pregnancy. Unvaccinated health care workers born before 1957 who lack laboratory evidence of measles, mumps, or rubella immunity or laboratory confirmation of disease should consider measles and mumps immunization with 2 doses of MMR vaccine or rubella immunization with 1 dose of MMR vaccine.  Pneumococcal 13-valent conjugate (PCV13) vaccine. When indicated, a person who is uncertain of her immunization history and has no record of immunization should receive the PCV13 vaccine. An adult aged 19 years or older who has certain medical conditions and has not been previously immunized should receive 1 dose of PCV13 vaccine. This PCV13 should be followed with a dose of pneumococcal polysaccharide (PPSV23) vaccine. The PPSV23 vaccine dose should be obtained at least 8 weeks after the dose of PCV13 vaccine. An adult aged 19  years or older who has certain medical conditions and previously received 1 or more doses of PPSV23 vaccine should receive 1 dose of PCV13. The PCV13 vaccine dose should be obtained 1 or more years after the last PPSV23 vaccine dose.  Pneumococcal polysaccharide (PPSV23) vaccine. When PCV13 is also indicated, PCV13 should be obtained first. All adults aged 65 years and older should be immunized. An adult younger than age 65 years who has certain medical conditions should be immunized. Any person who resides in a nursing home or long-term care facility should be immunized. An adult smoker should be immunized. People with an immunocompromised condition and certain other conditions should receive both PCV13 and PPSV23 vaccines. People with human immunodeficiency virus (HIV) infection should be immunized as soon as possible after diagnosis. Immunization during chemotherapy or radiation therapy should be avoided. Routine use of PPSV23 vaccine is not recommended for American Indians, [place] Natives, or people younger than 65 years unless there are medical conditions that require PPSV23 vaccine. When indicated, people who have unknown immunization and have no record of immunization should receive PPSV23 vaccine. One-time revaccination 5 years after the first dose of PPSV23 is recommended for people aged 19-64 years who have chronic kidney failure, nephrotic syndrome, asplenia, or immunocompromised conditions. People who received 1-2 doses of PPSV23 before age 65 years should receive another dose of PPSV23 vaccine at age 65 years or later if at least 5 years have passed since the previous dose. Doses of PPSV23 are not needed for people immunized with PPSV23 at or after age 65 years.  Meningococcal vaccine. Adults with asplenia or persistent complement component deficiencies should receive 2 doses of quadrivalent meningococcal conjugate (MenACWY-D) vaccine. The doses should be obtained at least 2 months apart.  Microbiologists working with certain meningococcal bacteria, military recruits, people at risk during an outbreak, and people who travel to or live in countries with a high rate of meningitis should be immunized. A first-year college student up through age   21 years who is living in a residence hall should receive a dose if she did not receive a dose on or after her 16th birthday. Adults who have certain high-risk conditions should receive one or more doses of vaccine.  Hepatitis A vaccine. Adults who wish to be protected from this disease, have certain high-risk conditions, work with hepatitis A-infected animals, work in hepatitis A research labs, or travel to or work in countries with a high rate of hepatitis A should be immunized. Adults who were previously unvaccinated and who anticipate close contact with an international adoptee during the first 60 days after arrival in the [place] from a country with a high rate of hepatitis A should be immunized.  Hepatitis B vaccine. Adults who wish to be protected from this disease, have certain high-risk conditions, may be exposed to blood or other infectious body fluids, are household contacts or sex partners of hepatitis B positive people, are clients or workers in certain care facilities, or travel to or work in countries with a high rate of hepatitis B should be immunized.  Haemophilus influenzae type b (Hib) vaccine. A previously unvaccinated person with asplenia or sickle cell disease or having a scheduled splenectomy should receive 1 dose of Hib vaccine. Regardless of previous immunization, a recipient of a hematopoietic stem cell transplant should receive a 3-dose series 6-12 months after her successful transplant. Hib vaccine is not recommended for adults with HIV infection. Preventive Services / Frequency Ages 64 to 68 years  Blood pressure check.** / Every 1 to 2 years.  Lipid and cholesterol check.** / Every 5 years beginning at age  22.  Clinical breast exam.** / Every 3 years for women in their 88s and 53s.  BRCA-related cancer risk assessment.** / For women who have family members with a BRCA-related cancer (breast, ovarian, tubal, or peritoneal cancers).  Pap test.** / Every 2 years from ages 90 through 51. Every 3 years starting at age 21 through age 56 or 3 with a history of 3 consecutive normal Pap tests.  HPV screening.** / Every 3 years from ages 24 through ages 1 to 46 with a history of 3 consecutive normal Pap tests.  Hepatitis C blood test.** / For any individual with known risks for hepatitis C.  Skin self-exam. / Monthly.  Influenza vaccine. / Every year.  Tetanus, diphtheria, and acellular pertussis (Tdap, Td) vaccine.** / Consult your health care provider. Pregnant women should receive 1 dose of Tdap vaccine during each pregnancy. 1 dose of Td every 10 years.  Varicella vaccine.** / Consult your health care provider. Pregnant females who do not have evidence of immunity should receive the first dose after pregnancy.  HPV vaccine. / 3 doses over 6 months, if 72 and younger. The vaccine is not recommended for use in pregnant females. However, pregnancy testing is not needed before receiving a dose.  Measles, mumps, rubella (MMR) vaccine.** / You need at least 1 dose of MMR if you were born in 1957 or later. You may also need a 2nd dose. For females of childbearing age, rubella immunity should be determined. If there is no evidence of immunity, females who are not pregnant should be vaccinated. If there is no evidence of immunity, females who are pregnant should delay immunization until after pregnancy.  Pneumococcal 13-valent conjugate (PCV13) vaccine.** / Consult your health care provider.  Pneumococcal polysaccharide (PPSV23) vaccine.** / 1 to 2 doses if you smoke cigarettes or if you have certain conditions.  Meningococcal vaccine.** /  1 dose if you are age 19 to 21 years and a first-year college  student living in a residence hall, or have one of several medical conditions, you need to get vaccinated against meningococcal disease. You may also need additional booster doses.  Hepatitis A vaccine.** / Consult your health care provider.  Hepatitis B vaccine.** / Consult your health care provider.  Haemophilus influenzae type b (Hib) vaccine.** / Consult your health care provider. Ages 40 to 64 years  Blood pressure check.** / Every 1 to 2 years.  Lipid and cholesterol check.** / Every 5 years beginning at age 20 years.  Lung cancer screening. / Every year if you are aged 55-80 years and have a 30-pack-year history of smoking and currently smoke or have quit within the past 15 years. Yearly screening is stopped once you have quit smoking for at least 15 years or develop a health problem that would prevent you from having lung cancer treatment.  Clinical breast exam.** / Every year after age 40 years.  BRCA-related cancer risk assessment.** / For women who have family members with a BRCA-related cancer (breast, ovarian, tubal, or peritoneal cancers).  Mammogram.** / Every year beginning at age 40 years and continuing for as long as you are in good health. Consult with your health care provider.  Pap test.** / Every 3 years starting at age 30 years through age 65 or 70 years with a history of 3 consecutive normal Pap tests.  HPV screening.** / Every 3 years from ages 30 years through ages 65 to 70 years with a history of 3 consecutive normal Pap tests.  Fecal occult blood test (FOBT) of stool. / Every year beginning at age 50 years and continuing until age 75 years. You may not need to do this test if you get a colonoscopy every 10 years.  Flexible sigmoidoscopy or colonoscopy.** / Every 5 years for a flexible sigmoidoscopy or every 10 years for a colonoscopy beginning at age 50 years and continuing until age 75 years.  Hepatitis C blood test.** / For all people born from 1945 through  1965 and any individual with known risks for hepatitis C.  Skin self-exam. / Monthly.  Influenza vaccine. / Every year.  Tetanus, diphtheria, and acellular pertussis (Tdap/Td) vaccine.** / Consult your health care provider. Pregnant women should receive 1 dose of Tdap vaccine during each pregnancy. 1 dose of Td every 10 years.  Varicella vaccine.** / Consult your health care provider. Pregnant females who do not have evidence of immunity should receive the first dose after pregnancy.  Zoster vaccine.** / 1 dose for adults aged 60 years or older.  Measles, mumps, rubella (MMR) vaccine.** / You need at least 1 dose of MMR if you were born in 1957 or later. You may also need a 2nd dose. For females of childbearing age, rubella immunity should be determined. If there is no evidence of immunity, females who are not pregnant should be vaccinated. If there is no evidence of immunity, females who are pregnant should delay immunization until after pregnancy.  Pneumococcal 13-valent conjugate (PCV13) vaccine.** / Consult your health care provider.  Pneumococcal polysaccharide (PPSV23) vaccine.** / 1 to 2 doses if you smoke cigarettes or if you have certain conditions.  Meningococcal vaccine.** / Consult your health care provider.  Hepatitis A vaccine.** / Consult your health care provider.  Hepatitis B vaccine.** / Consult your health care provider.  Haemophilus influenzae type b (Hib) vaccine.** / Consult your health care provider. Ages 65   years and over  Blood pressure check.** / Every 1 to 2 years.  Lipid and cholesterol check.** / Every 5 years beginning at age 20 years.  Lung cancer screening. / Every year if you are aged 55-80 years and have a 30-pack-year history of smoking and currently smoke or have quit within the past 15 years. Yearly screening is stopped once you have quit smoking for at least 15 years or develop a health problem that would prevent you from having lung cancer  treatment.  Clinical breast exam.** / Every year after age 40 years.  BRCA-related cancer risk assessment.** / For women who have family members with a BRCA-related cancer (breast, ovarian, tubal, or peritoneal cancers).  Mammogram.** / Every year beginning at age 40 years and continuing for as long as you are in good health. Consult with your health care provider.  Pap test.** / Every 3 years starting at age 30 years through age 65 or 70 years with 3 consecutive normal Pap tests. Testing can be stopped between 65 and 70 years with 3 consecutive normal Pap tests and no abnormal Pap or HPV tests in the past 10 years.  HPV screening.** / Every 3 years from ages 30 years through ages 65 or 70 years with a history of 3 consecutive normal Pap tests. Testing can be stopped between 65 and 70 years with 3 consecutive normal Pap tests and no abnormal Pap or HPV tests in the past 10 years.  Fecal occult blood test (FOBT) of stool. / Every year beginning at age 50 years and continuing until age 75 years. You may not need to do this test if you get a colonoscopy every 10 years.  Flexible sigmoidoscopy or colonoscopy.** / Every 5 years for a flexible sigmoidoscopy or every 10 years for a colonoscopy beginning at age 50 years and continuing until age 75 years.  Hepatitis C blood test.** / For all people born from 1945 through 1965 and any individual with known risks for hepatitis C.  Osteoporosis screening.** / A one-time screening for women ages 65 years and over and women at risk for fractures or osteoporosis.  Skin self-exam. / Monthly.  Influenza vaccine. / Every year.  Tetanus, diphtheria, and acellular pertussis (Tdap/Td) vaccine.** / 1 dose of Td every 10 years.  Varicella vaccine.** / Consult your health care provider.  Zoster vaccine.** / 1 dose for adults aged 60 years or older.  Pneumococcal 13-valent conjugate (PCV13) vaccine.** / Consult your health care provider.  Pneumococcal  polysaccharide (PPSV23) vaccine.** / 1 dose for all adults aged 65 years and older.  Meningococcal vaccine.** / Consult your health care provider.  Hepatitis A vaccine.** / Consult your health care provider.  Hepatitis B vaccine.** / Consult your health care provider.  Haemophilus influenzae type b (Hib) vaccine.** / Consult your health care provider.   Health Maintenance Adopting a healthy lifestyle and getting preventive care can go a long way to promote health and wellness. Talk with your health care provider about what schedule of regular examinations is right for you. This is a good chance for you to check in with your provider about disease prevention and staying healthy. In between checkups, there are plenty of things you can do on your own. Experts have done a lot of research about which lifestyle changes and preventive measures are most likely to keep you healthy. Ask your health care provider for more information. WEIGHT AND DIET  Eat a healthy diet Be sure to include plenty of vegetables,   fruits, low-fat dairy products, and lean protein. Do not eat a lot of foods high in solid fats, added sugars, or salt. Get regular exercise. This is one of the most important things you can do for your health. Most adults should exercise for at least 150 minutes each week. The exercise should increase your heart rate and make you sweat (moderate-intensity exercise). Most adults should also do strengthening exercises at least twice a week. This is in addition to the moderate-intensity exercise.  Maintain a healthy weight Body mass index (BMI) is a measurement that can be used to identify possible weight problems. It estimates body fat based on height and weight. Your health care provider can help determine your BMI and help you achieve or maintain a healthy weight. For females 20 years of age and older:  A BMI below 18.5 is considered underweight. A BMI of 18.5 to 24.9 is normal. A BMI of 25 to  29.9 is considered overweight. A BMI of 30 and above is considered obese.  Watch levels of cholesterol and blood lipids You should start having your blood tested for lipids and cholesterol at 74 years of age, then have this test every 5 years. You may need to have your cholesterol levels checked more often if: Your lipid or cholesterol levels are high. You are older than 74 years of age. You are at high risk for heart disease.  CANCER SCREENING   Lung Cancer Lung cancer screening is recommended for adults 55-80 years old who are at high risk for lung cancer because of a history of smoking. A yearly low-dose CT scan of the lungs is recommended for people who: Currently smoke. Have quit within the past 15 years. Have at least a 30-pack-year history of smoking. A pack year is smoking an average of one pack of cigarettes a day for 1 year. Yearly screening should continue until it has been 15 years since you quit. Yearly screening should stop if you develop a health problem that would prevent you from having lung cancer treatment.  Breast Cancer Practice breast self-awareness. This means understanding how your breasts normally appear and feel. It also means doing regular breast self-exams. Let your health care provider know about any changes, no matter how small. If you are in your 20s or 30s, you should have a clinical breast exam (CBE) by a health care provider every 1-3 years as part of a regular health exam. If you are 40 or older, have a CBE every year. Also consider having a breast X-ray (mammogram) every year. If you have a family history of breast cancer, talk to your health care provider about genetic screening. If you are at high risk for breast cancer, talk to your health care provider about having an MRI and a mammogram every year. Breast cancer gene (BRCA) assessment is recommended for women who have family members with BRCA-related cancers. BRCA-related cancers  include: Breast. Ovarian. Tubal. Peritoneal cancers. Results of the assessment will determine the need for genetic counseling and BRCA1 and BRCA2 testing. Cervical Cancer Routine pelvic examinations to screen for cervical cancer are no longer recommended for nonpregnant women who are considered low risk for cancer of the pelvic organs (ovaries, uterus, and vagina) and who do not have symptoms. A pelvic examination may be necessary if you have symptoms including those associated with pelvic infections. Ask your health care provider if a screening pelvic exam is right for you.  The Pap test is the screening test for cervical cancer for   women who are considered at risk. If you had a hysterectomy for a problem that was not cancer or a condition that could lead to cancer, then you no longer need Pap tests. If you are older than 65 years, and you have had normal Pap tests for the past 10 years, you no longer need to have Pap tests. If you have had past treatment for cervical cancer or a condition that could lead to cancer, you need Pap tests and screening for cancer for at least 20 years after your treatment. If you no longer get a Pap test, assess your risk factors if they change (such as having a new sexual partner). This can affect whether you should start being screened again. Some women have medical problems that increase their chance of getting cervical cancer. If this is the case for you, your health care provider may recommend more frequent screening and Pap tests. The human papillomavirus (HPV) test is another test that may be used for cervical cancer screening. The HPV test looks for the virus that can cause cell changes in the cervix. The cells collected during the Pap test can be tested for HPV. The HPV test can be used to screen women 72 years of age and older. Getting tested for HPV can extend the interval between normal Pap tests from three to five years. An HPV test also should be used to  screen women of any age who have unclear Pap test results. After 74 years of age, women should have HPV testing as often as Pap tests.  Colorectal Cancer This type of cancer can be detected and often prevented. Routine colorectal cancer screening usually begins at 74 years of age and continues through 74 years of age. Your health care provider may recommend screening at an earlier age if you have risk factors for colon cancer. Your health care provider may also recommend using home test kits to check for hidden blood in the stool. A small camera at the end of a tube can be used to examine your colon directly (sigmoidoscopy or colonoscopy). This is done to check for the earliest forms of colorectal cancer. Routine screening usually begins at age 58. Direct examination of the colon should be repeated every 5-10 years through 74 years of age. However, you may need to be screened more often if early forms of precancerous polyps or small growths are found. Skin Cancer Check your skin from head to toe regularly. Tell your health care provider about any new moles or changes in moles, especially if there is a change in a mole's shape or color. Also tell your health care provider if you have a mole that is larger than the size of a pencil eraser. Always use sunscreen. Apply sunscreen liberally and repeatedly throughout the day. Protect yourself by wearing long sleeves, pants, a wide-brimmed hat, and sunglasses whenever you are outside. HEART DISEASE, DIABETES, AND HIGH BLOOD PRESSURE  Have your blood pressure checked at least every 1-2 years. High blood pressure causes heart disease and increases the risk of stroke. If you are between 61 years and 77 years old, ask your health care provider if you should take aspirin to prevent strokes. Have regular diabetes screenings. This involves taking a blood sample to check your fasting blood sugar level. If you are at a normal weight and have a low risk for  diabetes, have this test once every three years after 74 years of age. If you are overweight and have a high risk  for diabetes, consider being tested at a younger age or more often. PREVENTING INFECTION  Hepatitis B If you have a higher risk for hepatitis B, you should be screened for this virus. You are considered at high risk for hepatitis B if: You were born in a country where hepatitis B is common. Ask your health care provider which countries are considered high risk. Your parents were born in a high-risk country, and you have not been immunized against hepatitis B (hepatitis B vaccine). You have HIV or AIDS. You use needles to inject street drugs. You live with someone who has hepatitis B. You have had sex with someone who has hepatitis B. You get hemodialysis treatment. You take certain medicines for conditions, including cancer, organ transplantation, and autoimmune conditions. Hepatitis C Blood testing is recommended for: Everyone born from 1945 through 1965. Anyone with known risk factors for hepatitis C. Sexually transmitted infections (STIs) You should be screened for sexually transmitted infections (STIs) including gonorrhea and chlamydia if: You are sexually active and are younger than 74 years of age. You are older than 74 years of age and your health care provider tells you that you are at risk for this type of infection. Your sexual activity has changed since you were last screened and you are at an increased risk for chlamydia or gonorrhea. Ask your health care provider if you are at risk. If you do not have HIV, but are at risk, it may be recommended that you take a prescription medicine daily to prevent HIV infection. This is called pre-exposure prophylaxis (PrEP). You are considered at risk if: You are sexually active and do not regularly use condoms or know the HIV status of your partner(s). You take drugs by injection. You are sexually active with a partner who has  HIV. Talk with your health care provider about whether you are at high risk of being infected with HIV. If you choose to begin PrEP, you should first be tested for HIV. You should then be tested every 3 months for as long as you are taking PrEP.  PREGNANCY  If you are premenopausal and you may become pregnant, ask your health care provider about preconception counseling. If you may become pregnant, take 400 to 800 micrograms (mcg) of folic acid every day. If you want to prevent pregnancy, talk to your health care provider about birth control (contraception). OSTEOPOROSIS AND MENOPAUSE  Osteoporosis is a disease in which the bones lose minerals and strength with aging. This can result in serious bone fractures. Your risk for osteoporosis can be identified using a bone density scan. If you are 65 years of age or older, or if you are at risk for osteoporosis and fractures, ask your health care provider if you should be screened. Ask your health care provider whether you should take a calcium or vitamin D supplement to lower your risk for osteoporosis. Menopause may have certain physical symptoms and risks. Hormone replacement therapy may reduce some of these symptoms and risks. Talk to your health care provider about whether hormone replacement therapy is right for you.  HOME CARE INSTRUCTIONS  Schedule regular health, dental, and eye exams. Stay current with your immunizations.  Do not use any tobacco products including cigarettes, chewing tobacco, or electronic cigarettes. If you are pregnant, do not drink alcohol. If you are breastfeeding, limit how much and how often you drink alcohol. Limit alcohol intake to no more than 1 drink per day for nonpregnant women. One drink equals 12   ounces of beer, 5 ounces of wine, or 1 ounces of hard liquor. Do not use street drugs. Do not share needles. Ask your health care provider for help if you need support or information about quitting drugs. Tell your  health care provider if you often feel depressed. Tell your health care provider if you have ever been abused or do not feel safe at home

## 2016-05-15 NOTE — Progress Notes (Signed)
[facility] Unk Pinto, M.D.    Uvaldo Bristle. Silverio Lay, P.A.-C      Starlyn Skeans, P.A.-C  [facility]                [address] [place], [place] [id number] Telephone [phone] Telefax [phone]  Annual Screening/Preventative Visit & Comprehensive Evaluation &  Examination     This very nice 75 y.o. MWF presents for a Screening/Preventative Visit & comprehensive evaluation and management of multiple medical co-morbidities.  Patient has been followed for labile HTN, Prediabetes, Hyperlipidemia, Hypothyroidism and Vitamin D Deficiency. Patient also has hx/o GERD infrequently taking OTC antacids for dietary indiscretions.      Patient has hx/o labile HTN being monitored expectantly predating  since 2008. Patient's BP has been controlled at home and patient denies any cardiac symptoms as chest pain, palpitations, shortness of breath, dizziness or ankle swelling. Today's BP is 142/78.       Patient's hyperlipidemia is controlled with diet and medications. Patient denies myalgias or other medication SE's. Last lipids were not at goal: Lab Results  Component Value Date   CHOL 171 04/18/2015   HDL 43 (L) 04/18/2015   LDLCALC 109 04/18/2015   TRIG 93 04/18/2015   CHOLHDL 4.0 04/18/2015      Patient has prediabetes predating circa 2011 with A1c 6.2% and patient denies reactive hypoglycemic symptoms, visual blurring, diabetic polys, or paresthesias. Last A1c was still not at goal:  Lab Results  Component Value Date   HGBA1C 6.0 (H) 04/18/2015      Patiet has been on thyroid replacement since Feb, 2014. Finally, patient has history of Vitamin D Deficiency and last Vitamin D was better, but still relatively low (goal 70-100): Lab Results  Component Value Date   VD25OH 58 04/18/2015   Current Outpatient Prescriptions on File Prior to Visit  Medication Sig  . aspirin 81 MG chewable tablet Chew by mouth  daily.  . Cholecalciferol (VITAMIN D PO) Take 10,000 Int'l Units by mouth daily.  . fluocinonide cream (LIDEX) 0.05 %   . KRILL OIL PO Take by mouth daily. Two tablets daily.  Marland Kitchen levothyroxine (SYNTHROID, LEVOTHROID) 50 MCG tablet TAKE 1/2 TO 1 TABLET BY MOUTH EVERY DAY AS DIRECTED  . OVER THE COUNTER MEDICATION daily. [facility] brand allergy pill   No current facility-administered medications on file prior to visit.    No Known Allergies Past Medical History:  Diagnosis Date  . Arthritis   . Hyperlipidemia   . Hypertension   . Prediabetes   . Thyroid disease   . Vitamin D deficiency    Health Maintenance  Topic Date Due  . TETANUS/TDAP  07/16/2012  . PNA vac Low Risk Adult (2 of 2 - PCV13) 04/09/2015  . INFLUENZA VACCINE  02/14/2016  . COLONOSCOPY  01/12/2017  . MAMMOGRAM  06/27/2017  . DEXA SCAN  Completed  . ZOSTAVAX  Completed   Immunization History  Administered Date(s) Administered  . DT 04/08/2014  . Influenza, High Dose Seasonal PF 04/18/2015  . Pneumococcal Polysaccharide-23 11/18/2006, 04/08/2014  . Td 07/16/2002  . Zoster 01/29/2007   Past Surgical History:  Procedure Laterality Date  . APPENDECTOMY    . OTHER SURGICAL HISTORY  1981   BTL   . TONSILLECTOMY     Family History  Problem Relation Age of Onset  . Heart disease Mother   .  Asthma Mother   . COPD Father   . Heart disease Father   . Gout Father   . Cancer Brother    Social History  Substance Use Topics  . Smoking status: Never Smoker  . Smokeless tobacco: Never Used  . Alcohol use Yes     Comment: rarely    ROS Constitutional: Denies fever, chills, weight loss/gain, headaches, insomnia,  night sweats, and change in appetite. Does c/o fatigue. Eyes: Denies redness, blurred vision, diplopia, discharge, itchy, watery eyes.  ENT: Denies discharge, congestion, post nasal drip, epistaxis, sore throat, earache, hearing loss, dental pain, Tinnitus, Vertigo, Sinus pain, snoring.  Cardio: Denies chest  pain, palpitations, irregular heartbeat, syncope, dyspnea, diaphoresis, orthopnea, PND, claudication, edema Respiratory: denies cough, dyspnea, DOE, pleurisy, hoarseness, laryngitis, wheezing.  Gastrointestinal: Denies dysphagia, heartburn, reflux, water brash, pain, cramps, nausea, vomiting, bloating, diarrhea, constipation, hematemesis, melena, hematochezia, jaundice, hemorrhoids Genitourinary: Denies dysuria, frequency, urgency, nocturia, hesitancy, discharge, hematuria, flank pain Breast: Breast lumps, nipple discharge, bleeding.  Musculoskeletal: Denies arthralgia, myalgia, stiffness, Jt. Swelling, pain, limp, and strain/sprain. Denies falls. Skin: Denies puritis, rash, hives, warts, acne, eczema, changing in skin lesion Neuro: No weakness, tremor, incoordination, spasms, paresthesia, pain Psychiatric: Denies confusion, memory loss, sensory loss. Denies Depression. Endocrine: Denies change in weight, skin, hair change, nocturia, and paresthesia, diabetic polys, visual blurring, hyper / hypo glycemic episodes.  Heme/Lymph: No excessive bleeding, bruising, enlarged lymph nodes.  Physical Exam  BP (!) 142/76   Pulse 64   Temp 97.6 F (36.4 C)   Resp 16   Ht 5' 1.75" (1.568 m)   Wt 155 lb 12.8 oz (70.7 kg)   BMI 28.73 kg/m   General Appearance: Well nourished and in no apparent distress.  Eyes: PERRLA, EOMs, conjunctiva no swelling or erythema, normal fundi and vessels. Sinuses: No frontal/maxillary tenderness ENT/Mouth: EACs patent / TMs  nl. Nares clear without erythema, swelling, mucoid exudates. Oral hygiene is good. No erythema, swelling, or exudate. Tongue normal, non-obstructing. Tonsils not swollen or erythematous. Hearing normal.  Neck: Supple, thyroid normal. No bruits, nodes or JVD. Respiratory: Respiratory effort normal.  BS equal and clear bilateral without rales, rhonci, wheezing or stridor. Cardio: Heart sounds are normal with regular rate and rhythm and no murmurs,  rubs or gallops. Peripheral pulses are normal and equal bilaterally without edema. No aortic or femoral bruits. Chest: symmetric with normal excursions and percussion. Breasts: Symmetric, without lumps, nipple discharge, retractions, or fibrocystic changes.  Abdomen: Flat, soft with bowel sounds active. Nontender, no guarding, rebound, hernias, masses, or organomegaly.  Lymphatics: Non tender without lymphadenopathy.  Genitourinary:  Musculoskeletal: Full ROM all peripheral extremities, joint stability, 5/5 strength, and normal gait. Skin: Warm and dry without rashes, lesions, cyanosis, clubbing or  ecchymosis.  Neuro: Cranial nerves intact, reflexes equal bilaterally. Normal muscle tone, no cerebellar symptoms. Sensation intact.  Pysch: Alert and oriented X 3, normal affect, Insight and Judgment appropriate.   Assessment and Plan  1. Essential hypertension  - Microalbumin / creatinine urine ratio - EKG 12-Lead - TSH  2. Mixed hyperlipidemia  - EKG 12-Lead - Lipid panel - TSH  3. Prediabetes  - EKG 12-Lead - Hemoglobin A1c - Insulin, random  4. Vitamin D deficiency  - VITAMIN D 25 Hydroxy  5. Acquired hypothyroidism   6. Other abnormal glucose   7. Screening for rectal cancer  - POC Hemoccult Bld/Stl  8. Screening for ischemic heart disease  - EKG 12-Lead  9. Medication management  - Urinalysis, Routine w reflex  microscopic  - CBC with Differential/Platelet - BASIC METABOLIC PANEL WITH GFR - Hepatic function panel - Magnesium  10. Need for prophylactic vaccination and inoculation against influenza  - Flu vaccine HIGH DOSE PF (Fluzone High dose)  11. Need for prophylactic vaccination against Streptococcus pneumoniae (pneumococcus)  - Pneumococcal conjugate vaccine 13-valent       Continue prudent diet as discussed, weight control, BP monitoring, regular exercise, and medications. Discussed med's effects and SE's. Screening labs and tests as requested  with regular follow-up as recommended. Over 40 minutes of exam, counseling, chart review and high complex critical decision making was performed.

## 2016-05-16 LAB — VITAMIN D 25 HYDROXY (VIT D DEFICIENCY, FRACTURES): Vit D, 25-Hydroxy: 65 ng/mL (ref 30–100)

## 2016-05-16 LAB — URINALYSIS, ROUTINE W REFLEX MICROSCOPIC
BILIRUBIN URINE: NEGATIVE
Glucose, UA: NEGATIVE
Hgb urine dipstick: NEGATIVE
Ketones, ur: NEGATIVE
LEUKOCYTES UA: NEGATIVE
Nitrite: NEGATIVE
Protein, ur: NEGATIVE
SPECIFIC GRAVITY, URINE: 1.011 (ref 1.001–1.035)
pH: 6.5 (ref 5.0–8.0)

## 2016-05-16 LAB — MAGNESIUM: Magnesium: 1.9 mg/dL (ref 1.5–2.5)

## 2016-05-16 LAB — MICROALBUMIN / CREATININE URINE RATIO
CREATININE, URINE: 70 mg/dL (ref 20–320)
MICROALB UR: 0.3 mg/dL
Microalb Creat Ratio: 4 mcg/mg creat (ref <30)

## 2016-05-16 LAB — INSULIN, RANDOM: Insulin: 12.7 u[IU]/mL (ref 2.0–19.6)

## 2016-05-22 ENCOUNTER — Other Ambulatory Visit: Payer: Self-pay | Admitting: Internal Medicine

## 2016-05-22 DIAGNOSIS — Z1231 Encounter for screening mammogram for malignant neoplasm of breast: Secondary | ICD-10-CM

## 2016-05-23 ENCOUNTER — Ambulatory Visit (INDEPENDENT_AMBULATORY_CARE_PROVIDER_SITE_OTHER): Payer: Medicare Other | Admitting: Physician Assistant

## 2016-05-23 ENCOUNTER — Encounter: Payer: Self-pay | Admitting: Physician Assistant

## 2016-05-23 VITALS — BP 132/76 | HR 84 | Temp 98.1°F | Resp 16 | Ht 61.75 in | Wt 156.0 lb

## 2016-05-23 DIAGNOSIS — Z79899 Other long term (current) drug therapy: Secondary | ICD-10-CM

## 2016-05-23 DIAGNOSIS — Z6829 Body mass index (BMI) 29.0-29.9, adult: Secondary | ICD-10-CM

## 2016-05-23 DIAGNOSIS — E039 Hypothyroidism, unspecified: Secondary | ICD-10-CM | POA: Diagnosis not present

## 2016-05-23 DIAGNOSIS — Z0001 Encounter for general adult medical examination with abnormal findings: Secondary | ICD-10-CM

## 2016-05-23 DIAGNOSIS — R7309 Other abnormal glucose: Secondary | ICD-10-CM | POA: Diagnosis not present

## 2016-05-23 DIAGNOSIS — E782 Mixed hyperlipidemia: Secondary | ICD-10-CM

## 2016-05-23 DIAGNOSIS — R6889 Other general symptoms and signs: Secondary | ICD-10-CM

## 2016-05-23 DIAGNOSIS — E559 Vitamin D deficiency, unspecified: Secondary | ICD-10-CM | POA: Diagnosis not present

## 2016-05-23 DIAGNOSIS — I10 Essential (primary) hypertension: Secondary | ICD-10-CM

## 2016-05-23 DIAGNOSIS — Z0000 Encounter for general adult medical examination without abnormal findings: Secondary | ICD-10-CM

## 2016-05-23 LAB — HEPATIC FUNCTION PANEL
ALK PHOS: 66 U/L (ref 33–130)
ALT: 17 U/L (ref 6–29)
AST: 19 U/L (ref 10–35)
Albumin: 4.2 g/dL (ref 3.6–5.1)
BILIRUBIN DIRECT: 0.1 mg/dL (ref <=0.2)
BILIRUBIN INDIRECT: 0.3 mg/dL (ref 0.2–1.2)
BILIRUBIN TOTAL: 0.4 mg/dL (ref 0.2–1.2)
Total Protein: 6.8 g/dL (ref 6.1–8.1)

## 2016-05-23 LAB — CBC WITH DIFFERENTIAL/PLATELET
BASOS ABS: 79 {cells}/uL (ref 0–200)
Basophils Relative: 1 %
Eosinophils Absolute: 158 cells/uL (ref 15–500)
Eosinophils Relative: 2 %
HEMATOCRIT: 42.4 % (ref 35.0–45.0)
HEMOGLOBIN: 14.2 g/dL (ref 11.7–15.5)
LYMPHS ABS: 2370 {cells}/uL (ref 850–3900)
LYMPHS PCT: 30 %
MCH: 27.6 pg (ref 27.0–33.0)
MCHC: 33.5 g/dL (ref 32.0–36.0)
MCV: 82.5 fL (ref 80.0–100.0)
MONO ABS: 711 {cells}/uL (ref 200–950)
MPV: 9.3 fL (ref 7.5–12.5)
Monocytes Relative: 9 %
NEUTROS PCT: 58 %
Neutro Abs: 4582 cells/uL (ref 1500–7800)
Platelets: 315 10*3/uL (ref 140–400)
RBC: 5.14 MIL/uL — ABNORMAL HIGH (ref 3.80–5.10)
RDW: 14.7 % (ref 11.0–15.0)
WBC: 7.9 10*3/uL (ref 3.8–10.8)

## 2016-05-23 LAB — BASIC METABOLIC PANEL WITH GFR
BUN: 15 mg/dL (ref 7–25)
CO2: 24 mmol/L (ref 20–31)
CREATININE: 0.77 mg/dL (ref 0.60–0.93)
Calcium: 9.6 mg/dL (ref 8.6–10.4)
Chloride: 105 mmol/L (ref 98–110)
GFR, EST AFRICAN AMERICAN: 88 mL/min (ref >=60)
GFR, Est Non African American: 76 mL/min (ref >=60)
GLUCOSE: 87 mg/dL (ref 65–99)
Potassium: 4.7 mmol/L (ref 3.5–5.3)
Sodium: 142 mmol/L (ref 135–146)

## 2016-05-23 LAB — LIPID PANEL
CHOL/HDL RATIO: 4 ratio (ref <5.0)
Cholesterol: 183 mg/dL (ref <200)
HDL: 46 mg/dL — AB (ref >50)
LDL CALC: 117 mg/dL — AB
TRIGLYCERIDES: 98 mg/dL (ref <150)
VLDL: 20 mg/dL (ref <30)

## 2016-05-23 LAB — MAGNESIUM: Magnesium: 2 mg/dL (ref 1.5–2.5)

## 2016-05-23 LAB — HEMOGLOBIN A1C
HEMOGLOBIN A1C: 5.6 % (ref <5.7)
Mean Plasma Glucose: 114 mg/dL

## 2016-05-23 LAB — TSH: TSH: 3.55 mIU/L

## 2016-05-23 MED ORDER — LEVOTHYROXINE SODIUM 50 MCG PO TABS: ORAL_TABLET | ORAL | 1 refills | Status: DC

## 2016-05-23 NOTE — Progress Notes (Signed)
MEDICARE ANNUAL WELLNESS VISIT AND CPE  Assessment:   Essential hypertension - continue medications, DASH diet, exercise and monitor at home. Call if greater than 130/80.  -     CBC with Differential/Platelet -     BASIC METABOLIC PANEL WITH GFR -     Hepatic function panel  Acquired hypothyroidism Hypothyroidism-check TSH level, continue medications the same, reminded to take on an empty stomach 30-7mins before food.  -     TSH  Mixed hyperlipidemia -continue medications, check lipids, decrease fatty foods, increase activity.  -     Lipid panel  Vitamin D deficiency Continue supplement  Other abnormal glucose -     Hemoglobin A1c  Medication management -     Magnesium  BMI 29.0-29.9,adult  Encounter for Medicare annual wellness exam   Over 40 minutes of exam, counseling, chart review and critical decision making was performed Future Appointments Date Time Provider [place]  07/03/2016 12:10 PM [facility]  11/12/2016 10:30 AM Courtney Forcucci, PA-C [facility] None     Plan:   During the course of the visit the patient was educated and counseled about appropriate screening and preventive services including:    Pneumococcal vaccine   Prevnar 13  Influenza vaccine  Td vaccine  Screening electrocardiogram  Bone densitometry screening  Colorectal cancer screening  Diabetes screening  Glaucoma screening  Nutrition counseling   Advanced directives: requested   Subjective:  Anna Caldwell is a 75 y.o. female who presents for Medicare Annual Wellness Visit and complete physical.    Her blood pressure has been controlled at home, today their BP is BP: 132/76  She does not workout. She denies chest pain, shortness of breath, dizziness.  She is not on cholesterol medication and denies myalgias. Her cholesterol is at goal. The cholesterol last visit was:   Lab Results  Component Value Date   CHOL 200 05/15/2016    HDL 43 (L) 05/15/2016   LDLCALC 121 05/15/2016   TRIG 178 (H) 05/15/2016   CHOLHDL 4.7 05/15/2016    She has been working on diet and exercise for prediabetes, and denies paresthesia of the feet, polydipsia, polyuria and visual disturbances. Last A1C in the office was:  Lab Results  Component Value Date   HGBA1C 5.7 (H) 05/15/2016     Last GFR: Lab Results  Component Value Date   [facility] 74 05/15/2016   Patient is on Vitamin D supplement.   Lab Results  Component Value Date   VD25OH 73 05/15/2016     She is on thyroid medication. Her medication was not changed last visit.   Lab Results  Component Value Date   TSH 3.86 05/15/2016  .  BMI is Body mass index is 28.76 kg/m., she is working on diet and exercise. Wt Readings from Last 3 Encounters:  05/23/16 156 lb (70.8 kg)  05/15/16 155 lb 12.8 oz (70.7 kg)  05/17/15 155 lb 6.4 oz (70.5 kg)    Medication Review: Current Outpatient Prescriptions on File Prior to Visit  Medication Sig Dispense Refill  . aspirin 81 MG chewable tablet Chew by mouth daily.    . Cholecalciferol (VITAMIN D PO) Take 10,000 Int'l Units by mouth daily.    . fluocinonide cream (LIDEX) 0.05 %     . KRILL OIL PO Take by mouth daily. Two tablets daily.    Marland Kitchen levothyroxine (SYNTHROID, LEVOTHROID) 50 MCG tablet TAKE 1/2 TO 1 TABLET BY MOUTH EVERY DAY AS DIRECTED 90 tablet 1  .  OVER THE COUNTER MEDICATION daily. [facility] brand allergy pill     No current facility-administered medications on file prior to visit.     No Known Allergies  Current Problems (verified) Patient Active Problem List   Diagnosis Date Noted  . BMI 29.0-29.9,adult 04/18/2015  . Medication management 06/22/2013  . Hypertension   . Hyperlipidemia   . Vitamin D deficiency   . Hypothyroidism   . Other abnormal glucose     Screening Tests Immunization History  Administered Date(s) Administered  . DT 04/08/2014  . Influenza, High Dose Seasonal PF 04/18/2015, 05/15/2016  .  Pneumococcal Conjugate-13 05/15/2016  . Pneumococcal Polysaccharide-23 11/18/2006, 04/08/2014  . Td 07/16/2002  . Zoster 01/29/2007   Preventative care: Last colonoscopy: 12/2006 Last mammogram: 06/2015, has appt in dec Last pap smear/pelvic exam: 2011, will not have another DEXA:2011  Prior vaccinations: TD or Tdap: 2015  Influenza: 2017 Pneumococcal: 2015 Prevnar13: 2017 Shingles/Zostavax: 2008  Names of Other Physician/Practitioners you currently use: 1. [facility] here for primary care 2. [facility], eye doctor, last visit 2015, has appt Nov 14/2017 3. [place] [facility], last visit 2016, has appt Dec 2017 Patient Care Team: Unk Pinto, MD as PCP - General (Internal Medicine) Inda Castle, MD as Consulting Physician (Gastroenterology) Marygrace Drought, MD as Consulting Physician (Ophthalmology) Rolm Bookbinder, MD as Consulting Physician (Dermatology)  SURGICAL HISTORY She  has a past surgical history that includes Appendectomy; Tonsillectomy; and Other surgical history (1981). FAMILY HISTORY Her family history includes Asthma in her mother; COPD in her father; Cancer in her brother; Gout in her father; Heart disease in her father and mother. SOCIAL HISTORY She  reports that she has never smoked. She has never used smokeless tobacco. She reports that she drinks alcohol. She reports that she does not use drugs.   MEDICARE WELLNESS OBJECTIVES: Physical activity: Current Exercise Habits: The patient does not participate in regular exercise at present (Will do house work and yard work) Cardiac risk factors: Cardiac Risk Factors include: advanced age (>8men, >52 women);dyslipidemia;hypertension;sedentary lifestyle Depression/mood screen:   Depression screen [facility] 2/9 05/23/2016  Decreased Interest 0  Down, Depressed, Hopeless 0  PHQ - 2 Score 0    ADLs:  In your present state of health, do you have any difficulty performing the following  activities: 05/23/2016 05/15/2016  Hearing? N N  Vision? N N  Difficulty concentrating or making decisions? N N  Walking or climbing stairs? N N  Dressing or bathing? N N  Doing errands, shopping? N N  Some recent data might be hidden    Cognitive Testing  Alert? Yes  Normal Appearance?Yes  Oriented to person? Yes  Place? Yes   Time? Yes  Recall of three objects?  Yes  Can perform simple calculations? Yes  Displays appropriate judgment?Yes  Can read the correct time from a watch face?Yes  EOL planning: Does patient have an advance directive?: Yes Copy of advanced directive(s) in chart?: No - copy requested  Review of Systems  Constitutional: Negative.   HENT: Negative.   Eyes: Negative.   Respiratory: Negative.   Cardiovascular: Negative.   Gastrointestinal: Negative.   Genitourinary: Negative.   Musculoskeletal: Negative.   Skin: Negative.   Neurological: Negative.   Endo/Heme/Allergies: Negative.   Psychiatric/Behavioral: Negative.      Objective:     Today's Vitals   05/23/16 1055  BP: 132/76  Pulse: 84  Resp: 16  Temp: 98.1 F (36.7 C)  SpO2: 95%  Weight: 156 lb (  70.8 kg)  Height: 5' 1.75" (1.568 m)  PainSc: 0-No pain   Body mass index is 28.76 kg/m.  General appearance: alert, no distress, WD/WN, female HEENT: normocephalic, sclerae anicteric, TMs pearly, nares patent, no discharge or erythema, pharynx normal Oral cavity: MMM, no lesions Neck: supple, no lymphadenopathy, no thyromegaly, no masses Heart: RRR, normal S1, S2, no murmurs Lungs: CTA bilaterally, no wheezes, rhonchi, or rales Abdomen: +bs, soft, non tender, non distended, no masses, no hepatomegaly, no splenomegaly Musculoskeletal: nontender, no swelling, no obvious deformity Extremities: no edema, no cyanosis, no clubbing Pulses: 2+ symmetric, upper and lower extremities, normal cap refill Neurological: alert, oriented x 3, CN2-12 intact, strength normal upper extremities and lower  extremities, sensation normal throughout, DTRs 2+ throughout, no cerebellar signs, gait normal Psychiatric: normal affect, behavior normal, pleasant   Medicare Attestation I have personally reviewed: The patient's medical and social history Their use of alcohol, tobacco or illicit drugs Their current medications and supplements The patient's functional ability including ADLs,fall risks, home safety risks, cognitive, and hearing and visual impairment Diet and physical activities Evidence for depression or mood disorders  The patient's weight, height, BMI, and visual acuity have been recorded in the chart.  I have made referrals, counseling, and provided education to the patient based on review of the above and I have provided the patient with a written personalized care plan for preventive services.     Vicie Mutters, PA-C   05/23/2016

## 2016-05-23 NOTE — Patient Instructions (Signed)
  Vitamin D goal is between 60-80  Please make sure that you are taking your Vitamin D as directed.   It is very important as a natural anti-inflammatory   helping hair, skin, and nails, as well as reducing stroke and heart attack risk.   It helps your bones and helps with mood.  It also decreases numerous cancer risks so please take it as directed.   Low Vit D is associated with a 200-300% higher risk for CANCER   and 200-300% higher risk for HEART   ATTACK  &  STROKE.    .....................................Marland Kitchen  It is also associated with higher death rate at younger ages,   autoimmune diseases like Rheumatoid arthritis, Lupus, Multiple Sclerosis.     Also many other serious conditions, like depression, Alzheimer's  Dementia, infertility, muscle aches, fatigue, fibromyalgia - just to name a few.  +++++++++++++++++++  Can get liquid vitamin D from [place] here in [place] at  [facility] alternatives [address], [place], [place] [address]

## 2016-05-25 ENCOUNTER — Other Ambulatory Visit: Payer: Self-pay | Admitting: *Deleted

## 2016-05-25 DIAGNOSIS — Z1212 Encounter for screening for malignant neoplasm of rectum: Secondary | ICD-10-CM

## 2016-05-25 LAB — POC HEMOCCULT BLD/STL (HOME/3-CARD/SCREEN)
Card #2 Fecal Occult Blod, POC: NEGATIVE
Card #3 Fecal Occult Blood, POC: NEGATIVE
FECAL OCCULT BLD: NEGATIVE

## 2016-07-03 ENCOUNTER — Ambulatory Visit
Admission: RE | Admit: 2016-07-03 | Discharge: 2016-07-03 | Disposition: A | Discharge status: Home / Self Care | Payer: Medicare Other | Source: Ambulatory Visit | Attending: Internal Medicine | Admitting: Internal Medicine

## 2016-07-03 DIAGNOSIS — Z1231 Encounter for screening mammogram for malignant neoplasm of breast: Secondary | ICD-10-CM | POA: Diagnosis not present

## 2016-08-09 DIAGNOSIS — Z85828 Personal history of other malignant neoplasm of skin: Secondary | ICD-10-CM | POA: Diagnosis not present

## 2016-08-09 DIAGNOSIS — L309 Dermatitis, unspecified: Secondary | ICD-10-CM | POA: Diagnosis not present

## 2016-08-09 DIAGNOSIS — L400 Psoriasis vulgaris: Secondary | ICD-10-CM | POA: Diagnosis not present

## 2016-10-19 DIAGNOSIS — H0012 Chalazion right lower eyelid: Secondary | ICD-10-CM | POA: Diagnosis not present

## 2016-10-26 DIAGNOSIS — H0012 Chalazion right lower eyelid: Secondary | ICD-10-CM | POA: Diagnosis not present

## 2016-11-12 ENCOUNTER — Ambulatory Visit: Payer: Self-pay | Admitting: Internal Medicine

## 2016-11-12 NOTE — Progress Notes (Signed)
Assessment and Plan:   Hypertension -Continue medication, monitor blood pressure at home. Continue DASH diet.  Reminder to go to the ER if any CP, SOB, nausea, dizziness, severe HA, changes vision/speech, left arm numbness and tingling and jaw pain.  Cholesterol -Continue diet and exercise. Check cholesterol.   Vitamin D Def - check level and continue medications.   Acquired hypothyroidism -     TSH Hypothyroidism-check TSH level, continue medications the same, reminded to take on an empty stomach 30-38mins before food.   Medication management -     Magnesium  Anemia, unspecified type -     Iron and TIBC -     Vitamin B12  Right shoulder Exercises given, mobic, if not better ortho Likely rotator cuff tendonitis versus bicep tendonitis  Continue diet and meds as discussed. Further disposition pending results of labs. Over 30 minutes of exam, counseling, chart review, and critical decision making was performed  Future Appointments Date Time Provider [place]  05/22/2017 11:00 AM Unk Pinto, MD [facility] None     HPI 76 y.o. female  presents for 3 month follow up on hypertension, cholesterol, prediabetes, and vitamin D deficiency.  She has had right shoulder pain x 1 month, worse with right abduction, with pain that will shoot down her right biceps and worse with reaching back behind her, no numbness tingling down her arm.  Her blood pressure has been controlled at home, today their BP is BP: 140/70  She does not workout but stays busy. She denies chest pain, shortness of breath, dizziness.  She is not on cholesterol medication and denies myalgias. Her cholesterol is at goal. The cholesterol last visit was:   Lab Results  Component Value Date   CHOL 183 05/23/2016   HDL 46 (L) 05/23/2016   LDLCALC 117 (H) 05/23/2016   TRIG 98 05/23/2016   CHOLHDL 4.0 05/23/2016    She has been working on diet and exercise for prediabetes, and denies paresthesia of the  feet, polydipsia, polyuria and visual disturbances. Last A1C in the office was:  Lab Results  Component Value Date   HGBA1C 5.6 05/23/2016   Patient is on Vitamin D supplement.   Lab Results  Component Value Date   VD25OH 61 05/15/2016     She is on thyroid medication. Her medication was not changed last visit.  She has noticed her hair thinning for about 5 years.  Lab Results  Component Value Date   TSH 3.55 05/23/2016  .  BMI is Body mass index is 28.76 kg/m., she is working on diet and exercise. Wt Readings from Last 3 Encounters:  11/13/16 156 lb (70.8 kg)  05/23/16 156 lb (70.8 kg)  05/15/16 155 lb 12.8 oz (70.7 kg)    Current Medications:  Current Outpatient Prescriptions on File Prior to Visit  Medication Sig  . aspirin 81 MG chewable tablet Chew by mouth daily.  . Cholecalciferol (VITAMIN D PO) Take 10,000 Int'l Units by mouth daily.  . fluocinonide cream (LIDEX) 0.05 %   . KRILL OIL PO Take by mouth daily. Two tablets daily.  Marland Kitchen levothyroxine (SYNTHROID, LEVOTHROID) 50 MCG tablet TAKE 1/2 TO 1 TABLET BY MOUTH EVERY DAY AS DIRECTED  . OVER THE COUNTER MEDICATION daily. [facility] brand allergy pill   No current facility-administered medications on file prior to visit.     Medical History:  Past Medical History:  Diagnosis Date  . Arthritis   . Hyperlipidemia   . Hypertension   .  Prediabetes   . Thyroid disease   . Vitamin D deficiency    Allergies: No Known Allergies   Review of Systems:  Review of Systems  Constitutional: Negative.   HENT: Negative.   Eyes: Negative.   Respiratory: Negative.   Cardiovascular: Negative.   Gastrointestinal: Negative.   Genitourinary: Negative.   Musculoskeletal: Positive for joint pain.  Skin: Negative.   Neurological: Negative.   Endo/Heme/Allergies: Negative.   Psychiatric/Behavioral: Negative.     Family history- Review and unchanged Social history- Review and unchanged Physical Exam: BP 140/70   Pulse 86   Temp  97.1 F (36.2 C)   Resp 16   Ht 5' 1.75" (1.568 m)   Wt 156 lb (70.8 kg)   SpO2 97%   BMI 28.76 kg/m  Wt Readings from Last 3 Encounters:  11/13/16 156 lb (70.8 kg)  05/23/16 156 lb (70.8 kg)  05/15/16 155 lb 12.8 oz (70.7 kg)   General Appearance: Well nourished, in no apparent distress. Eyes: PERRLA, EOMs, conjunctiva no swelling or erythema Sinuses: No Frontal/maxillary tenderness ENT/Mouth: Ext aud canals clear, TMs without erythema, bulging. No erythema, swelling, or exudate on post pharynx.  Tonsils not swollen or erythematous. Hearing normal.  Neck: Supple, thyroid normal.  Respiratory: Respiratory effort normal, BS equal bilaterally without rales, rhonchi, wheezing or stridor.  Cardio: RRR with no MRGs. Brisk peripheral pulses without edema.  Abdomen: Soft, + BS,  Non tender, no guarding, rebound, hernias, masses. Lymphatics: Non tender without lymphadenopathy.  Musculoskeletal: Full ROM, 5/5 strength, Normal gait Skin: Warm, dry without rashes, lesions, ecchymosis.  Neuro: Cranial nerves intact. Normal muscle tone, no cerebellar symptoms. Psych: Awake and oriented X 3, normal affect, Insight and Judgment appropriate.    Vicie Mutters, PA-C 11:14 AM [facility]

## 2016-11-13 ENCOUNTER — Ambulatory Visit (INDEPENDENT_AMBULATORY_CARE_PROVIDER_SITE_OTHER): Payer: Medicare Other | Admitting: Physician Assistant

## 2016-11-13 ENCOUNTER — Encounter: Payer: Self-pay | Admitting: Physician Assistant

## 2016-11-13 VITALS — BP 140/70 | HR 86 | Temp 97.1°F | Resp 16 | Ht 61.75 in | Wt 156.0 lb

## 2016-11-13 DIAGNOSIS — I10 Essential (primary) hypertension: Secondary | ICD-10-CM

## 2016-11-13 DIAGNOSIS — D649 Anemia, unspecified: Secondary | ICD-10-CM | POA: Diagnosis not present

## 2016-11-13 DIAGNOSIS — E039 Hypothyroidism, unspecified: Secondary | ICD-10-CM

## 2016-11-13 DIAGNOSIS — E559 Vitamin D deficiency, unspecified: Secondary | ICD-10-CM | POA: Diagnosis not present

## 2016-11-13 DIAGNOSIS — Z79899 Other long term (current) drug therapy: Secondary | ICD-10-CM | POA: Diagnosis not present

## 2016-11-13 DIAGNOSIS — E782 Mixed hyperlipidemia: Secondary | ICD-10-CM

## 2016-11-13 MED ORDER — MELOXICAM 15 MG PO TABS: ORAL_TABLET | ORAL | 1 refills | Status: DC

## 2016-11-13 NOTE — Patient Instructions (Signed)
Take mobic x 2 weeks with food If not better go to ortho for injection   Biceps Tendon Disruption (Proximal) Rehab Ask your health care provider which exercises are safe for you. Do exercises exactly as told by your health care provider and adjust them as directed. It is normal to feel mild stretching, pulling, tightness, or discomfort as you do these exercises, but you should stop right away if you feel sudden pain or your pain gets worse.Do not begin these exercises until told by your health care provider. Stretching and range of motion exercises These exercises warm up your muscles and joints and improve the movement and flexibility of your arm and shoulder. These exercises also help to relieve pain and stiffness. Exercise A: Shoulder flexion, standing   1. Stand facing a wall. Put your left / right hand on the wall. 2. Slide your left / right hand up the wall. Stop when you feel a stretch in your shoulder, or when you reach the angle recommended by your health care provider.  Use your other hand to help raise your arm, if needed.  As your hand gets higher, you may need to step closer to the wall.  Avoid shrugging your shoulder while you raise your arm. To do this, keep your shoulder blade tucked down toward your spine. 3. Hold for __________ seconds. 4. Slowly return to the starting position. Use your other arm to help, if needed. Repeat __________ times. Complete this exercise __________ times a day. Exercise B: Pendulum   1. Stand near a wall or a surface that you can hold onto for balance. 2. Bend at the waist and let your left / right arm hang straight down. Use your other arm to support you. 3. Relax your arm and shoulder muscles, and move your hips and your trunk so your left / right arm swings freely. Your arm should swing because of the motion of your body, not because you are using your arm or shoulder muscles. 4. Keep moving so your arm swings in the following directions, as  told by your health care provider:  Side to side.  Forward and backward.  In clockwise and counterclockwise circles. 5. Slowly return to the starting position. Repeat __________ times. Complete this exercise __________ times a day. Strengthening exercises These exercises build strength and endurance in your arm and shoulder. Endurance is the ability to use your muscles for a long time, even after your muscles get tired. Exercise C: Elbow flexion, neutral  1. Sit on a stable chair without armrests, or stand. 2. Hold a __________ weight in your left / right hand, or hold an exercise band with both hands. Your palms should face each other at the starting position. 3. Bend your left / right elbow and move your hand up toward your shoulder.  Lead with your thumb, and keep your palm facing the same direction.  Keep your other arm straight down, in the starting position. 4. Slowly return to the starting position. Repeat __________ times. Complete this exercise __________ times a day. Exercise D: Forearm supination   1. Sit with your left / right forearm on a table. Your elbow should be below shoulder height. Rest your hand over the edge of the table so your palm faces down. 2. If directed, hold a hammer with your left / right hand. 3. Without moving your elbow, slowly rotate your hand so your palm faces up toward the ceiling.  If you are holding a hammer, begin by holding  the hammer near the head. When this exercise gets easier for you, hold the hammer farther down the handle. 4. Hold for __________ seconds. 5. Slowly return to the starting position. Repeat __________ times. Complete this exercise __________ times a day. Exercise E: Scapular retraction   1. Sit in a stable chair without armrests, or stand. 2. Secure an exercise band to a stable object in front of you so the band is at shoulder height. 3. Hold one end of the exercise band in each hand. 4. Squeeze your shoulder blades  together and move your elbows slightly behind you. Do not shrug your shoulders. 5. Hold for __________ seconds. 6. Slowly return to the starting position. Repeat __________ times. Complete this exercise __________ times a day. Exercise F: Scapular protraction, supine   1. Lie on your back on a firm surface. Hold a __________ weight in your left / right hand. 2. Raise your left / right arm straight into the air so your hand is directly above your shoulder joint. 3. Push the weight into the air so your shoulder lifts off of the surface that you are lying on. Do not move your head, neck, or back. 4. Hold for __________ seconds. 5. Slowly return to the starting position. Let your muscles relax completely before you repeat this exercise. Repeat __________ times. Complete this exercise __________ times a day. This information is not intended to replace advice given to you by your health care provider. Make sure you discuss any questions you have with your health care provider. Document Released: 07/02/2005 Document Revised: 03/08/2016 Document Reviewed: 06/10/2015 Elsevier Interactive Patient Education  2017 [employer].   Shoulder Impingement Syndrome Rehab Ask your health care provider which exercises are safe for you. Do exercises exactly as told by your health care provider and adjust them as directed. It is normal to feel mild stretching, pulling, tightness, or discomfort as you do these exercises, but you should stop right away if you feel sudden pain or your pain gets worse.Do not begin these exercises until told by your health care provider. Stretching and range of motion exercise This exercise warms up your muscles and joints and improves the movement and flexibility of your shoulder. This exercise also helps to relieve pain and stiffness. Exercise A: Passive horizontal adduction   5. Sit or stand and pull your left / right elbow across your chest, toward your other shoulder. Stop when  you feel a gentle stretch in the back of your shoulder and upper arm.  Keep your arm at shoulder height.  Keep your arm as close to your body as you comfortably can. 6. Hold for __________ seconds. 7. Slowly return to the starting position. Repeat __________ times. Complete this exercise __________ times a day. Strengthening exercises These exercises build strength and endurance in your shoulder. Endurance is the ability to use your muscles for a long time, even after they get tired. Exercise B: External rotation, isometric  6. Stand or sit in a doorway, facing the door frame. 7. Bend your left / right elbow and place the back of your wrist against the door frame. Only your wrist should be touching the frame. Keep your upper arm at your side. 8. Gently press your wrist against the door frame, as if you are trying to push your arm away from your abdomen.  Avoid shrugging your shoulder while you press your hand against the door frame. Keep your shoulder blade tucked down toward the middle of your back. 9. Hold  for __________ seconds. 10. Slowly release the tension, and relax your muscles completely before you do the exercise again. Repeat __________ times. Complete this exercise __________ times a day. Exercise C: Internal rotation, isometric   5. Stand or sit in a doorway, facing the door frame. 6. Bend your left / right elbow and place the inside of your wrist against the door frame. Only your wrist should be touching the frame. Keep your upper arm at your side. 7. Gently press your wrist against the door frame, as if you are trying to push your arm toward your abdomen.  Avoid shrugging your shoulder while you press your hand against the door frame. Keep your shoulder blade tucked down toward the middle of your back. 8. Hold for __________ seconds. 9. Slowly release the tension, and relax your muscles completely before you do the exercise again. Repeat __________ times. Complete this  exercise __________ times a day. Exercise D: Scapular protraction, supine   1. Lie on your back on a firm surface. Hold a __________ weight in your left / right hand. 2. Raise your left / right arm straight into the air so your hand is directly above your shoulder joint. 3. Push the weight into the air so your shoulder lifts off of the surface that you are lying on. Do not move your head, neck, or back. 4. Hold for __________ seconds. 5. Slowly return to the starting position. Let your muscles relax completely before you repeat this exercise. Repeat __________ times. Complete this exercise __________ times a day. Exercise E: Scapular retraction   7. Sit in a stable chair without armrests, or stand. 8. Secure an exercise band to a stable object in front of you so the band is at shoulder height. 9. Hold one end of the exercise band in each hand. Your palms should face down. 10. Squeeze your shoulder blades together and move your elbows slightly behind you. Do not shrug your shoulders while you do this. 11. Hold for __________ seconds. 12. Slowly return to the starting position. Repeat __________ times. Complete this exercise __________ times a day. Exercise F: Shoulder extension   6. Sit in a stable chair without armrests, or stand. 7. Secure an exercise band to a stable object in front of you where the band is above shoulder height. 8. Hold one end of the exercise band in each hand. 9. Straighten your elbows and lift your hands up to shoulder height. 10. Squeeze your shoulder blades together and pull your hands down to the sides of your thighs. Stop when your hands are straight down by your sides. Do not let your hands go behind your body. 11. Hold for __________ seconds. 12. Slowly return to the starting position. Repeat __________ times. Complete this exercise __________ times a day. This information is not intended to replace advice given to you by your health care provider. Make sure  you discuss any questions you have with your health care provider. Document Released: 07/02/2005 Document Revised: 03/08/2016 Document Reviewed: 06/04/2015 Elsevier Interactive Patient Education  2017 [employer].

## 2016-11-14 LAB — LIPID PANEL
Cholesterol: 157 mg/dL (ref <200)
HDL: 44 mg/dL — ABNORMAL LOW (ref >50)
LDL CALC: 88 mg/dL (ref <100)
TRIGLYCERIDES: 127 mg/dL (ref <150)
Total CHOL/HDL Ratio: 3.6 Ratio (ref <5.0)
VLDL: 25 mg/dL (ref <30)

## 2016-11-14 LAB — CBC WITH DIFFERENTIAL/PLATELET
BASOS PCT: 1 %
Basophils Absolute: 72 cells/uL (ref 0–200)
EOS ABS: 216 {cells}/uL (ref 15–500)
Eosinophils Relative: 3 %
HEMATOCRIT: 41 % (ref 35.0–45.0)
Hemoglobin: 13.5 g/dL (ref 11.7–15.5)
LYMPHS ABS: 2160 {cells}/uL (ref 850–3900)
Lymphocytes Relative: 30 %
MCH: 27.3 pg (ref 27.0–33.0)
MCHC: 32.9 g/dL (ref 32.0–36.0)
MCV: 82.8 fL (ref 80.0–100.0)
MONO ABS: 576 {cells}/uL (ref 200–950)
MPV: 9.6 fL (ref 7.5–12.5)
Monocytes Relative: 8 %
Neutro Abs: 4176 cells/uL (ref 1500–7800)
Neutrophils Relative %: 58 %
Platelets: 306 10*3/uL (ref 140–400)
RBC: 4.95 MIL/uL (ref 3.80–5.10)
RDW: 14.5 % (ref 11.0–15.0)
WBC: 7.2 10*3/uL (ref 3.8–10.8)

## 2016-11-14 LAB — IRON AND TIBC
%SAT: 17 % (ref 11–50)
Iron: 51 ug/dL (ref 45–160)
TIBC: 303 ug/dL (ref 250–450)
UIBC: 252 ug/dL (ref 125–400)

## 2016-11-14 LAB — MAGNESIUM: MAGNESIUM: 1.9 mg/dL (ref 1.5–2.5)

## 2016-11-14 LAB — BASIC METABOLIC PANEL WITH GFR
BUN: 11 mg/dL (ref 7–25)
CO2: 25 mmol/L (ref 20–31)
Calcium: 9.5 mg/dL (ref 8.6–10.4)
Chloride: 106 mmol/L (ref 98–110)
Creat: 0.84 mg/dL (ref 0.60–0.93)
GFR, EST AFRICAN AMERICAN: 79 mL/min (ref >=60)
GFR, EST NON AFRICAN AMERICAN: 68 mL/min (ref >=60)
GLUCOSE: 101 mg/dL — AB (ref 65–99)
POTASSIUM: 4.5 mmol/L (ref 3.5–5.3)
Sodium: 142 mmol/L (ref 135–146)

## 2016-11-14 LAB — TSH: TSH: 4.16 m[IU]/L

## 2016-11-14 LAB — HEPATIC FUNCTION PANEL
ALBUMIN: 4.1 g/dL (ref 3.6–5.1)
ALT: 17 U/L (ref 6–29)
AST: 17 U/L (ref 10–35)
Alkaline Phosphatase: 73 U/L (ref 33–130)
Bilirubin, Direct: 0.1 mg/dL (ref <=0.2)
Indirect Bilirubin: 0.2 mg/dL (ref 0.2–1.2)
TOTAL PROTEIN: 6.8 g/dL (ref 6.1–8.1)
Total Bilirubin: 0.3 mg/dL (ref 0.2–1.2)

## 2016-11-14 LAB — VITAMIN B12: Vitamin B-12: 371 pg/mL (ref 200–1100)

## 2016-11-14 NOTE — Progress Notes (Signed)
Pt aware of lab results & voiced understanding of those results.

## 2016-11-19 DIAGNOSIS — H0012 Chalazion right lower eyelid: Secondary | ICD-10-CM | POA: Diagnosis not present

## 2017-03-15 DIAGNOSIS — H0015 Chalazion left lower eyelid: Secondary | ICD-10-CM | POA: Diagnosis not present

## 2017-03-15 DIAGNOSIS — H5203 Hypermetropia, bilateral: Secondary | ICD-10-CM | POA: Diagnosis not present

## 2017-03-17 ENCOUNTER — Other Ambulatory Visit: Payer: Self-pay | Admitting: Physician Assistant

## 2017-04-18 ENCOUNTER — Other Ambulatory Visit: Payer: Self-pay | Admitting: Internal Medicine

## 2017-04-18 DIAGNOSIS — Z1231 Encounter for screening mammogram for malignant neoplasm of breast: Secondary | ICD-10-CM

## 2017-05-08 DIAGNOSIS — D2272 Melanocytic nevi of left lower limb, including hip: Secondary | ICD-10-CM | POA: Diagnosis not present

## 2017-05-08 DIAGNOSIS — D220 Melanocytic nevi of lip: Secondary | ICD-10-CM | POA: Diagnosis not present

## 2017-05-08 DIAGNOSIS — D692 Other nonthrombocytopenic purpura: Secondary | ICD-10-CM | POA: Diagnosis not present

## 2017-05-08 DIAGNOSIS — D225 Melanocytic nevi of trunk: Secondary | ICD-10-CM | POA: Diagnosis not present

## 2017-05-08 DIAGNOSIS — L918 Other hypertrophic disorders of the skin: Secondary | ICD-10-CM | POA: Diagnosis not present

## 2017-05-08 DIAGNOSIS — Z85828 Personal history of other malignant neoplasm of skin: Secondary | ICD-10-CM | POA: Diagnosis not present

## 2017-05-08 DIAGNOSIS — L821 Other seborrheic keratosis: Secondary | ICD-10-CM | POA: Diagnosis not present

## 2017-05-08 DIAGNOSIS — L570 Actinic keratosis: Secondary | ICD-10-CM | POA: Diagnosis not present

## 2017-05-08 DIAGNOSIS — L814 Other melanin hyperpigmentation: Secondary | ICD-10-CM | POA: Diagnosis not present

## 2017-05-08 DIAGNOSIS — L820 Inflamed seborrheic keratosis: Secondary | ICD-10-CM | POA: Diagnosis not present

## 2017-05-08 DIAGNOSIS — D1801 Hemangioma of skin and subcutaneous tissue: Secondary | ICD-10-CM | POA: Diagnosis not present

## 2017-05-21 NOTE — Patient Instructions (Signed)

## 2017-05-21 NOTE — Progress Notes (Signed)
[facility] Unk Pinto, M.D.     Anna Caldwell. Silverio Lay, P.A.-C Liane Comber, [place] [address] [place], [place] [address] Telephone [phone] Telefax [phone]  Comprehensive Evaluation &  Examination     This very nice 76 y.o. MWF  presents for a  comprehensive evaluation and management of multiple medical co-morbidities.  Patient has been followed expectantly for labile  HTN, HLD, Prediabetes, Hypothyroidism and Vitamin D Deficiency. Patient has GERD controlled with occas OTC Antacids.       Patient is followed expectantly for labile HTN predates circa 2008. Patient's BP has been controlled at home and patient denies any cardiac symptoms as chest pain, palpitations, shortness of breath, dizziness or ankle swelling. Today's BP is at goal - 126/72.      Patient's hyperlipidemia is controlled with diet. Last lipids were at goal: Lab Results  Component Value Date   CHOL 150 05/22/2017   HDL 47 (L) 05/22/2017   LDLCALC 88 11/13/2016   TRIG 119 05/22/2017   CHOLHDL 3.2 05/22/2017      Patient has prediabetes (A1c 6.2% / 2011 and 6.0% / 2016) and patient denies reactive hypoglycemic symptoms, visual blurring, diabetic polys, or paresthesias. Last A1c was normal and at goal: Lab Results  Component Value Date   HGBA1C 5.6 05/23/2016      Patient had Hypothyroidism predating since 2014. Finally, patient has history of Vitamin D Deficiency ("13": in 2008) and last Vitamin D was at goal: Lab Results  Component Value Date   VD25OH 81 05/15/2016   Current Outpatient Medications on File Prior to Visit  Medication Sig  . aspirin 81 MG   daily.  Marland Kitchen VITAMIN D Take 10,000 Int'l Units  daily.  . fluocinonide cream ( 0.05 %   . Krill Oil 300 MG  Take daily by mouth. Two tablets daily. Takes the [employer] brand.  Marland Kitchen levothyroxine  50 MCG tablet TAKE 1/2 TO 1 TAB EVERY DAY   . [employer] brand allergy pill daily.     No Known Allergies   Past Medical History:  Diagnosis Date  . Arthritis   . Hyperlipidemia   . Hypertension   . Prediabetes   . Thyroid disease   . Vitamin D deficiency    Health Maintenance  Topic Date Due  . COLONOSCOPY  01/12/2017  . INFLUENZA VACCINE  02/13/2017  . TETANUS/TDAP  04/08/2024  . DEXA SCAN  Completed  . PNA vac Low Risk Adult  Completed   Immunization History  Administered Date(s) Administered  . DT 04/08/2014  . Influenza, High Dose Seasonal PF 04/18/2015, 05/15/2016, 05/22/2017  . Pneumococcal Conjugate-13 05/15/2016  . Pneumococcal Polysaccharide-23 11/18/2006, 04/08/2014  . Td 07/16/2002  . Zoster 01/29/2007   Past Surgical History:  Procedure Laterality Date  . APPENDECTOMY    . OTHER SURGICAL HISTORY  1981   BTL   . TONSILLECTOMY     Family History  Problem Relation Age of Onset  . Heart disease Mother   . Asthma Mother   . COPD Father   . Heart disease Father   . Gout Father   . Cancer Brother    Social History   Tobacco Use  . Smoking status: Never Smoker  . Smokeless tobacco: Never Used  Substance Use Topics  . Alcohol use: Yes    Comment: rarely  . Drug use: No    ROS Constitutional: Denies fever, chills, weight loss/gain, headaches, insomnia,  night sweats, and  change in appetite. Does c/o fatigue. Eyes: Denies redness, blurred vision, diplopia, discharge, itchy, watery eyes.  ENT: Denies discharge, congestion, post nasal drip, epistaxis, sore throat, earache, hearing loss, dental pain, Tinnitus, Vertigo, Sinus pain, snoring.  Cardio: Denies chest pain, palpitations, irregular heartbeat, syncope, dyspnea, diaphoresis, orthopnea, PND, claudication, edema Respiratory: denies cough, dyspnea, DOE, pleurisy, hoarseness, laryngitis, wheezing.  Gastrointestinal: Denies dysphagia, heartburn, reflux, water brash, pain, cramps, nausea, vomiting, bloating, diarrhea, constipation, hematemesis, melena, hematochezia, jaundice,  hemorrhoids Genitourinary: Denies dysuria, frequency, urgency, nocturia, hesitancy, discharge, hematuria, flank pain Breast: Breast lumps, nipple discharge, bleeding.  Musculoskeletal: Denies arthralgia, myalgia, stiffness, Jt. Swelling, pain, limp, and strain/sprain. Denies falls. Skin: Denies puritis, rash, hives, warts, acne, eczema, changing in skin lesion Neuro: No weakness, tremor, incoordination, spasms, paresthesia, pain Psychiatric: Denies confusion, memory loss, sensory loss. Denies Depression. Endocrine: Denies change in weight, skin, hair change, nocturia, and paresthesia, diabetic polys, visual blurring, hyper / hypo glycemic episodes.  Heme/Lymph: No excessive bleeding, bruising, enlarged lymph nodes.  Physical Exam  BP 126/72   Pulse 80   Temp (!) 97.5 F (36.4 C)   Resp 16   Ht 5' 1.75" (1.568 m)   Wt 152 lb 12.8 oz (69.3 kg)   BMI 28.17 kg/m   General Appearance: Well nourished, well groomed and in no apparent distress.  Eyes: PERRLA, EOMs, conjunctiva no swelling or erythema, normal fundi and vessels. Sinuses: No frontal/maxillary tenderness ENT/Mouth: EACs patent / TMs  nl. Nares clear without erythema, swelling, mucoid exudates. Oral hygiene is good. No erythema, swelling, or exudate. Tongue normal, non-obstructing. Tonsils not swollen or erythematous. Hearing normal.  Neck: Supple, thyroid normal. No bruits, nodes or JVD. Respiratory: Respiratory effort normal.  BS equal and clear bilateral without rales, rhonci, wheezing or stridor. Cardio: Heart sounds are normal with regular rate and rhythm and no murmurs, rubs or gallops. Peripheral pulses are normal and equal bilaterally without edema. No aortic or femoral bruits. Chest: symmetric with normal excursions and percussion. Breasts: Symmetric, without lumps, nipple discharge, retractions, or fibrocystic changes.  Abdomen: Flat, soft with bowel sounds active. Nontender, no guarding, rebound, hernias, masses, or  organomegaly.  Lymphatics: Non tender without lymphadenopathy.  Genitourinary:  Musculoskeletal: Full ROM all peripheral extremities, joint stability, 5/5 strength, and normal gait. Skin: Warm and dry without rashes, lesions, cyanosis, clubbing or  ecchymosis.  Neuro: Cranial nerves intact, reflexes equal bilaterally. Normal muscle tone, no cerebellar symptoms. Sensation intact.  Pysch: Alert and oriented X 3, normal affect, Insight and Judgment appropriate.   Assessment and Plan 1. Essential hypertension  - EKG 12-Lead - Urinalysis, Routine w reflex microscopic - Microalbumin / creatinine urine ratio - CBC with Differential/Platelet - BASIC METABOLIC PANEL WITH GFR - Magnesium - TSH  2. Hyperlipidemia, mixed  - EKG 12-Lead - Hepatic function panel - Lipid panel - TSH  3. Prediabetes  - EKG 12-Lead - Hemoglobin A1c - Insulin, random  4. Vitamin D deficiency  - VITAMIN D 25 Hydroxy   5. Other abnormal glucose  - Hemoglobin A1c - Insulin, random  6. Hypothyroidism  - TSH  7. Screening for colorectal cancer  - POC Hemoccult Bld/Stl   8. Screening for ischemic heart disease  - EKG 12-Lead  9. Medication management  - Urinalysis, Routine w reflex microscopic - Microalbumin / creatinine urine ratio - CBC with Differential/Platelet - BASIC METABOLIC PANEL WITH GFR - Hepatic function panel - Magnesium - Lipid panel - TSH - Hemoglobin A1c - Insulin, random - VITAMIN D 25 Hydroxy  10. Need for immunization against influenza  - Flu vaccine HIGH DOSE PF (Fluzone High dose)          Patient was counseled in prudent diet to achieve/maintain BMI less than 25 for weight control, BP monitoring, regular exercise and medications. Discussed med's effects and SE's. Screening labs and tests as requested with regular follow-up as recommended. Over 40 minutes of exam, counseling, chart review and high complex critical decision making was performed.

## 2017-05-22 ENCOUNTER — Encounter: Payer: Self-pay | Admitting: Internal Medicine

## 2017-05-22 ENCOUNTER — Ambulatory Visit (INDEPENDENT_AMBULATORY_CARE_PROVIDER_SITE_OTHER): Payer: Medicare Other | Admitting: Internal Medicine

## 2017-05-22 VITALS — BP 126/72 | HR 80 | Temp 97.5°F | Resp 16 | Ht 61.75 in | Wt 152.8 lb

## 2017-05-22 DIAGNOSIS — Z79899 Other long term (current) drug therapy: Secondary | ICD-10-CM | POA: Diagnosis not present

## 2017-05-22 DIAGNOSIS — I10 Essential (primary) hypertension: Secondary | ICD-10-CM | POA: Diagnosis not present

## 2017-05-22 DIAGNOSIS — E782 Mixed hyperlipidemia: Secondary | ICD-10-CM

## 2017-05-22 DIAGNOSIS — R7303 Prediabetes: Secondary | ICD-10-CM

## 2017-05-22 DIAGNOSIS — Z23 Encounter for immunization: Secondary | ICD-10-CM | POA: Diagnosis not present

## 2017-05-22 DIAGNOSIS — R7309 Other abnormal glucose: Secondary | ICD-10-CM

## 2017-05-22 DIAGNOSIS — Z1211 Encounter for screening for malignant neoplasm of colon: Secondary | ICD-10-CM

## 2017-05-22 DIAGNOSIS — Z1212 Encounter for screening for malignant neoplasm of rectum: Secondary | ICD-10-CM

## 2017-05-22 DIAGNOSIS — Z136 Encounter for screening for cardiovascular disorders: Secondary | ICD-10-CM | POA: Diagnosis not present

## 2017-05-22 DIAGNOSIS — E559 Vitamin D deficiency, unspecified: Secondary | ICD-10-CM | POA: Diagnosis not present

## 2017-05-22 DIAGNOSIS — E039 Hypothyroidism, unspecified: Secondary | ICD-10-CM | POA: Diagnosis not present

## 2017-05-23 LAB — CBC WITH DIFFERENTIAL/PLATELET
BASOS ABS: 58 {cells}/uL (ref 0–200)
BASOS PCT: 0.8 %
EOS ABS: 161 {cells}/uL (ref 15–500)
Eosinophils Relative: 2.2 %
HCT: 39 % (ref 35.0–45.0)
HEMOGLOBIN: 13.2 g/dL (ref 11.7–15.5)
Lymphs Abs: 2102 cells/uL (ref 850–3900)
MCH: 27.7 pg (ref 27.0–33.0)
MCHC: 33.8 g/dL (ref 32.0–36.0)
MCV: 81.9 fL (ref 80.0–100.0)
MONOS PCT: 8 %
MPV: 10 fL (ref 7.5–12.5)
Neutro Abs: 4395 cells/uL (ref 1500–7800)
Neutrophils Relative %: 60.2 %
PLATELETS: 282 10*3/uL (ref 140–400)
RBC: 4.76 10*6/uL (ref 3.80–5.10)
RDW: 13 % (ref 11.0–15.0)
TOTAL LYMPHOCYTE: 28.8 %
WBC: 7.3 10*3/uL (ref 3.8–10.8)
WBCMIX: 584 {cells}/uL (ref 200–950)

## 2017-05-23 LAB — HEPATIC FUNCTION PANEL
AG Ratio: 1.5 (calc) (ref 1.0–2.5)
ALT: 24 U/L (ref 6–29)
AST: 23 U/L (ref 10–35)
Albumin: 4 g/dL (ref 3.6–5.1)
Alkaline phosphatase (APISO): 70 U/L (ref 33–130)
Bilirubin, Direct: 0.1 mg/dL (ref 0.0–0.2)
Globulin: 2.6 g/dL (ref 1.9–3.7)
Indirect Bilirubin: 0.3 mg/dL (ref 0.2–1.2)
Total Bilirubin: 0.4 mg/dL (ref 0.2–1.2)
Total Protein: 6.6 g/dL (ref 6.1–8.1)

## 2017-05-23 LAB — URINALYSIS, ROUTINE W REFLEX MICROSCOPIC
Bilirubin Urine: NEGATIVE
Glucose, UA: NEGATIVE
HGB URINE DIPSTICK: NEGATIVE
Ketones, ur: NEGATIVE
LEUKOCYTES UA: NEGATIVE
NITRITE: NEGATIVE
PH: 7 (ref 5.0–8.0)
PROTEIN: NEGATIVE
Specific Gravity, Urine: 1.014 (ref 1.001–1.03)

## 2017-05-23 LAB — BASIC METABOLIC PANEL WITHOUT GFR
BUN: 15 mg/dL (ref 7–25)
CO2: 27 mmol/L (ref 20–32)
Calcium: 9.3 mg/dL (ref 8.6–10.4)
Chloride: 107 mmol/L (ref 98–110)
Creat: 0.71 mg/dL (ref 0.60–0.93)
GFR, Est African American: 97 mL/min/1.73m2
GFR, Est Non African American: 83 mL/min/1.73m2
Glucose, Bld: 117 mg/dL — ABNORMAL HIGH (ref 65–99)
Potassium: 4.2 mmol/L (ref 3.5–5.3)
Sodium: 140 mmol/L (ref 135–146)

## 2017-05-23 LAB — LIPID PANEL
CHOL/HDL RATIO: 3.2 (calc) (ref <5.0)
Cholesterol: 150 mg/dL (ref <200)
HDL: 47 mg/dL — ABNORMAL LOW (ref >50)
LDL CHOLESTEROL (CALC): 81 mg/dL
NON-HDL CHOLESTEROL (CALC): 103 mg/dL (ref <130)
Triglycerides: 119 mg/dL (ref <150)

## 2017-05-23 LAB — TSH: TSH: 3.45 m[IU]/L (ref 0.40–4.50)

## 2017-05-23 LAB — MICROALBUMIN / CREATININE URINE RATIO
Creatinine, Urine: 62 mg/dL (ref 20–275)
Microalb Creat Ratio: 8 ug/mg{creat}
Microalb, Ur: 0.5 mg/dL

## 2017-05-23 LAB — HEMOGLOBIN A1C
EAG (MMOL/L): 6.3 (calc)
Hgb A1c MFr Bld: 5.6 % of total Hgb (ref <5.7)
Mean Plasma Glucose: 114 (calc)

## 2017-05-23 LAB — MAGNESIUM: Magnesium: 1.7 mg/dL (ref 1.5–2.5)

## 2017-05-23 LAB — INSULIN, RANDOM: Insulin: 37.6 u[IU]/mL — ABNORMAL HIGH (ref 2.0–19.6)

## 2017-05-23 LAB — VITAMIN D 25 HYDROXY (VIT D DEFICIENCY, FRACTURES): Vit D, 25-Hydroxy: 51 ng/mL (ref 30–100)

## 2017-06-05 ENCOUNTER — Encounter: Payer: Self-pay | Admitting: Internal Medicine

## 2017-07-01 NOTE — Progress Notes (Signed)
MEDICARE ANNUAL WELLNESS VISIT AND FOLLOW UP  Assessment:   Diagnoses and all orders for this visit:  Encounter for Medicare annual wellness exam  Essential hypertension Labile; currently managed without medications - will continue to monitor and initiate treatment for persistently elevated BPs as indicated Monitor blood pressure at home; call if consistently over 130/80 Continue DASH diet.   Reminder to go to the ER if any CP, SOB, nausea, dizziness, severe HA, changes vision/speech, left arm numbness and tingling and jaw pain.  Hypothyroidism, unspecified type continue medications the same reminded to take on an empty stomach 30-100mins before food.  -     TSH  Mixed hyperlipidemia At goal; continue medications krill oil supplement Continue low cholesterol diet and exercise.  Defer lipid panel  Vitamin D deficiency Continue supplementation Defer checking vitamin D level  Other abnormal glucose Most recent A1C normalized Discussed disease and risks Discussed diet/exercise, weight management  -     BASIC METABOLIC PANEL WITH GFR  Medication management -     CBC with Differential/Platelet -     BASIC METABOLIC PANEL WITH GFR -     Hepatic function panel  BMI 29.0-29.9,adult Long discussion about weight loss, diet, and exercise Recommended diet heavy in fruits and veggies and low in animal meats, cheeses, and dairy products, appropriate calorie intake Follow up in 6 months  Screening colon cancer Low risk; requests cologuard after discussion today of risks/benefits  Over 40 minutes of exam, counseling, chart review and critical decision making was performed Future Appointments  Date Time Provider [place]  07/05/2017 10:00 AM [facility]  11/20/2017 11:00 AM Liane Comber, NP [facility] None     Plan:   During the course of the visit the patient was educated and counseled about appropriate screening and preventive services  including:    Pneumococcal vaccine   Prevnar 13  Influenza vaccine  Td vaccine  Screening electrocardiogram  Bone densitometry screening  Colorectal cancer screening  Diabetes screening  Glaucoma screening  Nutrition counseling   Advanced directives: requested   Subjective:  [place] is a remarkably healthy 76 y.o. female who presents for Medicare Annual Wellness Visit and 3 month follow up.   BMI is Body mass index is 28.21 kg/m., she has been working on diet and exercise. Wt Readings from Last 3 Encounters:  07/03/17 153 lb (69.4 kg)  05/22/17 152 lb 12.8 oz (69.3 kg)  11/13/16 156 lb (70.8 kg)    Her blood pressure has been controlled at home, today their BP is BP: 136/72 She does not workout. She denies chest pain, shortness of breath, dizziness.   She is not on cholesterol medication and denies myalgias. Her cholesterol is at goal. The cholesterol last visit was:   Lab Results  Component Value Date   CHOL 150 05/22/2017   HDL 47 (L) 05/22/2017   LDLCALC 88 11/13/2016   TRIG 119 05/22/2017   CHOLHDL 3.2 05/22/2017    She has been working on diet and exercise for prediabetes, and denies increased appetite, nausea, paresthesia of the feet, polydipsia, polyuria, visual disturbances and vomiting. Last A1C in the office was back within normal range:  Lab Results  Component Value Date   HGBA1C 5.6 05/22/2017   Last GFR: Lab Results  Component Value Date   GFRNONAA 83 05/22/2017   She is on thyroid medication. Her medication was not changed last visit.   Lab Results  Component Value Date   TSH 3.45  05/22/2017   Patient is on Vitamin D supplement but remained below goal at the last visit:    Lab Results  Component Value Date   VD25OH 51 05/22/2017      Medication Review: Current Outpatient Medications on File Prior to Visit  Medication Sig Dispense Refill  . aspirin 81 MG chewable tablet Chew by mouth daily.    . Cholecalciferol  (VITAMIN D PO) Take 10,000 Int'l Units by mouth daily.    . fluocinonide cream (LIDEX) 0.05 %     . Krill Oil 300 MG CAPS Take daily by mouth. Two tablets daily. Takes the [employer] brand.    Marland Kitchen levothyroxine (SYNTHROID, LEVOTHROID) 50 MCG tablet TAKE 1/2 TO 1 TABLET BY MOUTH EVERY DAY AS DIRECTED 90 tablet 1  . OVER THE COUNTER MEDICATION daily. [facility] brand allergy pill     No current facility-administered medications on file prior to visit.     No Known Allergies  Current Problems (verified) Patient Active Problem List   Diagnosis Date Noted  . BMI 29.0-29.9,adult 04/18/2015  . Medication management 06/22/2013  . Hypertension   . Hyperlipidemia   . Vitamin D deficiency   . Hypothyroidism   . Other abnormal glucose     Screening Tests Immunization History  Administered Date(s) Administered  . DT 04/08/2014  . Influenza, High Dose Seasonal PF 04/18/2015, 05/15/2016, 05/22/2017  . Pneumococcal Conjugate-13 05/15/2016  . Pneumococcal Polysaccharide-23 11/18/2006, 04/08/2014  . Td 07/16/2002  . Zoster 01/29/2007   Preventative care: Last colonoscopy: 12/2006 DUE she would like to do cologuard - ordered Last mammogram: 06/2016 cat C has scheduled this Friday 07/05/2017 Last pap smear/pelvic exam: 2011, will not have another DEXA:2011   Prior vaccinations: TD or Tdap: 2015  Influenza: 2017 Pneumococcal: 2015 Prevnar13: 2017 Shingles/Zostavax: 2008  Names of Other Physician/Practitioners you currently use: 1. [facility] here for primary care 2. [facility], Dr. Valetta Close, eye doctor, last visit 04/2017 3. Dr. Donn Pierini, dentist, last visit  06/2017  Patient Care Team: Unk Pinto, MD as PCP - General (Internal Medicine) Inda Castle, MD (Inactive) as Consulting Physician (Gastroenterology) Marygrace Drought, MD as Consulting Physician (Ophthalmology) Rolm Bookbinder, MD as Consulting Physician (Dermatology)  SURGICAL  HISTORY She  has a past surgical history that includes Appendectomy; Tonsillectomy; and Other surgical history (1981). FAMILY HISTORY Her family history includes Asthma in her mother; COPD in her father; Cancer in her brother; Gout in her father; Heart disease in her father and mother. SOCIAL HISTORY She  reports that  has never smoked. she has never used smokeless tobacco. She reports that she drinks alcohol. She reports that she does not use drugs.   MEDICARE WELLNESS OBJECTIVES: Physical activity: Current Exercise Habits: The patient does not participate in regular exercise at present, Exercise limited by: None identified Cardiac risk factors: Cardiac Risk Factors include: advanced age (>63men, >28 women);sedentary lifestyle Depression/mood screen:   Depression screen [facility] 2/9 07/03/2017  Decreased Interest 0  Down, Depressed, Hopeless 0  PHQ - 2 Score 0    ADLs:  In your present state of health, do you have any difficulty performing the following activities: 07/03/2017 05/22/2017  Hearing? N N  Vision? N N  Difficulty concentrating or making decisions? N N  Walking or climbing stairs? N N  Dressing or bathing? N N  Doing errands, shopping? N N  Some recent data might be hidden     Cognitive Testing  Alert? Yes  Normal Appearance?Yes  Oriented  to person? Yes  Place? Yes   Time? Yes  Recall of three objects?  Yes  Can perform simple calculations? Yes  Displays appropriate judgment?Yes  Can read the correct time from a watch face?Yes  EOL planning: Does Patient Have a Medical Advance Directive?: Yes Type of Advance Directive: Healthcare Power of Attorney, Living will Does patient want to make changes to medical advance directive?: No - Patient declined Copy of Reader in Chart?: No - copy requested  Review of Systems  Constitutional: Negative for malaise/fatigue and weight loss.  HENT: Negative for hearing loss and tinnitus.   Eyes: Negative for  blurred vision and double vision.  Respiratory: Negative for cough, shortness of breath and wheezing.   Cardiovascular: Negative for chest pain, palpitations, orthopnea, claudication and leg swelling.  Gastrointestinal: Negative for abdominal pain, blood in stool, constipation, diarrhea, heartburn, melena, nausea and vomiting.  Genitourinary: Negative.   Musculoskeletal: Negative for joint pain and myalgias.  Skin: Negative for rash.  Neurological: Negative for dizziness, tingling, sensory change, weakness and headaches.  Endo/Heme/Allergies: Negative for environmental allergies and polydipsia.  Psychiatric/Behavioral: Negative.  Negative for depression. The patient is not nervous/anxious and does not have insomnia.   All other systems reviewed and are negative.    Objective:     Today's Vitals   07/03/17 1609  BP: 136/72  Pulse: 64  Temp: (!) 97.3 F (36.3 C)  SpO2: 98%  Weight: 153 lb (69.4 kg)  Height: 5' 1.75" (1.568 m)   Body mass index is 28.21 kg/m.  General appearance: alert, no distress, WD/WN, female HEENT: normocephalic, sclerae anicteric, TMs pearly, nares patent, no discharge or erythema, pharynx normal Oral cavity: MMM, no lesions Neck: supple, no lymphadenopathy, no thyromegaly, no masses Heart: RRR, normal S1, S2, no murmurs Lungs: CTA bilaterally, no wheezes, rhonchi, or rales Abdomen: +bs, soft, non tender, non distended, no masses, no hepatomegaly, no splenomegaly Musculoskeletal: nontender, no swelling, no obvious deformity Extremities: no edema, no cyanosis, no clubbing Pulses: 2+ symmetric, upper and lower extremities, normal cap refill Neurological: alert, oriented x 3, CN2-12 intact, strength normal upper extremities and lower extremities, sensation normal throughout, DTRs 2+ throughout, no cerebellar signs, gait normal Psychiatric: normal affect, behavior normal, pleasant   Medicare Attestation I have personally reviewed: The patient's medical and  social history Their use of alcohol, tobacco or illicit drugs Their current medications and supplements The patient's functional ability including ADLs,fall risks, home safety risks, cognitive, and hearing and visual impairment Diet and physical activities Evidence for depression or mood disorders  The patient's weight, height, BMI, and visual acuity have been recorded in the chart.  I have made referrals, counseling, and provided education to the patient based on review of the above and I have provided the patient with a written personalized care plan for preventive services.     Izora Ribas, NP   07/03/2017

## 2017-07-03 ENCOUNTER — Encounter: Payer: Self-pay | Admitting: Adult Health

## 2017-07-03 ENCOUNTER — Ambulatory Visit (INDEPENDENT_AMBULATORY_CARE_PROVIDER_SITE_OTHER): Payer: Medicare Other | Admitting: Adult Health

## 2017-07-03 VITALS — BP 136/72 | HR 64 | Temp 97.3°F | Ht 61.75 in | Wt 153.0 lb

## 2017-07-03 DIAGNOSIS — E559 Vitamin D deficiency, unspecified: Secondary | ICD-10-CM

## 2017-07-03 DIAGNOSIS — I10 Essential (primary) hypertension: Secondary | ICD-10-CM

## 2017-07-03 DIAGNOSIS — R7309 Other abnormal glucose: Secondary | ICD-10-CM | POA: Diagnosis not present

## 2017-07-03 DIAGNOSIS — Z1212 Encounter for screening for malignant neoplasm of rectum: Secondary | ICD-10-CM

## 2017-07-03 DIAGNOSIS — E782 Mixed hyperlipidemia: Secondary | ICD-10-CM

## 2017-07-03 DIAGNOSIS — Z0001 Encounter for general adult medical examination with abnormal findings: Secondary | ICD-10-CM | POA: Diagnosis not present

## 2017-07-03 DIAGNOSIS — E039 Hypothyroidism, unspecified: Secondary | ICD-10-CM | POA: Diagnosis not present

## 2017-07-03 DIAGNOSIS — Z0000 Encounter for general adult medical examination without abnormal findings: Secondary | ICD-10-CM

## 2017-07-03 DIAGNOSIS — R6889 Other general symptoms and signs: Secondary | ICD-10-CM | POA: Diagnosis not present

## 2017-07-03 DIAGNOSIS — Z79899 Other long term (current) drug therapy: Secondary | ICD-10-CM

## 2017-07-03 DIAGNOSIS — Z1211 Encounter for screening for malignant neoplasm of colon: Secondary | ICD-10-CM

## 2017-07-03 DIAGNOSIS — Z6829 Body mass index (BMI) 29.0-29.9, adult: Secondary | ICD-10-CM

## 2017-07-03 NOTE — Patient Instructions (Signed)

## 2017-07-04 LAB — BASIC METABOLIC PANEL WITH GFR
BUN: 17 mg/dL (ref 7–25)
CHLORIDE: 106 mmol/L (ref 98–110)
CO2: 30 mmol/L (ref 20–32)
CREATININE: 0.71 mg/dL (ref 0.60–0.93)
Calcium: 9.9 mg/dL (ref 8.6–10.4)
GFR, EST AFRICAN AMERICAN: 97 mL/min/{1.73_m2} (ref >=60)
GFR, EST NON AFRICAN AMERICAN: 83 mL/min/{1.73_m2} (ref >=60)
Glucose, Bld: 96 mg/dL (ref 65–99)
Potassium: 4.6 mmol/L (ref 3.5–5.3)
Sodium: 142 mmol/L (ref 135–146)

## 2017-07-04 LAB — CBC WITH DIFFERENTIAL/PLATELET
Basophils Absolute: 104 cells/uL (ref 0–200)
Basophils Relative: 1 %
EOS PCT: 1.9 %
Eosinophils Absolute: 198 cells/uL (ref 15–500)
HCT: 40.3 % (ref 35.0–45.0)
Hemoglobin: 13.6 g/dL (ref 11.7–15.5)
Lymphs Abs: 3214 cells/uL (ref 850–3900)
MCH: 27.3 pg (ref 27.0–33.0)
MCHC: 33.7 g/dL (ref 32.0–36.0)
MCV: 80.9 fL (ref 80.0–100.0)
MONOS PCT: 7.8 %
MPV: 10.1 fL (ref 7.5–12.5)
Neutro Abs: 6074 cells/uL (ref 1500–7800)
Neutrophils Relative %: 58.4 %
PLATELETS: 278 10*3/uL (ref 140–400)
RBC: 4.98 10*6/uL (ref 3.80–5.10)
RDW: 13 % (ref 11.0–15.0)
TOTAL LYMPHOCYTE: 30.9 %
WBC mixed population: 811 cells/uL (ref 200–950)
WBC: 10.4 10*3/uL (ref 3.8–10.8)

## 2017-07-04 LAB — HEPATIC FUNCTION PANEL
AG RATIO: 1.7 (calc) (ref 1.0–2.5)
ALBUMIN MSPROF: 4.2 g/dL (ref 3.6–5.1)
ALT: 16 U/L (ref 6–29)
AST: 18 U/L (ref 10–35)
Alkaline phosphatase (APISO): 79 U/L (ref 33–130)
BILIRUBIN DIRECT: 0.1 mg/dL (ref 0.0–0.2)
BILIRUBIN INDIRECT: 0.2 mg/dL (ref 0.2–1.2)
BILIRUBIN TOTAL: 0.3 mg/dL (ref 0.2–1.2)
GLOBULIN: 2.5 g/dL (ref 1.9–3.7)
Total Protein: 6.7 g/dL (ref 6.1–8.1)

## 2017-07-04 LAB — TSH: TSH: 2.99 m[IU]/L (ref 0.40–4.50)

## 2017-07-05 ENCOUNTER — Ambulatory Visit
Admission: RE | Admit: 2017-07-05 | Discharge: 2017-07-05 | Disposition: A | Discharge status: Home / Self Care | Payer: Medicare Other | Source: Ambulatory Visit | Attending: Internal Medicine | Admitting: Internal Medicine

## 2017-07-05 DIAGNOSIS — Z1231 Encounter for screening mammogram for malignant neoplasm of breast: Secondary | ICD-10-CM | POA: Diagnosis not present

## 2017-07-18 NOTE — Progress Notes (Signed)
Cologuard orders needed to be redone because [name] isn't an authorized provider yet. Had to redo with Dr. Melford Aase being the ordering physician. Faxed paperwork today.

## 2017-07-24 DIAGNOSIS — Z1211 Encounter for screening for malignant neoplasm of colon: Secondary | ICD-10-CM | POA: Diagnosis not present

## 2017-07-24 DIAGNOSIS — Z1212 Encounter for screening for malignant neoplasm of rectum: Secondary | ICD-10-CM | POA: Diagnosis not present

## 2017-08-03 LAB — COLOGUARD: Cologuard: NEGATIVE

## 2017-08-06 ENCOUNTER — Encounter: Payer: Self-pay | Admitting: Adult Health

## 2017-08-06 ENCOUNTER — Telehealth: Payer: Self-pay

## 2017-08-06 NOTE — Telephone Encounter (Signed)
Patient inquiring about results for Cologuard. Results were Negative and given to the patient.

## 2017-08-23 ENCOUNTER — Telehealth: Payer: Self-pay | Admitting: *Deleted

## 2017-08-23 ENCOUNTER — Other Ambulatory Visit: Payer: Self-pay | Admitting: Internal Medicine

## 2017-08-23 MED ORDER — AZITHROMYCIN 250 MG PO TABS: ORAL_TABLET | ORAL | 1 refills | Status: DC

## 2017-08-23 MED ORDER — PROMETHAZINE-DM 6.25-15 MG/5ML PO SYRP: ORAL_SOLUTION | ORAL | 1 refills | Status: DC

## 2017-08-23 MED ORDER — PREDNISONE 20 MG PO TABS: ORAL_TABLET | ORAL | 0 refills | Status: DC

## 2017-08-23 NOTE — Telephone Encounter (Signed)
Patient called and complained of cough sinus congestio and fever.  Per Dr Melford Aase, RX's for Z-pak, Prednisone and cough medication sent to her pharmacy.  The patient is aware.

## 2017-08-23 NOTE — Telephone Encounter (Signed)
error 

## 2017-09-03 ENCOUNTER — Ambulatory Visit (INDEPENDENT_AMBULATORY_CARE_PROVIDER_SITE_OTHER): Payer: Medicare Other | Admitting: Physician Assistant

## 2017-09-03 ENCOUNTER — Encounter: Payer: Self-pay | Admitting: Physician Assistant

## 2017-09-03 VITALS — BP 118/76 | HR 77 | Temp 97.5°F | Resp 16 | Ht 61.75 in | Wt 153.2 lb

## 2017-09-03 DIAGNOSIS — R05 Cough: Secondary | ICD-10-CM

## 2017-09-03 DIAGNOSIS — R058 Other specified cough: Secondary | ICD-10-CM

## 2017-09-03 NOTE — Patient Instructions (Signed)
  Here are things you can do to help with this: - Try the Flonase or Nasonex. Remember to spray each nostril twice towards the outer part of your eye.  Do not sniff but instead pinch your nose and tilt your head back to help the medicine get into your sinuses.  The best time to do this is at bedtime.Stop if you get blurred vision or nose bleeds.   -Please pick one of the over the counter allergy medications below and take it once daily for allergies.  It will also help with fluid behind ear drums. Claritin or loratadine cheapest but likely the weakest  Zyrtec or certizine at night because it can make you sleepy The strongest is allegra or fexafinadine  Cheapest at [facility], [facility], [facility] -can use decongestant over the counter, please do not use if you have high blood pressure or certain heart conditions.   if worsening HA, changes vision/speech, imbalance, weakness go to the ER   HOW TO TREAT VIRAL COUGH AND COLD SYMPTOMS:  -Symptoms usually last at least 1 week with the worst symptoms being around day 4.  - colds usually start with a sore throat and end with a cough, and the cough can take 2 weeks to get better.  -No antibiotics are needed for colds, flu, sore throats, cough, bronchitis UNLESS symptoms are longer than 7 days OR if you are getting better then get drastically worse.  -There are a lot of combination medications (Dayquil, Nyquil, Vicks 44, tyelnol cold and sinus, ETC). Please look at the ingredients on the back so that you are treating the correct symptoms and not doubling up on medications/ingredients.    Medicines you can use  Nasal congestion  Little Remedies saline spray (aerosol/mist)- can try this, it is in the kids section - pseudoephedrine (Sudafed)- behind the counter, do not use if you have high blood pressure, medicine that have -D in them.  - phenylephrine (Sudafed PE) -Dextormethorphan + chlorpheniramine (Coridcidin HBP)- okay if you have high blood  pressure -Oxymetazoline (Afrin) nasal spray- LIMIT to 3 days -Saline nasal spray -Neti pot (used distilled or bottled water)  Ear pain/congestion  -pseudoephedrine (sudafed) - Nasonex/flonase nasal spray  Fever  -Acetaminophen (Tyelnol) -Ibuprofen (Advil, motrin, aleve)  Sore Throat  -Acetaminophen (Tyelnol) -Ibuprofen (Advil, motrin, aleve) -Drink a lot of water -Gargle with salt water - Rest your voice (don't talk) -Throat sprays -Cough drops  Body Aches  -Acetaminophen (Tyelnol) -Ibuprofen (Advil, motrin, aleve)  Headache  -Acetaminophen (Tyelnol) -Ibuprofen (Advil, motrin, aleve) - Exedrin, Exedrin Migraine  Allergy symptoms (cough, sneeze, runny nose, itchy eyes) -Claritin or loratadine cheapest but likely the weakest  -Zyrtec or certizine at night because it can make you sleepy -The strongest is allegra or fexafinadine  Cheapest at [facility], [employer], [employer]  Cough  -Dextromethorphan (Delsym)- medicine that has DM in it -Guafenesin (Mucinex/Robitussin) - cough drops - drink lots of water  Chest Congestion  -Guafenesin (Mucinex/Robitussin)  Red Itchy Eyes  - Naphcon-A  Upset Stomach  - Bland diet (nothing spicy, greasy, fried, and high acid foods like tomatoes, oranges, berries) -OKAY- cereal, bread, soup, crackers, rice -Eat smaller more frequent meals -reduce caffeine, no alcohol -Loperamide (Imodium-AD) if diarrhea -Prevacid for heart burn  General health when sick  -Hydration -wash your hands frequently -keep surfaces clean -change pillow cases and sheets often -Get fresh air but do not exercise strenuously -Vitamin D, double up on it - Vitamin C -Zinc

## 2017-09-03 NOTE — Progress Notes (Signed)
77 y.o. female with history of HTN, hypothyroid, chol presents with 14 days of URI symptoms. She was given zpak, prednisone and cough syrup 08/23/2017. She had congestion, SOB, wheezing. She has taken all of her medications, she is suppose to go to [facility] for 1-2 weeks.  Symptoms at this time include cough (improved from before), still white/clear mucus, worse when she lays down. No recent fever, chills. Some mild sinus congestion, no pressure/teeth pain.  Currently she is not on anything.  She is not a smoker.   Pulse 77, temperature (!) 97.5 F (36.4 C), resp. rate 16, height 5' 1.75" (1.568 m), weight 153 lb 3.2 oz (69.5 kg), SpO2 97 %.  Problem list has Hypertension; Hyperlipidemia; Vitamin D deficiency; Hypothyroidism; Other abnormal glucose; Medication management; and BMI 29.0-29.9,adult on their problem list.  Medications Current Outpatient Medications on File Prior to Visit  Medication Sig  . aspirin 81 MG chewable tablet Chew by mouth daily.  . Cholecalciferol (VITAMIN D PO) Take 10,000 Int'l Units by mouth daily.  . fluocinonide cream (LIDEX) 0.05 %   . Krill Oil 300 MG CAPS Take daily by mouth. Two tablets daily. Takes the [employer] brand.  Marland Kitchen levothyroxine (SYNTHROID, LEVOTHROID) 50 MCG tablet TAKE 1/2 TO 1 TABLET BY MOUTH EVERY DAY AS DIRECTED  . OVER THE COUNTER MEDICATION daily. [facility] brand allergy pill  . promethazine-dextromethorphan (PROMETHAZINE-DM) 6.25-15 MG/5ML syrup Take 1 to 2 tsp enery 4 hours if needed for cough   No current facility-administered medications on file prior to visit.     Allergies: No Known Allergies  ROS: See HPI  Physical Exam Physical Exam  Constitutional: She is oriented to person, place, and time. She appears well-developed and well-nourished.  HENT:  Head: Normocephalic and atraumatic.  Right Ear: External ear normal.  Left Ear: External ear normal.  Mouth/Throat: Oropharynx is clear and moist.  Eyes: Conjunctivae and EOM are normal.  Pupils are equal, round, and reactive to light.  Neck: Normal range of motion. Neck supple. No thyromegaly present.  Cardiovascular: Normal rate, regular rhythm and normal heart sounds. Exam reveals no gallop and no friction rub.  No murmur heard. Pulmonary/Chest: Effort normal and breath sounds normal. No respiratory distress. She has no wheezes.  Abdominal: Soft. Bowel sounds are normal. She exhibits no distension and no mass. There is no tenderness. There is no rebound and no guarding.  Musculoskeletal: Normal range of motion.  Lymphadenopathy:    She has no cervical adenopathy.  Neurological: She is alert and oriented to person, place, and time. She displays normal reflexes. No cranial nerve deficit. Coordination normal.  Skin: Skin is warm and dry.  Psychiatric: She has a normal mood and affect.    Assessment and Plan:  Likely post viral cough, has completed ABX, lungs CTAB, doing better Will get on allergy pill, nasal spray, continue cough drops and syrup  If you develop worsening sinus pain, fever or notice severe headache and vision changes, or if symptoms are not better after completion of antibiotic, please schedule an appointment with a health care provider. If you are getting worse please go to the ER.    Visiting [place]  Future Appointments  Date Time Provider [place]  11/20/2017 11:00 AM Liane Comber, NP [facility] None

## 2017-09-23 DIAGNOSIS — M79645 Pain in left finger(s): Secondary | ICD-10-CM | POA: Diagnosis not present

## 2017-09-23 DIAGNOSIS — M13842 Other specified arthritis, left hand: Secondary | ICD-10-CM | POA: Diagnosis not present

## 2017-10-11 ENCOUNTER — Other Ambulatory Visit: Payer: Self-pay | Admitting: Physician Assistant

## 2017-10-11 DIAGNOSIS — L821 Other seborrheic keratosis: Secondary | ICD-10-CM | POA: Diagnosis not present

## 2017-10-11 DIAGNOSIS — L570 Actinic keratosis: Secondary | ICD-10-CM | POA: Diagnosis not present

## 2017-10-11 DIAGNOSIS — L820 Inflamed seborrheic keratosis: Secondary | ICD-10-CM | POA: Diagnosis not present

## 2017-10-11 DIAGNOSIS — Z85828 Personal history of other malignant neoplasm of skin: Secondary | ICD-10-CM | POA: Diagnosis not present

## 2017-10-11 MED ORDER — OSELTAMIVIR PHOSPHATE 75 MG PO CAPS: 75.0000 mg | ORAL_CAPSULE | Freq: Every day | ORAL | 0 refills | Status: AC

## 2017-11-19 NOTE — Progress Notes (Signed)
FOLLOW UP  Assessment and Plan:   Hypertension Fairly controlled by lifestyle Monitor blood pressure at home; patient to call if consistently greater than 130/80 Continue DASH diet.   Reminder to go to the ER if any CP, SOB, nausea, dizziness, severe HA, changes vision/speech, left arm numbness and tingling and jaw pain.  Cholesterol Currently at goal by lifestyle modification Continue low cholesterol diet and exercise.  Check lipid panel.   Other abnormal glucose Recent A1Cs at goal Discussed diet/exercise, weight management  Check 6 month A1C  BMI 29 Continue to recommend diet heavy in fruits and veggies and low in animal meats, cheeses, and dairy products, appropriate calorie intake Diet discussed at length 10 lb weight loss goal given - will follow up in 6 months Discuss exercise recommendations - she is willing to start walking 30 min 5 days a week Continue to monitor weight at each visit  Hypothyroidism continue medications the same pending lab results reminded to take on an empty stomach 30-73mins before food.  check TSH level  Vitamin D Def Continue supplementation; near goal at last check Check Vit D level  Continue diet and meds as discussed. Further disposition pending results of labs. Discussed med's effects and SE's.   Over 30 minutes of exam, counseling, chart review, and critical decision making was performed.   Future Appointments  Date Time Provider [place]  05/22/2018  9:00 AM Unk Pinto, MD [facility] None    ----------------------------------------------------------------------------------------------------------------------  HPI 77 y.o. female  presents for 3 month follow up on hypertension, cholesterol, glucose management, weight and vitamin D deficiency.   BMI is Body mass index is 29.13 kg/m., she has not been working on diet and exercise, but plans to restart exercise.  Wt Readings from Last 3 Encounters:  11/20/17 158 lb  (71.7 kg)  09/03/17 153 lb 3.2 oz (69.5 kg)  07/03/17 153 lb (69.4 kg)   Today their BP is BP: 130/78  She does not workout. She denies chest pain, shortness of breath, dizziness.   She is not on cholesterol medication and denies myalgias. Her cholesterol is at goal. The cholesterol last visit was:   Lab Results  Component Value Date   CHOL 150 05/22/2017   HDL 47 (L) 05/22/2017   LDLCALC 81 05/22/2017   TRIG 119 05/22/2017   CHOLHDL 3.2 05/22/2017    She has not been working on diet and exercise for glucose mangement (hx of prediabetes), and denies foot ulcerations, increased appetite, nausea, paresthesia of the feet, polydipsia, polyuria, visual disturbances, vomiting and weight loss. Last A1C in the office was:  Lab Results  Component Value Date   HGBA1C 5.6 05/22/2017   She is on thyroid medication. Her medication was not changed last visit.   Lab Results  Component Value Date   TSH 2.99 07/03/2017  .  Patient is on Vitamin D supplement.   Lab Results  Component Value Date   VD25OH 51 05/22/2017        Current Medications:  Current Outpatient Medications on File Prior to Visit  Medication Sig  . aspirin 81 MG chewable tablet Chew by mouth daily.  . Cholecalciferol (VITAMIN D PO) Take 10,000 Int'l Units by mouth daily.  . fluocinonide cream (LIDEX) 0.05 % as needed.   Javier Docker Oil 1000 MG CAPS Take by mouth daily.   Marland Kitchen levothyroxine (SYNTHROID, LEVOTHROID) 50 MCG tablet TAKE 1/2 TO 1 TABLET BY MOUTH EVERY DAY AS DIRECTED  . OVER THE COUNTER MEDICATION daily. [facility] brand  allergy pill  . promethazine-dextromethorphan (PROMETHAZINE-DM) 6.25-15 MG/5ML syrup Take 1 to 2 tsp enery 4 hours if needed for cough (Patient not taking: Reported on 11/20/2017)   No current facility-administered medications on file prior to visit.      Allergies: No Known Allergies   Medical History:  Past Medical History:  Diagnosis Date  . Arthritis   . Hyperlipidemia   . Hypertension   .  Prediabetes   . Thyroid disease   . Vitamin D deficiency    Family history- Reviewed and unchanged Social history- Reviewed and unchanged   Review of Systems:  Review of Systems  Constitutional: Negative for malaise/fatigue and weight loss.  HENT: Negative for hearing loss and tinnitus.   Eyes: Negative for blurred vision and double vision.  Respiratory: Negative for cough, shortness of breath and wheezing.   Cardiovascular: Negative for chest pain, palpitations, orthopnea, claudication and leg swelling.  Gastrointestinal: Negative for abdominal pain, blood in stool, constipation, diarrhea, heartburn, melena, nausea and vomiting.  Genitourinary: Negative.   Musculoskeletal: Negative for joint pain and myalgias.  Skin: Negative for rash.  Neurological: Negative for dizziness, tingling, sensory change, weakness and headaches.  Endo/Heme/Allergies: Negative for polydipsia.  Psychiatric/Behavioral: Negative.   All other systems reviewed and are negative.     Physical Exam: BP 130/78   Pulse 66   Temp (!) 97.3 F (36.3 C)   Ht 5' 1.75" (1.568 m)   Wt 158 lb (71.7 kg)   SpO2 97%   BMI 29.13 kg/m  Wt Readings from Last 3 Encounters:  11/20/17 158 lb (71.7 kg)  09/03/17 153 lb 3.2 oz (69.5 kg)  07/03/17 153 lb (69.4 kg)   General Appearance: Well nourished, in no apparent distress. Eyes: PERRLA, EOMs, conjunctiva no swelling or erythema Sinuses: No Frontal/maxillary tenderness ENT/Mouth: Ext aud canals clear, TMs without erythema, bulging. No erythema, swelling, or exudate on post pharynx.  Tonsils not swollen or erythematous. Hearing normal.  Neck: Supple, thyroid normal.  Respiratory: Respiratory effort normal, BS equal bilaterally without rales, rhonchi, wheezing or stridor.  Cardio: RRR with no MRGs. Brisk peripheral pulses without edema.  Abdomen: Soft, + BS.  Non tender, no guarding, rebound, hernias, masses. Lymphatics: Non tender without lymphadenopathy.   Musculoskeletal: Full ROM, 5/5 strength, Normal gait Skin: Warm, dry without rashes, lesions, ecchymosis.  Neuro: Cranial nerves intact. No cerebellar symptoms.  Psych: Awake and oriented X 3, normal affect, Insight and Judgment appropriate.    Izora Ribas, NP 11:35 AM [facility]

## 2017-11-20 ENCOUNTER — Encounter: Payer: Self-pay | Admitting: Adult Health

## 2017-11-20 ENCOUNTER — Ambulatory Visit (INDEPENDENT_AMBULATORY_CARE_PROVIDER_SITE_OTHER): Payer: Medicare Other | Admitting: Adult Health

## 2017-11-20 VITALS — BP 130/78 | HR 66 | Temp 97.3°F | Ht 61.75 in | Wt 158.0 lb

## 2017-11-20 DIAGNOSIS — E782 Mixed hyperlipidemia: Secondary | ICD-10-CM | POA: Diagnosis not present

## 2017-11-20 DIAGNOSIS — I10 Essential (primary) hypertension: Secondary | ICD-10-CM | POA: Diagnosis not present

## 2017-11-20 DIAGNOSIS — Z6829 Body mass index (BMI) 29.0-29.9, adult: Secondary | ICD-10-CM | POA: Diagnosis not present

## 2017-11-20 DIAGNOSIS — E039 Hypothyroidism, unspecified: Secondary | ICD-10-CM

## 2017-11-20 DIAGNOSIS — R7309 Other abnormal glucose: Secondary | ICD-10-CM | POA: Diagnosis not present

## 2017-11-20 DIAGNOSIS — Z79899 Other long term (current) drug therapy: Secondary | ICD-10-CM

## 2017-11-20 DIAGNOSIS — E559 Vitamin D deficiency, unspecified: Secondary | ICD-10-CM | POA: Diagnosis not present

## 2017-11-20 NOTE — Patient Instructions (Addendum)
Aim for a 10 lb weight loss over the next 6 months  Aim for 7+ servings of fruits and vegetables daily  80+ fluid ounces of water or unsweet tea for healthy kidneys  Limit alcohol intake  Limit animal fats in diet for cholesterol and heart health - choose grass fed whenever available  Aim for low stress - take time to unwind and care for your mental health  Aim for 150 min of moderate intensity exercise weekly for heart health, and weights twice weekly for bone health  Aim for 7-9 hours of sleep daily       Preventing Type 2 Diabetes Mellitus Type 2 diabetes (type 2 diabetes mellitus) is a long-term (chronic) disease that affects blood sugar (glucose) levels. Normally, a hormone called insulin allows glucose to enter cells in the body. The cells use glucose for energy. In type 2 diabetes, one or both of these problems may be present:  The body does not make enough insulin.  The body does not respond properly to insulin that it makes (insulin resistance).  Insulin resistance or lack of insulin causes excess glucose to build up in the blood instead of going into cells. As a result, high blood glucose (hyperglycemia) develops, which can cause many complications. Being overweight or obese and having an inactive (sedentary) lifestyle can increase your risk for diabetes. Type 2 diabetes can be delayed or prevented by making certain nutrition and lifestyle changes. What nutrition changes can be made?  Eat healthy meals and snacks regularly. Keep a healthy snack with you for when you get hungry between meals, such as fruit or a handful of nuts.  Eat lean meats and proteins that are low in saturated fats, such as chicken, fish, egg whites, and beans. Avoid processed meats.  Eat plenty of fruits and vegetables and plenty of grains that have not been processed (whole grains). It is recommended that you eat: ? 1?2 cups of fruit every day. ? 2?3 cups of  vegetables every day. ? 6?8 oz of whole grains every day, such as oats, whole wheat, bulgur, brown rice, quinoa, and millet.  Eat low-fat dairy products, such as milk, yogurt, and cheese.  Eat foods that contain healthy fats, such as nuts, avocado, olive oil, and canola oil.  Drink water throughout the day. Avoid drinks that contain added sugar, such as soda or sweet tea.  Follow instructions from your health care provider about specific eating or drinking restrictions.  Control how much food you eat at a time (portion size). ? Check food labels to find out the serving sizes of foods. ? Use a kitchen scale to weigh amounts of foods.  Saute or steam food instead of frying it. Cook with water or broth instead of oils or butter.  Limit your intake of: ? Salt (sodium). Have no more than 1 tsp (2,400 mg) of sodium a day. If you have heart disease or high blood pressure, have less than ? tsp (1,500 mg) of sodium a day. ? Saturated fat. This is fat that is solid at room temperature, such as butter or fat on meat. What lifestyle changes can be made?  Activity  Do moderate-intensity physical activity for at least 30 minutes on at least 5 days of the week, or as much as told by your health care provider.  Ask your health care provider what activities are safe for you. A mix of physical activities may be best, such as walking, swimming, cycling, and strength training.  Try to add physical activity into your day. For example: ? Park in spots that are farther away than usual, so that you walk more. For example, park in a far corner of the parking lot when you go to the office or the grocery store. ? Take a walk during your lunch break. ? Use stairs instead of elevators or escalators. Weight Loss  Lose weight as directed. Your health care provider can determine how much weight loss is best for you and can help you lose weight safely.  If you are overweight or obese, you may be instructed to  lose at least 5?7 % of your body weight. Alcohol and Tobacco   Limit alcohol intake to no more than 1 drink a day for nonpregnant women and 2 drinks a day for men. One drink equals 12 oz of beer, 5 oz of wine, or 1 oz of hard liquor.  Do not use any tobacco products, such as cigarettes, chewing tobacco, and e-cigarettes. If you need help quitting, ask your health care provider. Work With [facility] Provider  Have your blood glucose tested regularly, as told by your health care provider.  Discuss your risk factors and how you can reduce your risk for diabetes.  Get screening tests as told by your health care provider. You may have screening tests regularly, especially if you have certain risk factors for type 2 diabetes.  Make an appointment with a diet and nutrition specialist (registered dietitian). A registered dietitian can help you make a healthy eating plan and can help you understand portion sizes and food labels. Why are these changes important?  It is possible to prevent or delay type 2 diabetes and related health problems by making lifestyle and nutrition changes.  It can be difficult to recognize signs of type 2 diabetes. The best way to avoid possible damage to your body is to take actions to prevent the disease before you develop symptoms. What can happen if changes are not made?  Your blood glucose levels may keep increasing. Having high blood glucose for a long time is dangerous. Too much glucose in your blood can damage your blood vessels, heart, kidneys, nerves, and eyes.  You may develop prediabetes or type 2 diabetes. Type 2 diabetes can lead to many chronic health problems and complications, such as: ? Heart disease. ? Stroke. ? Blindness. ? Kidney disease. ? Depression. ? Poor circulation in the feet and legs, which could lead to surgical removal (amputation) in severe cases. Where to find support:  Ask your health care provider to recommend a registered  dietitian, diabetes educator, or weight loss program.  Look for local or online weight loss groups.  Join a gym, fitness club, or outdoor activity group, such as a walking club. Where to find more information: To learn more about diabetes and diabetes prevention, visit:  American Diabetes Association (ADA): www.diabetes.[employer] of Diabetes and Digestive and Kidney Diseases: FindSpin.nl  To learn more about healthy eating, visit:  The [place] Department of Agriculture Scientist, research (physical sciences)), Choose My Plate: http://wiley-williams.com/  Office of Disease Prevention and Health Promotion (ODPHP), Dietary Guidelines: SurferLive.at  Summary  You can reduce your risk for type 2 diabetes by increasing your physical activity, eating healthy foods, and losing weight as directed.  Talk with your health care provider about your risk  for type 2 diabetes. Ask about any blood tests or screening tests that you need to have. This information is not intended to replace advice given to you by your health care provider. Make sure you discuss any questions you have with your health care provider. Document Released: 10/24/2015 Document Revised: 12/08/2015 Document Reviewed: 08/23/2015 Elsevier Interactive Patient Education  [employer].

## 2017-11-21 ENCOUNTER — Encounter: Payer: Self-pay | Admitting: Adult Health

## 2017-11-21 LAB — CBC WITH DIFFERENTIAL/PLATELET
BASOS ABS: 92 {cells}/uL (ref 0–200)
Basophils Relative: 1.2 %
EOS ABS: 208 {cells}/uL (ref 15–500)
EOS PCT: 2.7 %
HEMATOCRIT: 40.8 % (ref 35.0–45.0)
HEMOGLOBIN: 13.8 g/dL (ref 11.7–15.5)
LYMPHS ABS: 2449 {cells}/uL (ref 850–3900)
MCH: 27.8 pg (ref 27.0–33.0)
MCHC: 33.8 g/dL (ref 32.0–36.0)
MCV: 82.1 fL (ref 80.0–100.0)
MPV: 10.1 fL (ref 7.5–12.5)
Monocytes Relative: 10.2 %
NEUTROS PCT: 54.1 %
Neutro Abs: 4166 cells/uL (ref 1500–7800)
Platelets: 287 10*3/uL (ref 140–400)
RBC: 4.97 10*6/uL (ref 3.80–5.10)
RDW: 13.6 % (ref 11.0–15.0)
Total Lymphocyte: 31.8 %
WBC mixed population: 785 cells/uL (ref 200–950)
WBC: 7.7 10*3/uL (ref 3.8–10.8)

## 2017-11-21 LAB — LIPID PANEL
CHOL/HDL RATIO: 4 (calc) (ref <5.0)
CHOLESTEROL: 175 mg/dL (ref <200)
HDL: 44 mg/dL — AB (ref >50)
LDL Cholesterol (Calc): 106 mg/dL (calc) — ABNORMAL HIGH
Non-HDL Cholesterol (Calc): 131 mg/dL (calc) — ABNORMAL HIGH (ref <130)
Triglycerides: 142 mg/dL (ref <150)

## 2017-11-21 LAB — COMPLETE METABOLIC PANEL WITH GFR
AG RATIO: 1.5 (calc) (ref 1.0–2.5)
ALBUMIN MSPROF: 4.1 g/dL (ref 3.6–5.1)
ALKALINE PHOSPHATASE (APISO): 76 U/L (ref 33–130)
ALT: 19 U/L (ref 6–29)
AST: 19 U/L (ref 10–35)
BILIRUBIN TOTAL: 0.4 mg/dL (ref 0.2–1.2)
BUN: 16 mg/dL (ref 7–25)
CHLORIDE: 106 mmol/L (ref 98–110)
CO2: 28 mmol/L (ref 20–32)
CREATININE: 0.74 mg/dL (ref 0.60–0.93)
Calcium: 9.6 mg/dL (ref 8.6–10.4)
GFR, Est African American: 91 mL/min/{1.73_m2} (ref >=60)
GFR, Est Non African American: 79 mL/min/{1.73_m2} (ref >=60)
GLOBULIN: 2.7 g/dL (ref 1.9–3.7)
Glucose, Bld: 88 mg/dL (ref 65–99)
POTASSIUM: 4.7 mmol/L (ref 3.5–5.3)
SODIUM: 142 mmol/L (ref 135–146)
Total Protein: 6.8 g/dL (ref 6.1–8.1)

## 2017-11-21 LAB — VITAMIN D 25 HYDROXY (VIT D DEFICIENCY, FRACTURES): Vit D, 25-Hydroxy: 74 ng/mL (ref 30–100)

## 2017-11-21 LAB — TSH: TSH: 3.3 mIU/L (ref 0.40–4.50)

## 2017-11-21 LAB — HEMOGLOBIN A1C
EAG (MMOL/L): 6.6 (calc)
HEMOGLOBIN A1C: 5.8 %{Hb} — AB (ref <5.7)
MEAN PLASMA GLUCOSE: 120 (calc)

## 2017-11-27 ENCOUNTER — Other Ambulatory Visit: Payer: Self-pay | Admitting: Internal Medicine

## 2018-04-18 DIAGNOSIS — H2513 Age-related nuclear cataract, bilateral: Secondary | ICD-10-CM | POA: Diagnosis not present

## 2018-04-18 DIAGNOSIS — H524 Presbyopia: Secondary | ICD-10-CM | POA: Diagnosis not present

## 2018-05-21 ENCOUNTER — Other Ambulatory Visit: Payer: Self-pay | Admitting: Internal Medicine

## 2018-05-21 DIAGNOSIS — Z1231 Encounter for screening mammogram for malignant neoplasm of breast: Secondary | ICD-10-CM

## 2018-05-22 ENCOUNTER — Encounter: Payer: Self-pay | Admitting: Internal Medicine

## 2018-05-22 ENCOUNTER — Ambulatory Visit (INDEPENDENT_AMBULATORY_CARE_PROVIDER_SITE_OTHER): Payer: Medicare Other | Admitting: Internal Medicine

## 2018-05-22 VITALS — BP 132/58 | HR 80 | Temp 97.0°F | Resp 16 | Ht 62.0 in | Wt 156.8 lb

## 2018-05-22 DIAGNOSIS — J00 Acute nasopharyngitis [common cold]: Secondary | ICD-10-CM

## 2018-05-22 DIAGNOSIS — D649 Anemia, unspecified: Secondary | ICD-10-CM | POA: Diagnosis not present

## 2018-05-22 DIAGNOSIS — E559 Vitamin D deficiency, unspecified: Secondary | ICD-10-CM

## 2018-05-22 DIAGNOSIS — Z1212 Encounter for screening for malignant neoplasm of rectum: Secondary | ICD-10-CM | POA: Diagnosis not present

## 2018-05-22 DIAGNOSIS — L659 Nonscarring hair loss, unspecified: Secondary | ICD-10-CM

## 2018-05-22 DIAGNOSIS — E039 Hypothyroidism, unspecified: Secondary | ICD-10-CM

## 2018-05-22 DIAGNOSIS — Z1211 Encounter for screening for malignant neoplasm of colon: Secondary | ICD-10-CM

## 2018-05-22 DIAGNOSIS — E782 Mixed hyperlipidemia: Secondary | ICD-10-CM

## 2018-05-22 DIAGNOSIS — Z136 Encounter for screening for cardiovascular disorders: Secondary | ICD-10-CM | POA: Diagnosis not present

## 2018-05-22 DIAGNOSIS — I10 Essential (primary) hypertension: Secondary | ICD-10-CM

## 2018-05-22 DIAGNOSIS — Z79899 Other long term (current) drug therapy: Secondary | ICD-10-CM

## 2018-05-22 DIAGNOSIS — R7309 Other abnormal glucose: Secondary | ICD-10-CM

## 2018-05-22 DIAGNOSIS — Z8249 Family history of ischemic heart disease and other diseases of the circulatory system: Secondary | ICD-10-CM

## 2018-05-22 DIAGNOSIS — R7303 Prediabetes: Secondary | ICD-10-CM

## 2018-05-22 MED ORDER — AZITHROMYCIN 250 MG PO TABS: ORAL_TABLET | ORAL | 1 refills | Status: DC

## 2018-05-22 MED ORDER — DEXAMETHASONE 0.5 MG PO TABS: ORAL_TABLET | ORAL | 0 refills | Status: DC

## 2018-05-22 NOTE — Patient Instructions (Addendum)
We Do NOT Approve of  [facility] [place] Soliciting Our Patients  To Do Home Visits & We Do NOT Approve of [employer] > > > > > > > > > > > > > > > > > > > > > > > > > > > > > > > > > > > > > > >  Preventive Care for Adults  A healthy lifestyle and preventive care can promote health and wellness. Preventive health guidelines for women include the following key practices.  A routine yearly physical is a good way to check with your health care provider about your health and preventive screening. It is a chance to share any concerns and updates on your health and to receive a thorough exam.  Visit your dentist for a routine exam and preventive care every 6 months. Brush your teeth twice a day and floss once a day. Good oral hygiene prevents tooth decay and gum disease.  The frequency of eye exams is based on your age, health, family medical history, use of contact lenses, and other factors. Follow your health care provider's recommendations for frequency of eye exams.  Eat a healthy diet. Foods like vegetables, fruits, whole grains, low-fat dairy products, and lean protein foods contain the nutrients you need without too many calories. Decrease your intake of foods high in solid fats, added sugars, and salt. Eat the right amount of calories for you. Get information about a proper diet from your health care provider, if necessary.  Regular physical exercise is one of the most important things you can do for your health. Most adults should get at least 150 minutes of moderate-intensity exercise (any activity that increases your heart rate and causes you to sweat) each week. In addition, most adults need muscle-strengthening exercises on 2 or more days a week.  Maintain a healthy weight. The body mass index (BMI) is a screening tool to identify possible weight problems. It provides an estimate of body fat based on height and weight. Your health care provider can find your BMI  and can help you achieve or maintain a healthy weight. For adults 20 years and older:  A BMI below 18.5 is considered underweight.  A BMI of 18.5 to 24.9 is normal.  A BMI of 25 to 29.9 is considered overweight.  A BMI of 30 and above is considered obese.  Maintain normal blood lipids and cholesterol levels by exercising and minimizing your intake of saturated fat. Eat a balanced diet with plenty of fruit and vegetables. If your lipid or cholesterol levels are high, you are over 50, or you are at high risk for heart disease, you may need your cholesterol levels checked more frequently. Ongoing high lipid and cholesterol levels should be treated with medicines if diet and exercise are not working.  If you smoke, find out from your health care provider how to quit. If you do not use tobacco, do not start.  Lung cancer screening is recommended for adults aged 55-80 years who are at high risk for developing lung cancer because of a history of smoking. A yearly low-dose CT scan of the lungs is recommended for people who have at least a 30-pack-year history of smoking and are a current smoker or have quit within the past 15 years. A pack year of smoking is smoking an average of 1 pack of cigarettes a day for 1 year (for example: 1 pack a day for 30 years or 2 packs a day   for 15 years). Yearly screening should continue until the smoker has stopped smoking for at least 15 years. Yearly screening should be stopped for people who develop a health problem that would prevent them from having lung cancer treatment.  Avoid use of street drugs. Do not share needles with anyone. Ask for help if you need support or instructions about stopping the use of drugs.  High blood pressure causes heart disease and increases the risk of stroke.  Ongoing high blood pressure should be treated with medicines if weight loss and exercise do not work.  If you are 29-34 years old, ask your health care provider if you should take  aspirin to prevent strokes.  Diabetes screening involves taking a blood sample to check your fasting blood sugar level. This should be done once every 3 years, after age 27, if you are within normal weight and without risk factors for diabetes. Testing should be considered at a younger age or be carried out more frequently if you are overweight and have at least 1 risk factor for diabetes.  Breast cancer screening is essential preventive care for women. You should practice "breast self-awareness." This means understanding the normal appearance and feel of your breasts and may include breast self-examination. Any changes detected, no matter how small, should be reported to a health care provider. Women in their 64s and 30s should have a clinical breast exam (CBE) by a health care provider as part of a regular health exam every 1 to 3 years. After age 69, women should have a CBE every year. Starting at age 6, women should consider having a mammogram (breast X-ray test) every year. Women who have a family history of breast cancer should talk to their health care provider about genetic screening. Women at a high risk of breast cancer should talk to their health care providers about having an MRI and a mammogram every year.  Breast cancer gene (BRCA)-related cancer risk assessment is recommended for women who have family members with BRCA-related cancers. BRCA-related cancers include breast, ovarian, tubal, and peritoneal cancers. Having family members with these cancers may be associated with an increased risk for harmful changes (mutations) in the breast cancer genes BRCA1 and BRCA2. Results of the assessment will determine the need for genetic counseling and BRCA1 and BRCA2 testing.  Routine pelvic exams to screen for cancer are no longer recommended for nonpregnant women who are considered low risk for cancer of the pelvic organs (ovaries, uterus, and vagina) and who do not have symptoms. Ask your health  care provider if a screening pelvic exam is right for you.  If you have had past treatment for cervical cancer or a condition that could lead to cancer, you need Pap tests and screening for cancer for at least 20 years after your treatment. If Pap tests have been discontinued, your risk factors (such as having a new sexual partner) need to be reassessed to determine if screening should be resumed. Some women have medical problems that increase the chance of getting cervical cancer. In these cases, your health care provider may recommend more frequent screening and Pap tests.    Colorectal cancer can be detected and often prevented. Most routine colorectal cancer screening begins at the age of 21 years and continues through age 8 years. However, your health care provider may recommend screening at an earlier age if you have risk factors for colon cancer. On a yearly basis, your health care provider may provide home test kits to check  for hidden blood in the stool. Use of a small camera at the end of a tube, to directly examine the colon (sigmoidoscopy or colonoscopy), can detect the earliest forms of colorectal cancer. Talk to your health care provider about this at age 50, when routine screening begins.  Direct exam of the colon should be repeated every 5-10 years through age 75 years, unless early forms of pre-cancerous polyps or small growths are found.  Osteoporosis is a disease in which the bones lose minerals and strength with aging. This can result in serious bone fractures or breaks. The risk of osteoporosis can be identified using a bone density scan. Women ages 65 years and over and women at risk for fractures or osteoporosis should discuss screening with their health care providers. Ask your health care provider whether you should take a calcium supplement or vitamin D to reduce the rate of osteoporosis.  Menopause can be associated with physical symptoms and risks. Hormone replacement therapy  is available to decrease symptoms and risks. You should talk to your health care provider about whether hormone replacement therapy is right for you.  Use sunscreen. Apply sunscreen liberally and repeatedly throughout the day. You should seek shade when your shadow is shorter than you. Protect yourself by wearing long sleeves, pants, a wide-brimmed hat, and sunglasses year round, whenever you are outdoors.  Once a month, do a whole body skin exam, using a mirror to look at the skin on your back. Tell your health care provider of new moles, moles that have irregular borders, moles that are larger than a pencil eraser, or moles that have changed in shape or color.  Stay current with required vaccines (immunizations).  Influenza vaccine. All adults should be immunized every year.  Tetanus, diphtheria, and acellular pertussis (Td, Tdap) vaccine. Pregnant women should receive 1 dose of Tdap vaccine during each pregnancy. The dose should be obtained regardless of the length of time since the last dose. Immunization is preferred during the 27th-36th week of gestation. An adult who has not previously received Tdap or who does not know her vaccine status should receive 1 dose of Tdap. This initial dose should be followed by tetanus and diphtheria toxoids (Td) booster doses every 10 years. Adults with an unknown or incomplete history of completing a 3-dose immunization series with Td-containing vaccines should begin or complete a primary immunization series including a Tdap dose. Adults should receive a Td booster every 10 years.    Zoster vaccine. One dose is recommended for adults aged 60 years or older unless certain conditions are present.    Pneumococcal 13-valent conjugate (PCV13) vaccine. When indicated, a person who is uncertain of her immunization history and has no record of immunization should receive the PCV13 vaccine. An adult aged 19 years or older who has certain medical conditions and has not  been previously immunized should receive 1 dose of PCV13 vaccine. This PCV13 should be followed with a dose of pneumococcal polysaccharide (PPSV23) vaccine. The PPSV23 vaccine dose should be obtained at least 1 or more year(s) after the dose of PCV13 vaccine. An adult aged 19 years or older who has certain medical conditions and previously received 1 or more doses of PPSV23 vaccine should receive 1 dose of PCV13. The PCV13 vaccine dose should be obtained 1 or more years after the last PPSV23 vaccine dose.    Pneumococcal polysaccharide (PPSV23) vaccine. When PCV13 is also indicated, PCV13 should be obtained first. All adults aged 65 years and older should   be immunized. An adult younger than age 65 years who has certain medical conditions should be immunized. Any person who resides in a nursing home or long-term care facility should be immunized. An adult smoker should be immunized. People with an immunocompromised condition and certain other conditions should receive both PCV13 and PPSV23 vaccines. People with human immunodeficiency virus (HIV) infection should be immunized as soon as possible after diagnosis. Immunization during chemotherapy or radiation therapy should be avoided. Routine use of PPSV23 vaccine is not recommended for American Indians, [place] Natives, or people younger than 65 years unless there are medical conditions that require PPSV23 vaccine. When indicated, people who have unknown immunization and have no record of immunization should receive PPSV23 vaccine. One-time revaccination 5 years after the first dose of PPSV23 is recommended for people aged 19-64 years who have chronic kidney failure, nephrotic syndrome, asplenia, or immunocompromised conditions. People who received 1-2 doses of PPSV23 before age 65 years should receive another dose of PPSV23 vaccine at age 65 years or later if at least 5 years have passed since the previous dose. Doses of PPSV23 are not needed for people immunized  with PPSV23 at or after age 65 years.   Preventive Services / Frequency  Ages 65 years and over  Blood pressure check.  Lipid and cholesterol check.  Lung cancer screening. / Every year if you are aged 55-80 years and have a 30-pack-year history of smoking and currently smoke or have quit within the past 15 years. Yearly screening is stopped once you have quit smoking for at least 15 years or develop a health problem that would prevent you from having lung cancer treatment.  Clinical breast exam.** / Every year after age 40 years.   BRCA-related cancer risk assessment.** / For women who have family members with a BRCA-related cancer (breast, ovarian, tubal, or peritoneal cancers).  Mammogram.** / Every year beginning at age 40 years and continuing for as long as you are in good health. Consult with your health care provider.  Pap test.** / Every 3 years starting at age 30 years through age 65 or 70 years with 3 consecutive normal Pap tests. Testing can be stopped between 65 and 70 years with 3 consecutive normal Pap tests and no abnormal Pap or HPV tests in the past 10 years.  Fecal occult blood test (FOBT) of stool. / Every year beginning at age 50 years and continuing until age 75 years. You may not need to do this test if you get a colonoscopy every 10 years.  Flexible sigmoidoscopy or colonoscopy.** / Every 5 years for a flexible sigmoidoscopy or every 10 years for a colonoscopy beginning at age 50 years and continuing until age 75 years.  Hepatitis C blood test.** / For all people born from 1945 through 1965 and any individual with known risks for hepatitis C.  Osteoporosis screening.** / A one-time screening for women ages 65 years and over and women at risk for fractures or osteoporosis.  Skin self-exam. / Monthly.  Influenza vaccine. / Every year.  Tetanus, diphtheria, and acellular pertussis (Tdap/Td) vaccine.** / 1 dose of Td every 10 years.  Zoster vaccine.** / 1 dose  for adults aged 60 years or older.  Pneumococcal 13-valent conjugate (PCV13) vaccine.** / Consult your health care provider.  Pneumococcal polysaccharide (PPSV23) vaccine.** / 1 dose for all adults aged 65 years and older. Screening for abdominal aortic aneurysm (AAA)  by ultrasound is recommended for people who have history of high blood pressure   or who are current or former smokers. ++++++++++++++++++++ Recommend Adult Low Dose Aspirin or  coated  Aspirin 81 mg daily  To reduce risk of Colon Cancer 20 %,  Skin Cancer 26 % ,  Melanoma 46%  and  Pancreatic cancer 60% ++++++++++++++++++++ Vitamin D goal  is between 70-100.  Please make sure that you are taking your Vitamin D as directed.  It is very important as a natural anti-inflammatory  helping hair, skin, and nails, as well as reducing stroke and heart attack risk.  It helps your bones and helps with mood. It also decreases numerous cancer risks so please take it as directed.  Low Vit D is associated with a 200-300% higher risk for CANCER  and 200-300% higher risk for HEART   ATTACK  &  STROKE.   ...................................... It is also associated with higher death rate at younger ages,  autoimmune diseases like Rheumatoid arthritis, Lupus, Multiple Sclerosis.    Also many other serious conditions, like depression, Alzheimer's Dementia, infertility, muscle aches, fatigue, fibromyalgia - just to name a few. ++++++++++++++++++ Recommend the book "The END of DIETING" by Dr Joel Fuhrman  & the book "The END of DIABETES " by Dr Joel Fuhrman At [employer] - get book & Audio CD's    Being diabetic has a  300% increased risk for heart attack, stroke, cancer, and alzheimer- type vascular dementia. It is very important that you work harder with diet by avoiding all foods that are white. Avoid white rice (brown & wild rice is OK), white potatoes (sweetpotatoes in moderation is OK), White bread or wheat bread or anything made out of  white flour like bagels, donuts, rolls, buns, biscuits, cakes, pastries, cookies, pizza crust, and pasta (made from white flour & egg whites) - vegetarian pasta or spinach or wheat pasta is OK. Multigrain breads like Arnold's or Pepperidge Farm, or multigrain sandwich thins or flatbreads.  Diet, exercise and weight loss can reverse and cure diabetes in the early stages.  Diet, exercise and weight loss is very important in the control and prevention of complications of diabetes which affects every system in your body, ie. Brain - dementia/stroke, eyes - glaucoma/blindness, heart - heart attack/heart failure, kidneys - dialysis, stomach - gastric paralysis, intestines - malabsorption, nerves - severe painful neuritis, circulation - gangrene & loss of a leg(s), and finally cancer and Alzheimers.    I recommend avoid fried & greasy foods,  sweets/candy, white rice (brown or wild rice or Quinoa is OK), white potatoes (sweet potatoes are OK) - anything made from white flour - bagels, doughnuts, rolls, buns, biscuits,white and wheat breads, pizza crust and traditional pasta made of white flour & egg white(vegetarian pasta or spinach or wheat pasta is OK).  Multi-grain bread is OK - like multi-grain flat bread or sandwich thins. Avoid alcohol in excess. Exercise is also important.    Eat all the vegetables you want - avoid meat, especially red meat and dairy - especially cheese.  Cheese is the most concentrated form of trans-fats which is the worst thing to clog up our arteries. Veggie cheese is OK which can be found in the fresh produce section at [employer] or [employer] or [employer]  +++++++++++++++++++ DASH Eating Plan  DASH stands for "Dietary Approaches to Stop Hypertension."   The DASH eating plan is a healthy eating plan that has been shown to reduce high blood pressure (hypertension). Additional health benefits may include reducing the risk of type 2 diabetes mellitus, heart disease,   and stroke. The  DASH eating plan may also help with weight loss. WHAT DO I NEED TO KNOW ABOUT THE DASH EATING PLAN? For the DASH eating plan, you will follow these general guidelines:  Choose foods with a percent daily value for sodium of less than 5% (as listed on the food label).  Use salt-free seasonings or herbs instead of table salt or sea salt.  Check with your health care provider or pharmacist before using salt substitutes.  Eat lower-sodium products, often labeled as "lower sodium" or "no salt added."  Eat fresh foods.  Eat more vegetables, fruits, and low-fat dairy products.  Choose whole grains. Look for the word "whole" as the first word in the ingredient list.  Choose fish   Limit sweets, desserts, sugars, and sugary drinks.  Choose heart-healthy fats.  Eat veggie cheese   Eat more home-cooked food and less restaurant, buffet, and fast food.  Limit fried foods.  Cook foods using methods other than frying.  Limit canned vegetables. If you do use them, rinse them well to decrease the sodium.  When eating at a restaurant, ask that your food be prepared with less salt, or no salt if possible.                      WHAT FOODS CAN I EAT? Read Dr Fara Olden Fuhrman's books on The End of Dieting & The End of Diabetes  Grains Whole grain or whole wheat bread. Brown rice. Whole grain or whole wheat pasta. Quinoa, bulgur, and whole grain cereals. Low-sodium cereals. Corn or whole wheat flour tortillas. Whole grain cornbread. Whole grain crackers. Low-sodium crackers.  Vegetables Fresh or frozen vegetables (raw, steamed, roasted, or grilled). Low-sodium or reduced-sodium tomato and vegetable juices. Low-sodium or reduced-sodium tomato sauce and paste. Low-sodium or reduced-sodium canned vegetables.   Fruits All fresh, canned (in natural juice), or frozen fruits.  Protein Products  All fish and seafood.  Dried beans, peas, or lentils. Unsalted nuts and seeds. Unsalted canned  beans.  Dairy Low-fat dairy products, such as skim or 1% milk, 2% or reduced-fat cheeses, low-fat ricotta or cottage cheese, or plain low-fat yogurt. Low-sodium or reduced-sodium cheeses.  Fats and Oils Tub margarines without trans fats. Light or reduced-fat mayonnaise and salad dressings (reduced sodium). Avocado. Safflower, olive, or canola oils. Natural peanut or almond butter.  Other Unsalted popcorn and pretzels. The items listed above may not be a complete list of recommended foods or beverages. Contact your dietitian for more options.  +++++++++++++++  WHAT FOODS ARE NOT RECOMMENDED? Grains/ White flour or wheat flour White bread. White pasta. White rice. Refined cornbread. Bagels and croissants. Crackers that contain trans fat.  Vegetables  Creamed or fried vegetables. Vegetables in a . Regular canned vegetables. Regular canned tomato sauce and paste. Regular tomato and vegetable juices.  Fruits Dried fruits. Canned fruit in light or heavy syrup. Fruit juice.  Meat and Other Protein Products Meat in general - RED meat & White meat.  Fatty cuts of meat. Ribs, chicken wings, all processed meats as bacon, sausage, bologna, salami, fatback, hot dogs, bratwurst and packaged luncheon meats.  Dairy Whole or 2% milk, cream, half-and-half, and cream cheese. Whole-fat or sweetened yogurt. Full-fat cheeses or blue cheese. Non-dairy creamers and whipped toppings. Processed cheese, cheese spreads, or cheese curds.  Condiments Onion and garlic salt, seasoned salt, table salt, and sea salt. Canned and packaged gravies. Worcestershire sauce. Tartar sauce. Barbecue sauce. Teriyaki sauce. Soy sauce, including reduced  sodium. Steak sauce. Fish sauce. Oyster sauce. Cocktail sauce. Horseradish. Ketchup and mustard. Meat flavorings and tenderizers. Bouillon cubes. Hot sauce. Tabasco sauce. Marinades. Taco seasonings. Relishes.  Fats and Oils Butter, stick margarine, lard, shortening and bacon  fat. Coconut, palm kernel, or palm oils. Regular salad dressings.  Pickles and olives. Salted popcorn and pretzels.  The items listed above may not be a complete list of foods and beverages to avoid.  ++++++++++++++++++++++++++++++++ Zinc 40 to 50 mg daily   Tumeric 900 to 1000 mg 2 to 3 x /day  - with Bioperine  (Black Pepper Extract)  Iron 325 mg daily

## 2018-05-22 NOTE — Progress Notes (Signed)
[facility] Unk Pinto, M.D.     Uvaldo Bristle. Silverio Lay, P.A.-C Liane Comber, [place] [address] [place], [place] [address] Telephone [phone] Telefax [phone]  Comprehensive Evaluation &  Examination     This very nice 77 y.o. [facility]  presents for a  comprehensive evaluation and management of multiple medical co-morbidities.  Patient has been followed for HTN, HLD, Hypothyroidism, Prediabetes  and Vitamin D Deficiency. Patient's GERD is controlled wit diet & OTC antacids. Patient has remote hx/o anemia , normal of f/u's.       HTN predates since 2008. Patient's BP has been controlled at home and patient denies any cardiac symptoms as chest pain, palpitations, shortness of breath, dizziness or ankle swelling. Today's BP is at goal - 132/58.     Patient's hyperlipidemia is not controlled with diet. Last lipids were not at goal: Lab Results  Component Value Date   CHOL 175 11/20/2017   HDL 44 (L) 11/20/2017   LDLCALC 106 (H) 11/20/2017   TRIG 142 11/20/2017   CHOLHDL 4.0 11/20/2017      Patient has hx/o prediabetes (A1c 6.2% / 2011 & 6.0% / 2016)  and patient denies reactive hypoglycemic symptoms, visual blurring, diabetic polys or paresthesias. Last A1c was not at goal: Lab Results  Component Value Date   HGBA1C 5.8 (H) 11/20/2017      Patient has been on Thyroid replacement since 2014.        Finally, patient has history of Vitamin D Deficiency ("13" / 2008)  and last Vitamin D was at goal: Lab Results  Component Value Date   VD25OH 74 11/20/2017   Current Outpatient Medications on File Prior to Visit  Medication Sig  . aspirin 81 MG chewable tablet Chew by mouth daily.  . Cholecalciferol (VITAMIN D PO) Take 10,000 Int'l Units by mouth daily.  . fluocinonide cream (LIDEX) 0.05 % as needed.   Javier Docker Oil 1000 MG CAPS Take by mouth daily.   Marland Kitchen levothyroxine (SYNTHROID, LEVOTHROID) 50 MCG tablet  TAKE 1/2 TO 1 TABLET BY MOUTH EVERY DAY AS DIRECTED  . OVER THE COUNTER MEDICATION daily. [facility] brand allergy pill  . promethazine-dextromethorphan (PROMETHAZINE-DM) 6.25-15 MG/5ML syrup Take 1 to 2 tsp enery 4 hours if needed for cough   No current facility-administered medications on file prior to visit.    No Known Allergies   Past Medical History:  Diagnosis Date  . Arthritis   . Hyperlipidemia   . Hypertension   . Prediabetes   . Thyroid disease   . Vitamin D deficiency    Health Maintenance  Topic Date Due  . INFLUENZA VACCINE  02/13/2018  . TETANUS/TDAP  04/08/2024  . DEXA SCAN  Completed  . PNA vac Low Risk Adult  Completed   Immunization History  Administered Date(s) Administered  . DT 04/08/2014  . Influenza, High Dose Seasonal PF 04/18/2015, 05/15/2016, 05/22/2017  . Pneumococcal Conjugate-13 05/15/2016  . Pneumococcal Polysaccharide-23 11/18/2006, 04/08/2014  . Td 07/16/2002  . Zoster 01/29/2007   Last Colon - 01/13/2007 - Deatra Ina - recc 10 yr f/u - overdue Cologard - 08/03/2017 - Negative  Last MGM - scheduled 07/11/2018   Past Surgical History:  Procedure Laterality Date  . APPENDECTOMY    . OTHER SURGICAL HISTORY  1981   BTL   . TONSILLECTOMY     Family History  Problem Relation Age of Onset  . Heart disease Mother   . Asthma Mother   .  COPD Father   . Heart disease Father   . Gout Father   . Cancer Brother   . Breast cancer Neg Hx    Social History   Tobacco Use  . Smoking status: Never Smoker  . Smokeless tobacco: Never Used  Substance Use Topics  . Alcohol use: Yes    Comment: rarely  . Drug use: No    ROS Constitutional: Denies fever, chills, weight loss/gain, headaches, insomnia,  night sweats, and change in appetite. Does c/o fatigue. Eyes: Denies redness, blurred vision, diplopia, discharge, itchy, watery eyes.  ENT: Denies discharge, congestion, post nasal drip, epistaxis, sore throat, earache, hearing loss, dental pain,  Tinnitus, Vertigo, Sinus pain, snoring.  Cardio: Denies chest pain, palpitations, irregular heartbeat, syncope, dyspnea, diaphoresis, orthopnea, PND, claudication, edema Respiratory: denies cough, dyspnea, DOE, pleurisy, hoarseness, laryngitis, wheezing.  Gastrointestinal: Denies dysphagia, heartburn, reflux, water brash, pain, cramps, nausea, vomiting, bloating, diarrhea, constipation, hematemesis, melena, hematochezia, jaundice, hemorrhoids Genitourinary: Denies dysuria, frequency, urgency, nocturia, hesitancy, discharge, hematuria, flank pain Breast: Breast lumps, nipple discharge, bleeding.  Musculoskeletal: Denies arthralgia, myalgia, stiffness, Jt. Swelling, pain, limp, and strain/sprain. Denies falls. Skin: Denies puritis, rash, hives, warts, acne, eczema, changing in skin lesion Neuro: No weakness, tremor, incoordination, spasms, paresthesia, pain Psychiatric: Denies confusion, memory loss, sensory loss. Denies Depression. Endocrine: Denies change in weight, skin, hair change, nocturia, and paresthesia, diabetic polys, visual blurring, hyper / hypo glycemic episodes.  Heme/Lymph: No excessive bleeding, bruising, enlarged lymph nodes.  Physical Exam  BP (!) 132/58   Pulse 80   Temp (!) 97 F (36.1 C)   Resp 16   Ht 5\' 2"  (1.575 m)   Wt 156 lb 12.8 oz (71.1 kg)   BMI 28.68 kg/m   General Appearance: Well nourished, well groomed and in no apparent distress.  Eyes: PERRLA, EOMs, conjunctiva no swelling or erythema, normal fundi and vessels. Sinuses: No frontal/maxillary tenderness ENT/Mouth: EACs patent / TMs  nl. Nares clear without erythema, swelling, mucoid exudates. Oral hygiene is good. No erythema, swelling, or exudate. Tongue normal, non-obstructing. Tonsils not swollen or erythematous. Hearing normal.  Neck: Supple, thyroid not palpable. No bruits, nodes or JVD. Respiratory: Respiratory effort normal.  BS equal and clear bilateral without rales, rhonci, wheezing or  stridor. Cardio: Heart sounds are normal with regular rate and rhythm and no murmurs, rubs or gallops. Peripheral pulses are normal and equal bilaterally without edema. No aortic or femoral bruits. Chest: symmetric with normal excursions and percussion. Breasts: Symmetric, without lumps, nipple discharge, retractions, or fibrocystic changes.  Abdomen: Flat, soft with bowel sounds active. Nontender, no guarding, rebound, hernias, masses, or organomegaly.  Lymphatics: Non tender without lymphadenopathy.  Musculoskeletal: Full ROM all peripheral extremities, joint stability, 5/5 strength, and normal gait. Skin: Warm and dry without rashes, lesions, cyanosis, clubbing or  ecchymosis.  Neuro: Cranial nerves intact, reflexes equal bilaterally. Normal muscle tone, no cerebellar symptoms. Sensation intact.  Pysch: Alert and oriented X 3, normal affect, Insight and Judgment appropriate.   Assessment and Plan  1. Essential hypertension  - EKG 12-Lead - Urinalysis, Routine w reflex microscopic - Microalbumin / creatinine urine ratio - CBC with Differential/Platelet - COMPLETE METABOLIC PANEL WITH GFR - Magnesium - TSH  2. Hyperlipidemia, mixed  - EKG 12-Lead - Lipid panel - TSH  3. Abnormal glucose  - EKG 12-Lead - Hemoglobin A1c - Insulin, random  4. Vitamin D deficiency  - VITAMIN D 25 Hydroxyl  5. Prediabetes  - EKG 12-Lead - Hemoglobin A1c - Insulin,  random  6. Hypothyroidism  - TSH  7. Anemia, unspecified type  - CBC with Differential/Platelet  8. Screening for colorectal cancer  - POC Hemoccult Bld/Stl  - CBC with Differential/Platelet  9. Screening for ischemic heart disease  - EKG 12-Lead  10. FHx: heart disease  - EKG 12-Lead  11. Medication management  - Urinalysis, Routine w reflex microscopic - Microalbumin / creatinine urine ratio - CBC with Differential/Platelet - COMPLETE METABOLIC PANEL WITH GFR - Magnesium - Lipid panel - TSH -  Hemoglobin A1c - Insulin, random - VITAMIN D 25 Hydroxyl        Patient was counseled in prudent diet to achieve/maintain BMI less than 25 for weight control, BP monitoring, regular exercise and medications. Discussed med's effects and SE's. Screening labs and tests as requested with regular follow-up as recommended. Over 40 minutes of exam, counseling, chart review and high complex critical decision making was performed.

## 2018-05-23 LAB — LIPID PANEL
CHOL/HDL RATIO: 4 (calc) (ref <5.0)
Cholesterol: 169 mg/dL (ref <200)
HDL: 42 mg/dL — ABNORMAL LOW (ref >50)
LDL CHOLESTEROL (CALC): 107 mg/dL — AB
NON-HDL CHOLESTEROL (CALC): 127 mg/dL (ref <130)
Triglycerides: 106 mg/dL (ref <150)

## 2018-05-23 LAB — HEMOGLOBIN A1C
HEMOGLOBIN A1C: 5.9 %{Hb} — AB (ref <5.7)
Mean Plasma Glucose: 123 (calc)
eAG (mmol/L): 6.8 (calc)

## 2018-05-23 LAB — MICROALBUMIN / CREATININE URINE RATIO
CREATININE, URINE: 139 mg/dL (ref 20–275)
MICROALB UR: 0.6 mg/dL
MICROALB/CREAT RATIO: 4 ug/mg{creat} (ref <30)

## 2018-05-23 LAB — CBC WITH DIFFERENTIAL/PLATELET
BASOS ABS: 79 {cells}/uL (ref 0–200)
Basophils Relative: 1.1 %
EOS ABS: 216 {cells}/uL (ref 15–500)
Eosinophils Relative: 3 %
HEMATOCRIT: 40.7 % (ref 35.0–45.0)
Hemoglobin: 13.7 g/dL (ref 11.7–15.5)
LYMPHS ABS: 2599 {cells}/uL (ref 850–3900)
MCH: 27.7 pg (ref 27.0–33.0)
MCHC: 33.7 g/dL (ref 32.0–36.0)
MCV: 82.2 fL (ref 80.0–100.0)
MPV: 10 fL (ref 7.5–12.5)
Monocytes Relative: 13 %
NEUTROS PCT: 46.8 %
Neutro Abs: 3370 cells/uL (ref 1500–7800)
Platelets: 293 10*3/uL (ref 140–400)
RBC: 4.95 10*6/uL (ref 3.80–5.10)
RDW: 13 % (ref 11.0–15.0)
Total Lymphocyte: 36.1 %
WBC mixed population: 936 cells/uL (ref 200–950)
WBC: 7.2 10*3/uL (ref 3.8–10.8)

## 2018-05-23 LAB — COMPLETE METABOLIC PANEL WITH GFR
AG Ratio: 1.6 (calc) (ref 1.0–2.5)
ALKALINE PHOSPHATASE (APISO): 77 U/L (ref 33–130)
ALT: 15 U/L (ref 6–29)
AST: 18 U/L (ref 10–35)
Albumin: 4.1 g/dL (ref 3.6–5.1)
BUN: 15 mg/dL (ref 7–25)
CALCIUM: 9.5 mg/dL (ref 8.6–10.4)
CO2: 30 mmol/L (ref 20–32)
CREATININE: 0.82 mg/dL (ref 0.60–0.93)
Chloride: 106 mmol/L (ref 98–110)
GFR, EST AFRICAN AMERICAN: 81 mL/min/{1.73_m2} (ref >=60)
GFR, EST NON AFRICAN AMERICAN: 70 mL/min/{1.73_m2} (ref >=60)
GLOBULIN: 2.5 g/dL (ref 1.9–3.7)
Glucose, Bld: 97 mg/dL (ref 65–99)
Potassium: 4.5 mmol/L (ref 3.5–5.3)
SODIUM: 141 mmol/L (ref 135–146)
Total Bilirubin: 0.3 mg/dL (ref 0.2–1.2)
Total Protein: 6.6 g/dL (ref 6.1–8.1)

## 2018-05-23 LAB — URINALYSIS, ROUTINE W REFLEX MICROSCOPIC
Bilirubin Urine: NEGATIVE
GLUCOSE, UA: NEGATIVE
Hgb urine dipstick: NEGATIVE
Ketones, ur: NEGATIVE
LEUKOCYTES UA: NEGATIVE
Nitrite: NEGATIVE
PROTEIN: NEGATIVE
Specific Gravity, Urine: 1.02 (ref 1.001–1.03)
pH: 5.5 (ref 5.0–8.0)

## 2018-05-23 LAB — TSH: TSH: 3.88 m[IU]/L (ref 0.40–4.50)

## 2018-05-23 LAB — INSULIN, RANDOM: Insulin: 16.7 u[IU]/mL (ref 2.0–19.6)

## 2018-05-23 LAB — MAGNESIUM: Magnesium: 2 mg/dL (ref 1.5–2.5)

## 2018-05-23 LAB — VITAMIN D 25 HYDROXY (VIT D DEFICIENCY, FRACTURES): Vit D, 25-Hydroxy: 59 ng/mL (ref 30–100)

## 2018-05-25 LAB — ZINC: Zinc: 60 ug/dL (ref 60–130)

## 2018-05-25 LAB — IRON, TOTAL/TOTAL IRON BINDING CAP
%SAT: 12 % (calc) — ABNORMAL LOW (ref 16–45)
Iron: 35 ug/dL — ABNORMAL LOW (ref 45–160)
TIBC: 301 mcg/dL (calc) (ref 250–450)

## 2018-05-25 LAB — TESTOSTERONE, TOTAL, LC/MS/MS: TESTOSTERONE, TOTAL, LC-MS-MS: 26 ng/dL (ref 2–45)

## 2018-06-03 ENCOUNTER — Other Ambulatory Visit: Payer: Self-pay

## 2018-06-20 ENCOUNTER — Ambulatory Visit (INDEPENDENT_AMBULATORY_CARE_PROVIDER_SITE_OTHER): Payer: Medicare Other

## 2018-06-20 VITALS — Temp 97.4°F

## 2018-06-20 DIAGNOSIS — Z23 Encounter for immunization: Secondary | ICD-10-CM

## 2018-06-25 DIAGNOSIS — L570 Actinic keratosis: Secondary | ICD-10-CM | POA: Diagnosis not present

## 2018-06-25 DIAGNOSIS — D2272 Melanocytic nevi of left lower limb, including hip: Secondary | ICD-10-CM | POA: Diagnosis not present

## 2018-06-25 DIAGNOSIS — L918 Other hypertrophic disorders of the skin: Secondary | ICD-10-CM | POA: Diagnosis not present

## 2018-06-25 DIAGNOSIS — Z85828 Personal history of other malignant neoplasm of skin: Secondary | ICD-10-CM | POA: Diagnosis not present

## 2018-06-25 DIAGNOSIS — D1801 Hemangioma of skin and subcutaneous tissue: Secondary | ICD-10-CM | POA: Diagnosis not present

## 2018-06-25 DIAGNOSIS — L814 Other melanin hyperpigmentation: Secondary | ICD-10-CM | POA: Diagnosis not present

## 2018-06-25 DIAGNOSIS — D692 Other nonthrombocytopenic purpura: Secondary | ICD-10-CM | POA: Diagnosis not present

## 2018-06-25 DIAGNOSIS — L821 Other seborrheic keratosis: Secondary | ICD-10-CM | POA: Diagnosis not present

## 2018-06-25 DIAGNOSIS — D2239 Melanocytic nevi of other parts of face: Secondary | ICD-10-CM | POA: Diagnosis not present

## 2018-06-25 DIAGNOSIS — D225 Melanocytic nevi of trunk: Secondary | ICD-10-CM | POA: Diagnosis not present

## 2018-07-11 ENCOUNTER — Ambulatory Visit: Payer: Medicare Other

## 2018-07-28 ENCOUNTER — Ambulatory Visit
Admission: RE | Admit: 2018-07-28 | Discharge: 2018-07-28 | Disposition: A | Discharge status: Home / Self Care | Payer: Medicare Other | Source: Ambulatory Visit | Attending: Internal Medicine | Admitting: Internal Medicine

## 2018-07-28 DIAGNOSIS — Z1231 Encounter for screening mammogram for malignant neoplasm of breast: Secondary | ICD-10-CM

## 2018-07-29 ENCOUNTER — Other Ambulatory Visit: Payer: Self-pay

## 2018-07-29 DIAGNOSIS — Z1211 Encounter for screening for malignant neoplasm of colon: Secondary | ICD-10-CM

## 2018-07-29 DIAGNOSIS — Z1212 Encounter for screening for malignant neoplasm of rectum: Principal | ICD-10-CM

## 2018-07-29 LAB — POC HEMOCCULT BLD/STL (HOME/3-CARD/SCREEN)
Card #2 Fecal Occult Blod, POC: NEGATIVE
FECAL OCCULT BLD: NEGATIVE
FECAL OCCULT BLD: NEGATIVE

## 2018-07-30 ENCOUNTER — Encounter: Payer: Self-pay | Admitting: Internal Medicine

## 2018-07-30 DIAGNOSIS — Z1211 Encounter for screening for malignant neoplasm of colon: Secondary | ICD-10-CM | POA: Diagnosis not present

## 2018-08-26 NOTE — Progress Notes (Signed)
MEDICARE ANNUAL WELLNESS VISIT AND FOLLOW UP  Assessment:   Diagnoses and all orders for this visit:  Encounter for Medicare annual wellness exam  Essential hypertension Labile; currently managed without medications - will continue to monitor and initiate treatment for persistently elevated BPs as indicated Monitor blood pressure at home; call if consistently over 130/80 Continue DASH diet.   Reminder to go to the ER if any CP, SOB, nausea, dizziness, severe HA, changes vision/speech, left arm numbness and tingling and jaw pain.  Hypothyroidism, unspecified type continue medications the same reminded to take on an empty stomach 30-65mins before food.  -     TSH  Mixed hyperlipidemia At goal; continue medications krill oil supplement Continue low cholesterol diet and exercise.  Defer lipid panel  Vitamin D deficiency Continue supplementation Defer checking vitamin D level  Prediabetes Discussed disease and risks Discussed diet/exercise, weight management  A1C  Medication management -     CBC with Differential/Platelet -     CMP/GFR  Overweight Long discussion about weight loss, diet, and exercise Recommended diet heavy in fruits and veggies and low in animal meats, cheeses, and dairy products, appropriate calorie intake Follow up in 6 months   Over 40 minutes of exam, counseling, chart review and critical decision making was performed Future Appointments  Date Time Provider [place]  12/05/2018 10:30 AM Unk Pinto, MD [facility] None  06/08/2019  9:00 AM Unk Pinto, MD [facility] None     Plan:   During the course of the visit the patient was educated and counseled about appropriate screening and preventive services including:    Pneumococcal vaccine   Prevnar 13  Influenza vaccine  Td vaccine  Screening electrocardiogram  Bone densitometry screening  Colorectal cancer screening  Diabetes screening  Glaucoma  screening  Nutrition counseling   Advanced directives: requested   Subjective:  Anna Caldwell is a remarkably healthy 78 y.o. female who presents for Medicare Annual Wellness Visit and 3 month follow up.   she has a diagnosis of GERD which is currently managed by lifestyle modification she reports symptoms is currently well controlled, and denies breakthrough reflux, burning in chest, hoarseness or cough.    BMI is Body mass index is 28.53 kg/m., she has been working on diet and exercise. Wt Readings from Last 3 Encounters:  08/27/18 156 lb (70.8 kg)  05/22/18 156 lb 12.8 oz (71.1 kg)  11/20/17 158 lb (71.7 kg)    Her blood pressure has been controlled at home, today their BP is BP: 114/64 She does not workout. She denies chest pain, shortness of breath, dizziness.   She is not on cholesterol medication and denies myalgias. Her cholesterol is not at goal. The cholesterol last visit was:   Lab Results  Component Value Date   CHOL 169 05/22/2018   HDL 42 (L) 05/22/2018   LDLCALC 107 (H) 05/22/2018   TRIG 106 05/22/2018   CHOLHDL 4.0 05/22/2018    She has been working on diet and exercise for prediabetes, and denies increased appetite, nausea, paresthesia of the feet, polydipsia, polyuria, visual disturbances and vomiting. Last A1C in the office was back within normal range:  Lab Results  Component Value Date   HGBA1C 5.9 (H) 05/22/2018   Last GFR: Lab Results  Component Value Date   GFRNONAA 70 05/22/2018   She is on thyroid medication. Her medication was not changed last visit.   Lab Results  Component Value Date   TSH 3.88 05/22/2018   Patient  is on Vitamin D supplement but remained below goal at the last visit:    Lab Results  Component Value Date   VD25OH 59 05/22/2018      Medication Review: Current Outpatient Medications on File Prior to Visit  Medication Sig Dispense Refill  . aspirin 81 MG chewable tablet Chew by mouth daily.    . Cholecalciferol  (VITAMIN D PO) Take 10,000 Int'l Units by mouth daily.    . fluocinonide cream (LIDEX) 0.05 % as needed.     . IRON, FERROUS SULFATE, PO Take by mouth.    Javier Docker Oil 1000 MG CAPS Take by mouth daily.     Marland Kitchen levothyroxine (SYNTHROID, LEVOTHROID) 50 MCG tablet TAKE 1/2 TO 1 TABLET BY MOUTH EVERY DAY AS DIRECTED 90 tablet 1  . OVER THE COUNTER MEDICATION daily. [facility] brand allergy pill     No current facility-administered medications on file prior to visit.     No Known Allergies  Current Problems (verified) Patient Active Problem List   Diagnosis Date Noted  . Overweight (BMI 25.0-29.9) 04/18/2015  . Medication management 06/22/2013  . Hypertension   . Hyperlipidemia   . Vitamin D deficiency   . Hypothyroidism   . Prediabetes     Screening Tests Immunization History  Administered Date(s) Administered  . DT 04/08/2014  . Influenza, High Dose Seasonal PF 04/18/2015, 05/15/2016, 05/22/2017, 06/20/2018  . Pneumococcal Conjugate-13 05/15/2016  . Pneumococcal Polysaccharide-23 11/18/2006, 04/08/2014  . Td 07/16/2002  . Zoster 01/29/2007   Preventative care: Last colonoscopy: 12/2006  Last cologuard: 07/2017 neg Last mammogram: 07/2018 Last pap smear/pelvic exam: 2011, will not have another DEXA: 2011 will order  Prior vaccinations: TD or Tdap: 2015  Influenza: 2019 Pneumococcal: 2015 Prevnar13: 2017 Shingles/Zostavax: 2008  Names of Other Physician/Practitioners you currently use: 1. [facility] here for primary care 2. [facility], Dr. Valetta Close, eye doctor, last visit 2019 3. [facility], Dr. Criss Alvine, dentist, last visit  2019  Patient Care Team: Unk Pinto, MD as PCP - General (Internal Medicine) Inda Castle, MD (Inactive) as Consulting Physician (Gastroenterology) Marygrace Drought, MD as Consulting Physician (Ophthalmology) Rolm Bookbinder, MD as Consulting Physician (Dermatology)  SURGICAL HISTORY She  has a  past surgical history that includes Appendectomy; Tonsillectomy; and Other surgical history (1981). FAMILY HISTORY Her family history includes Asthma in her mother; COPD in her father; Cancer in her brother; Gout in her father; Heart disease in her father and mother. SOCIAL HISTORY She  reports that she has never smoked. She has never used smokeless tobacco. She reports current alcohol use. She reports that she does not use drugs.   MEDICARE WELLNESS OBJECTIVES: Physical activity: Current Exercise Habits: Home exercise routine, Type of exercise: walking, Time (Minutes): 15, Frequency (Times/Week): 6, Weekly Exercise (Minutes/Week): 90, Intensity: Mild, Exercise limited by: None identified Cardiac risk factors: Cardiac Risk Factors include: advanced age (>79men, >70 women);hypertension;dyslipidemia Depression/mood screen:   Depression screen [facility] 2/9 08/27/2018  Decreased Interest 0  Down, Depressed, Hopeless 0  PHQ - 2 Score 0    ADLs:  In your present state of health, do you have any difficulty performing the following activities: 08/27/2018 05/22/2018  Hearing? N N  Vision? N N  Difficulty concentrating or making decisions? N N  Walking or climbing stairs? N N  Dressing or bathing? N N  Doing errands, shopping? N N  Some recent data might be hidden     Cognitive Testing  Alert? Yes  Normal Appearance?Yes  Oriented to person? Yes  Place? Yes   Time? Yes  Recall of three objects?  Yes  Can perform simple calculations? Yes  Displays appropriate judgment?Yes  Can read the correct time from a watch face?Yes  EOL planning: Does Patient Have a Medical Advance Directive?: Yes Type of Advance Directive: Healthcare Power of Attorney, Living will Does patient want to make changes to medical advance directive?: No - Patient declined Copy of [place] in Chart?: No - copy requested  Review of Systems  Constitutional: Negative for malaise/fatigue and weight loss.  HENT:  Negative for hearing loss and tinnitus.   Eyes: Negative for blurred vision and double vision.  Respiratory: Negative for cough, shortness of breath and wheezing.   Cardiovascular: Negative for chest pain, palpitations, orthopnea, claudication and leg swelling.  Gastrointestinal: Negative for abdominal pain, blood in stool, constipation, diarrhea, heartburn, melena, nausea and vomiting.  Genitourinary: Negative.   Musculoskeletal: Negative for joint pain and myalgias.  Skin: Negative for rash.  Neurological: Negative for dizziness, tingling, sensory change, weakness and headaches.  Endo/Heme/Allergies: Negative for environmental allergies and polydipsia.  Psychiatric/Behavioral: Negative.  Negative for depression. The patient is not nervous/anxious and does not have insomnia.   All other systems reviewed and are negative.    Objective:     Today's Vitals   08/27/18 1000  BP: 114/64  Pulse: 69  Temp: (!) 97.5 F (36.4 C)  SpO2: 91%  Weight: 156 lb (70.8 kg)  Height: 5\' 2"  (1.575 m)   Body mass index is 28.53 kg/m.  General appearance: alert, no distress, WD/WN, female HEENT: normocephalic, sclerae anicteric, TMs pearly, nares patent, no discharge or erythema, pharynx normal Oral cavity: MMM, no lesions Neck: supple, no lymphadenopathy, no thyromegaly, no masses Heart: RRR, normal S1, S2, no murmurs Lungs: CTA bilaterally, no wheezes, rhonchi, or rales Abdomen: +bs, soft, non tender, non distended, no masses, no hepatomegaly, no splenomegaly Musculoskeletal: nontender, no swelling, no obvious deformity Extremities: no edema, no cyanosis, no clubbing Pulses: 2+ symmetric, upper and lower extremities, normal cap refill Neurological: alert, oriented x 3, CN2-12 intact, strength normal upper extremities and lower extremities, sensation normal throughout, DTRs 2+ throughout, no cerebellar signs, gait normal Psychiatric: normal affect, behavior normal, pleasant   Medicare  Attestation I have personally reviewed: The patient's medical and social history Their use of alcohol, tobacco or illicit drugs Their current medications and supplements The patient's functional ability including ADLs,fall risks, home safety risks, cognitive, and hearing and visual impairment Diet and physical activities Evidence for depression or mood disorders  The patient's weight, height, BMI, and visual acuity have been recorded in the chart.  I have made referrals, counseling, and provided education to the patient based on review of the above and I have provided the patient with a written personalized care plan for preventive services.     Izora Ribas, NP   08/27/2018

## 2018-08-27 ENCOUNTER — Encounter: Payer: Self-pay | Admitting: Adult Health

## 2018-08-27 ENCOUNTER — Ambulatory Visit (INDEPENDENT_AMBULATORY_CARE_PROVIDER_SITE_OTHER): Payer: Medicare Other | Admitting: Adult Health

## 2018-08-27 VITALS — BP 114/64 | HR 69 | Temp 97.5°F | Ht 62.0 in | Wt 156.0 lb

## 2018-08-27 DIAGNOSIS — Z0001 Encounter for general adult medical examination with abnormal findings: Secondary | ICD-10-CM

## 2018-08-27 DIAGNOSIS — R7303 Prediabetes: Secondary | ICD-10-CM | POA: Diagnosis not present

## 2018-08-27 DIAGNOSIS — I10 Essential (primary) hypertension: Secondary | ICD-10-CM

## 2018-08-27 DIAGNOSIS — E782 Mixed hyperlipidemia: Secondary | ICD-10-CM

## 2018-08-27 DIAGNOSIS — E039 Hypothyroidism, unspecified: Secondary | ICD-10-CM | POA: Diagnosis not present

## 2018-08-27 DIAGNOSIS — R6889 Other general symptoms and signs: Secondary | ICD-10-CM | POA: Diagnosis not present

## 2018-08-27 DIAGNOSIS — E2839 Other primary ovarian failure: Secondary | ICD-10-CM

## 2018-08-27 DIAGNOSIS — E663 Overweight: Secondary | ICD-10-CM

## 2018-08-27 DIAGNOSIS — E559 Vitamin D deficiency, unspecified: Secondary | ICD-10-CM

## 2018-08-27 DIAGNOSIS — Z0000 Encounter for general adult medical examination without abnormal findings: Secondary | ICD-10-CM

## 2018-08-27 DIAGNOSIS — Z79899 Other long term (current) drug therapy: Secondary | ICD-10-CM

## 2018-08-27 NOTE — Patient Instructions (Addendum)
Anna Caldwell , Thank you for taking time to come for your Medicare Wellness Visit. I appreciate your ongoing commitment to your health goals. Please review the following plan we discussed and let me know if I can assist you in the future.   These are the goals we discussed: Goals    . Exercise 150 min/wk Moderate Activity       This is a list of the screening recommended for you and due dates:  Health Maintenance  Topic Date Due  . Tetanus Vaccine  04/08/2024  . Flu Shot  Completed  . DEXA scan (bone density measurement)  Completed  . Pneumonia vaccines  Completed     also important to eat lots of  leafy green vegetables - spinach - Kale - collards - greens - okra - asparagus - broccoli - quinoa - squash - almonds - black, red, white beans-  peas - green beans  Hypomagnesemia Hypomagnesemia is a condition in which the level of magnesium in the blood is low. Magnesium is a mineral that is found in many foods. It is used in many different processes in the body. Hypomagnesemia can affect every organ in the body. In severe cases, it can cause life-threatening problems. What are the causes? This condition may be caused by:  Not getting enough magnesium in your diet.  Malnutrition.  Problems with absorbing magnesium from the intestines.  Dehydration.  Alcohol abuse.  Vomiting.  Severe or chronic diarrhea.  Some medicines, including medicines that make you urinate more (diuretics).  Certain diseases, such as kidney disease, diabetes, celiac disease, and overactive thyroid. What are the signs or symptoms? Symptoms of this condition include:  Loss of appetite.  Nausea and vomiting.  Involuntary shaking or trembling of a body part (tremor).  Muscle weakness.  Tingling in the arms and legs.  Sudden tightening of muscles (muscle spasms).  Confusion.  Psychiatric issues, such as depression, irritability, or psychosis.  A feeling of fluttering of the  heart.  Seizures. These symptoms are more severe if magnesium levels drop suddenly. How is this diagnosed? This condition may be diagnosed based on:  Your symptoms and medical history.  A physical exam.  Blood and urine tests. How is this treated? Treatment depends on the cause and the severity of the condition. It may be treated with:  A magnesium supplement. This can be taken in pill form. If the condition is severe, magnesium is usually given through an IV.  Changes to your diet. You may be directed to eat foods that have a lot of magnesium, such as green leafy vegetables, peas, beans, and nuts.  Stopping any intake of alcohol. Follow these instructions at home:      Make sure that your diet includes foods with magnesium. Foods that have a lot of magnesium in them include: ? Green leafy vegetables, such as spinach and broccoli. ? Beans and peas. ? Nuts and seeds, such as almonds and sunflower seeds. ? Whole grains, such as whole grain bread and fortified cereals.  Take magnesium supplements if your health care provider tells you to do that. Take them as directed.  Take over-the-counter and prescription medicines only as told by your health care provider.  Have your magnesium levels monitored as told by your health care provider.  When you are active, drink fluids that contain electrolytes.  Avoid drinking alcohol.  Keep all follow-up visits as told by your health care provider. This is important. Contact a health care provider if:  You get worse instead of better.  Your symptoms return. Get help right away if you:  Develop severe muscle weakness.  Have trouble breathing.  Feel that your heart is racing. Summary  Hypomagnesemia is a condition in which the level of magnesium in the blood is low.  Hypomagnesemia can affect every organ in the body.  Treatment may include eating more foods that contain magnesium, taking magnesium supplements, and not drinking  alcohol.  Have your magnesium levels monitored as told by your health care provider. This information is not intended to replace advice given to you by your health care provider. Make sure you discuss any questions you have with your health care provider. Document Released: 03/28/2005 Document Revised: 06/03/2017 Document Reviewed: 06/03/2017 Elsevier Interactive Patient Education  2019 [employer].

## 2018-08-28 LAB — TSH: TSH: 3.34 mIU/L (ref 0.40–4.50)

## 2018-08-28 LAB — COMPLETE METABOLIC PANEL WITH GFR
AG RATIO: 1.5 (calc) (ref 1.0–2.5)
ALT: 16 U/L (ref 6–29)
AST: 17 U/L (ref 10–35)
Albumin: 4 g/dL (ref 3.6–5.1)
Alkaline phosphatase (APISO): 78 U/L (ref 37–153)
BUN: 14 mg/dL (ref 7–25)
CO2: 31 mmol/L (ref 20–32)
Calcium: 9.6 mg/dL (ref 8.6–10.4)
Chloride: 106 mmol/L (ref 98–110)
Creat: 0.77 mg/dL (ref 0.60–0.93)
GFR, Est African American: 87 mL/min/{1.73_m2} (ref >=60)
GFR, Est Non African American: 75 mL/min/{1.73_m2} (ref >=60)
Globulin: 2.6 g/dL (calc) (ref 1.9–3.7)
Glucose, Bld: 91 mg/dL (ref 65–99)
POTASSIUM: 4.3 mmol/L (ref 3.5–5.3)
Sodium: 142 mmol/L (ref 135–146)
Total Bilirubin: 0.3 mg/dL (ref 0.2–1.2)
Total Protein: 6.6 g/dL (ref 6.1–8.1)

## 2018-08-28 LAB — CBC WITH DIFFERENTIAL/PLATELET
Absolute Monocytes: 739 cells/uL (ref 200–950)
Basophils Absolute: 77 cells/uL (ref 0–200)
Basophils Relative: 1 %
Eosinophils Absolute: 270 cells/uL (ref 15–500)
Eosinophils Relative: 3.5 %
HCT: 42.5 % (ref 35.0–45.0)
Hemoglobin: 14.3 g/dL (ref 11.7–15.5)
Lymphs Abs: 2402 cells/uL (ref 850–3900)
MCH: 27.9 pg (ref 27.0–33.0)
MCHC: 33.6 g/dL (ref 32.0–36.0)
MCV: 83 fL (ref 80.0–100.0)
MPV: 10.1 fL (ref 7.5–12.5)
Monocytes Relative: 9.6 %
Neutro Abs: 4212 cells/uL (ref 1500–7800)
Neutrophils Relative %: 54.7 %
Platelets: 284 10*3/uL (ref 140–400)
RBC: 5.12 10*6/uL — AB (ref 3.80–5.10)
RDW: 13.3 % (ref 11.0–15.0)
Total Lymphocyte: 31.2 %
WBC: 7.7 10*3/uL (ref 3.8–10.8)

## 2018-08-28 LAB — HEMOGLOBIN A1C
Hgb A1c MFr Bld: 5.9 % of total Hgb — ABNORMAL HIGH (ref <5.7)
MEAN PLASMA GLUCOSE: 123 (calc)
eAG (mmol/L): 6.8 (calc)

## 2018-08-28 LAB — LIPID PANEL
Cholesterol: 171 mg/dL (ref <200)
HDL: 37 mg/dL — ABNORMAL LOW (ref >=50)
LDL Cholesterol (Calc): 100 mg/dL (calc) — ABNORMAL HIGH
Non-HDL Cholesterol (Calc): 134 mg/dL (calc) — ABNORMAL HIGH (ref <130)
Total CHOL/HDL Ratio: 4.6 (calc) (ref <5.0)
Triglycerides: 215 mg/dL — ABNORMAL HIGH (ref <150)

## 2018-08-29 DIAGNOSIS — Z85828 Personal history of other malignant neoplasm of skin: Secondary | ICD-10-CM | POA: Diagnosis not present

## 2018-08-29 DIAGNOSIS — R21 Rash and other nonspecific skin eruption: Secondary | ICD-10-CM | POA: Diagnosis not present

## 2018-08-29 DIAGNOSIS — L308 Other specified dermatitis: Secondary | ICD-10-CM | POA: Diagnosis not present

## 2018-08-31 ENCOUNTER — Other Ambulatory Visit: Payer: Self-pay | Admitting: Adult Health

## 2018-12-04 ENCOUNTER — Encounter: Payer: Self-pay | Admitting: Internal Medicine

## 2018-12-04 NOTE — Progress Notes (Signed)
THIS ENCOUNTER IS A VIRTUAL VISIT DUE TO COVID-19 - PATIENT WAS NOT SEEN IN THE OFFICE.  PATIENT HAS CONSENTED TO VIRTUAL VISIT / TELEMEDICINE VISIT  This provider placed a call to [place] using telephone, her appointment was changed to a virtual office visit to reduce the risk of exposure to the COVID-19 virus and to help [place] remain healthy and safe. The virtual visit will also provide continuity of care. She verbalizes understanding.   Virtual Visit via telephone Note  I connected with  Anna Caldwell  on 12/04/18  by telephone.  I verified that I am speaking with the correct person using two identifiers.        I discussed the limitations of evaluation and management by telemedicine and the availability of in person appointments. The patient expressed understanding and agreed to proceed.  History of Present Illness:      This very nice 78 y.o. presents for 6 month follow up with HTN, HLD, Pre-Diabetes, Hypothyroidism and Vitamin D Deficiency. Patient has hx/o GERD controlled with diet.      Patient is treated for HTN (2008) & BP has been controlled at home. Today's  . Patient has had no complaints of any cardiac type chest pain, palpitations, dyspnea / orthopnea / PND, dizziness, claudication, or dependent edema.      Hyperlipidemia is not  controlled with diet.   Last Lipids were borderline high LDL and with elevated Trig's: Lab Results  Component Value Date   CHOL 171 08/27/2018   HDL 37 (L) 08/27/2018   LDLCALC 100 (H) 08/27/2018   TRIG 215 (H) 08/27/2018   CHOLHDL 4.6 08/27/2018       Also, the patient has history of PreDiabetes  (A1c 6.2% / 2011 & 6.0% / 2016)  and has had no symptoms of reactive hypoglycemia, diabetic polys, paresthesias or visual blurring.  Last A1c was near goal: Lab Results  Component Value Date   HGBA1C 5.9 (H) 08/27/2018   Wt Readings from Last 3 Encounters:  08/27/18 156 lb (70.8 kg)  05/22/18 156 lb 12.8 oz  (71.1 kg)  11/20/17 158 lb (71.7 kg)      Patient was initiated on thyroid meds in 2014 and relates no sx's of excess or deficiency.     Further, the patient also has history of Vitamin D Deficiency ("13" / 2008)  and supplements vitamin D without any suspected side-effects. Last vitamin D was at goal: Lab Results  Component Value Date   VD25OH 59 05/22/2018   Current Outpatient Medications on File Prior to Visit  Medication Sig  . aspirin 81 MG chewable tablet Chew by mouth daily.  . Cholecalciferol (VITAMIN D PO) Take 10,000 Int'l Units by mouth daily.  . fluocinonide cream (LIDEX) 0.05 % as needed.   . IRON, FERROUS SULFATE, PO Take by mouth.  Javier Docker Oil 1000 MG CAPS Take by mouth daily.   Marland Kitchen levothyroxine (SYNTHROID, LEVOTHROID) 50 MCG tablet TAKE 1/2 TO 1 TABLET BY MOUTH EVERY DAY AS DIRECTED  . OVER THE COUNTER MEDICATION daily. [facility] brand allergy pill   No current facility-administered medications on file prior to visit.    No Known Allergies   PMHx:   Past Medical History:  Diagnosis Date  . Arthritis   . Hyperlipidemia   . Hypertension   . Prediabetes   . Thyroid disease   . Vitamin D deficiency    Immunization History  Administered Date(s) Administered  . DT 04/08/2014  .  Influenza, High Dose Seasonal PF 04/18/2015, 05/15/2016, 05/22/2017, 06/20/2018  . Pneumococcal Conjugate-13 05/15/2016  . Pneumococcal Polysaccharide-23 11/18/2006, 04/08/2014  . Td 07/16/2002  . Zoster 01/29/2007   Past Surgical History:  Procedure Laterality Date  . APPENDECTOMY    . OTHER SURGICAL HISTORY  1981   BTL   . TONSILLECTOMY     FHx:    Reviewed / unchanged  SHx:    Reviewed / unchanged   Systems Review:  Constitutional: Denies fever, chills, wt changes, headaches, insomnia, fatigue, night sweats, change in appetite. Eyes: Denies redness, blurred vision, diplopia, discharge, itchy, watery eyes.  ENT: Denies discharge, congestion, post nasal drip, epistaxis, sore  throat, earache, hearing loss, dental pain, tinnitus, vertigo, sinus pain, snoring.  CV: Denies chest pain, palpitations, irregular heartbeat, syncope, dyspnea, diaphoresis, orthopnea, PND, claudication or edema. Respiratory: denies cough, dyspnea, DOE, pleurisy, hoarseness, laryngitis, wheezing.  Gastrointestinal: Denies dysphagia, odynophagia, heartburn, reflux, water brash, abdominal pain or cramps, nausea, vomiting, bloating, diarrhea, constipation, hematemesis, melena, hematochezia  or hemorrhoids. Genitourinary: Denies dysuria, frequency, urgency, nocturia, hesitancy, discharge, hematuria or flank pain. Musculoskeletal: Denies arthralgias, myalgias, stiffness, jt. swelling, pain, limping or strain/sprain.  Skin: Denies pruritus, rash, hives, warts, acne, eczema or change in skin lesion(s). Neuro: No weakness, tremor, incoordination, spasms, paresthesia or pain. Psychiatric: Denies confusion, memory loss or sensory loss. Endo: Denies change in weight, skin or hair change.  Heme/Lymph: No excessive bleeding, bruising or enlarged lymph nodes.  Physical Exam  General : Well sounding patient in no apparent distress HEENT: no hoarseness, no cough for duration of visit Lungs: speaks in complete sentences, no audible wheezing, no apparent distress Neurological: alert, oriented x 3 Psychiatric: pleasant, judgement appropriate   Assessment and Plan:  1. Hyperlipidemia, mixed  - Continue medication, monitor blood pressure at home.  - Continue DASH diet.  Reminder to go to the ER if any CP,  SOB, nausea, dizziness, severe HA, changes vision/speech.  2. Essential hypertension  - Continue diet/meds, exercise,& lifestyle modifications.  - Continue monitor periodic cholesterol/liver & renal functions   3. Abnormal glucose  - Continue diet, exercise  - Lifestyle modifications.  - Monitor appropriate labs.  4. Vitamin D deficiency  - Continue supplementation.  5. Prediabetes  6.  Hypothyroidism       Discussed  regular exercise, BP monitoring, weight control to achieve/maintain BMI less than 25 and discussed med and SE's. Deferred  labs til next office visit.  Reviewed AVS with patient. I discussed the assessment and treatment plan with the patient. The patient was provided an opportunity to ask questions and all were answered. The patient agreed with the plan and demonstrated an understanding of the instructions. I provided 18 2minutes of non-face-to-face time during this encounter and over 24 minutes of  Virtual exam, counseling, chart review and critical decision making was performed   Kirtland Bouchard, MD

## 2018-12-04 NOTE — Patient Instructions (Addendum)

## 2018-12-05 ENCOUNTER — Ambulatory Visit: Payer: Medicare Other | Admitting: Internal Medicine

## 2018-12-05 DIAGNOSIS — E782 Mixed hyperlipidemia: Secondary | ICD-10-CM

## 2018-12-05 DIAGNOSIS — R7309 Other abnormal glucose: Secondary | ICD-10-CM | POA: Diagnosis not present

## 2018-12-05 DIAGNOSIS — E559 Vitamin D deficiency, unspecified: Secondary | ICD-10-CM | POA: Diagnosis not present

## 2018-12-05 DIAGNOSIS — R7303 Prediabetes: Secondary | ICD-10-CM | POA: Diagnosis not present

## 2018-12-05 DIAGNOSIS — I10 Essential (primary) hypertension: Secondary | ICD-10-CM

## 2018-12-05 DIAGNOSIS — Z79899 Other long term (current) drug therapy: Secondary | ICD-10-CM

## 2018-12-05 DIAGNOSIS — E039 Hypothyroidism, unspecified: Secondary | ICD-10-CM

## 2019-01-30 DIAGNOSIS — L308 Other specified dermatitis: Secondary | ICD-10-CM | POA: Diagnosis not present

## 2019-01-30 DIAGNOSIS — Z85828 Personal history of other malignant neoplasm of skin: Secondary | ICD-10-CM | POA: Diagnosis not present

## 2019-03-05 NOTE — Progress Notes (Signed)
FOLLOW UP  Assessment and Plan:   Hypertension Fairly controlled by lifestyle Monitor blood pressure at home; patient to call if consistently greater than 130/80 Continue DASH diet.   Reminder to go to the ER if any CP, SOB, nausea, dizziness, severe HA, changes vision/speech, left arm numbness and tingling and jaw pain.  Cholesterol Currently at goal by lifestyle modification Continue low cholesterol diet and exercise.  Check lipid panel.   Other abnormal glucose Recent A1Cs at goal Discussed diet/exercise, weight management  Check 6 month A1C  BMI 29 Continue to recommend diet heavy in fruits and veggies and low in animal meats, cheeses, and dairy products, appropriate calorie intake Diet discussed at length  Hypothyroidism continue medications the same pending lab results reminded to take on an empty stomach 30-48mins before food.  check TSH level  Vitamin D Def Continue supplementation; near goal at last check Check Vit D level  Continue diet and meds as discussed. Further disposition pending results of labs. Discussed med's effects and SE's.   Over 30 minutes of exam, counseling, chart review, and critical decision making was performed.   Future Appointments  Date Time Provider [place]  05/13/2019 10:30 AM [facility] DX DEXA 1 [facility]  06/17/2019  3:00 PM Unk Pinto, MD [facility] None  09/04/2019  9:30 AM Liane Comber, NP [facility] None    ----------------------------------------------------------------------------------------------------------------------  HPI 78 y.o. female  presents for 3 month follow up on hypertension, cholesterol, glucose management, weight and vitamin D deficiency.   BMI is Body mass index is 28.72 kg/m., she has not been working on diet and exercise, but plans to restart exercise.  Wt Readings from Last 3 Encounters:  03/11/19 157 lb (71.2 kg)  08/27/18 156 lb (70.8 kg)  05/22/18 156 lb 12.8 oz (71.1 kg)    Today their BP is BP: 140/76  She does not workout. She denies chest pain, shortness of breath, dizziness.   She is not on cholesterol medication and denies myalgias. Her cholesterol is at goal. The cholesterol last visit was:   Lab Results  Component Value Date   CHOL 171 08/27/2018   HDL 37 (L) 08/27/2018   LDLCALC 100 (H) 08/27/2018   TRIG 215 (H) 08/27/2018   CHOLHDL 4.6 08/27/2018    She has not been working on diet and exercise for glucose mangement (hx of prediabetes), and denies foot ulcerations, increased appetite, nausea, paresthesia of the feet, polydipsia, polyuria, visual disturbances, vomiting and weight loss. Last A1C in the office was:  Lab Results  Component Value Date   HGBA1C 5.9 (H) 08/27/2018   She is on thyroid medication. Her medication was not changed last visit.  Takes 1/2 daily.  Lab Results  Component Value Date   TSH 3.34 08/27/2018  .  Patient is on Vitamin D supplement.   Lab Results  Component Value Date   VD25OH 59 05/22/2018        Current Medications:  Current Outpatient Medications on File Prior to Visit  Medication Sig  . aspirin 81 MG chewable tablet Chew by mouth daily.  . Cholecalciferol (VITAMIN D PO) Take 10,000 Int'l Units by mouth daily.  . Ferrous Sulfate (IRON PO) Take by mouth.  . fluocinonide cream (LIDEX) 0.05 % as needed.   . IRON, FERROUS SULFATE, PO Take by mouth.  Javier Docker Oil 1000 MG CAPS Take by mouth daily.   Marland Kitchen levothyroxine (SYNTHROID, LEVOTHROID) 50 MCG tablet TAKE 1/2 TO 1 TABLET BY MOUTH EVERY DAY AS DIRECTED  .  OVER THE COUNTER MEDICATION daily. [facility] brand allergy pill  . zinc gluconate 50 MG tablet Take 50 mg by mouth daily.   No current facility-administered medications on file prior to visit.      Allergies: No Known Allergies   Medical History:  Past Medical History:  Diagnosis Date  . Arthritis   . Hyperlipidemia   . Hypertension   . Prediabetes   . Thyroid disease   . Vitamin D deficiency     Family history- Reviewed and unchanged Social history- Reviewed and unchanged   Review of Systems:  Review of Systems  Constitutional: Negative for malaise/fatigue and weight loss.  HENT: Negative for hearing loss and tinnitus.   Eyes: Negative for blurred vision and double vision.  Respiratory: Negative for cough, shortness of breath and wheezing.   Cardiovascular: Negative for chest pain, palpitations, orthopnea, claudication and leg swelling.  Gastrointestinal: Negative for abdominal pain, blood in stool, constipation, diarrhea, heartburn, melena, nausea and vomiting.  Genitourinary: Negative.   Musculoskeletal: Negative for joint pain and myalgias.  Skin: Negative for rash.  Neurological: Negative for dizziness, tingling, sensory change, weakness and headaches.  Endo/Heme/Allergies: Negative for polydipsia.  Psychiatric/Behavioral: Negative.   All other systems reviewed and are negative.     Physical Exam: BP 140/76   Pulse 67   Temp (!) 97.3 F (36.3 C)   Wt 157 lb (71.2 kg)   SpO2 97%   BMI 28.72 kg/m  Wt Readings from Last 3 Encounters:  03/11/19 157 lb (71.2 kg)  08/27/18 156 lb (70.8 kg)  05/22/18 156 lb 12.8 oz (71.1 kg)   General Appearance: Well nourished, in no apparent distress. Eyes: PERRLA, EOMs, conjunctiva no swelling or erythema Sinuses: No Frontal/maxillary tenderness ENT/Mouth: Ext aud canals clear, TMs without erythema, bulging. No erythema, swelling, or exudate on post pharynx.  Tonsils not swollen or erythematous. Hearing normal.  Neck: Supple, thyroid normal.  Respiratory: Respiratory effort normal, BS equal bilaterally without rales, rhonchi, wheezing or stridor.  Cardio: RRR with 2-3 systolic murmur with radiation to carotids. Brisk peripheral pulses without edema.  Abdomen: Soft, + BS.  Non tender, no guarding, rebound, hernias, masses. Lymphatics: Non tender without lymphadenopathy.  Musculoskeletal: Full ROM, 5/5 strength, Normal  gait Skin: Warm, dry without rashes, lesions, ecchymosis.  Neuro: Cranial nerves intact. No cerebellar symptoms.  Psych: Awake and oriented X 3, normal affect, Insight and Judgment appropriate.    Vicie Mutters, PA-C 11:31 AM [facility]

## 2019-03-11 ENCOUNTER — Encounter: Payer: Self-pay | Admitting: Physician Assistant

## 2019-03-11 ENCOUNTER — Other Ambulatory Visit: Payer: Self-pay

## 2019-03-11 ENCOUNTER — Ambulatory Visit (INDEPENDENT_AMBULATORY_CARE_PROVIDER_SITE_OTHER): Payer: Medicare Other | Admitting: Physician Assistant

## 2019-03-11 VITALS — BP 140/76 | HR 67 | Temp 97.3°F | Wt 157.0 lb

## 2019-03-11 DIAGNOSIS — R7309 Other abnormal glucose: Secondary | ICD-10-CM

## 2019-03-11 DIAGNOSIS — E782 Mixed hyperlipidemia: Secondary | ICD-10-CM | POA: Diagnosis not present

## 2019-03-11 DIAGNOSIS — Z79899 Other long term (current) drug therapy: Secondary | ICD-10-CM | POA: Diagnosis not present

## 2019-03-11 DIAGNOSIS — I10 Essential (primary) hypertension: Secondary | ICD-10-CM

## 2019-03-11 DIAGNOSIS — E039 Hypothyroidism, unspecified: Secondary | ICD-10-CM

## 2019-03-11 DIAGNOSIS — E559 Vitamin D deficiency, unspecified: Secondary | ICD-10-CM | POA: Diagnosis not present

## 2019-03-11 NOTE — Patient Instructions (Addendum)
HYPERTENSION INFORMATION  Monitor your blood pressure at home, please keep a record and bring that in with you to your next office visit.   Go to the ER if any CP, SOB, nausea, dizziness, severe HA, changes vision/speech  Testing/Procedures: HOW TO TAKE YOUR BLOOD PRESSURE:  Rest 5 minutes before taking your blood pressure.  Dont smoke or drink caffeinated beverages for at least 30 minutes before.  Take your blood pressure before (not after) you eat.  Sit comfortably with your back supported and both feet on the floor (dont cross your legs).  Elevate your arm to heart level on a table or a desk.  Use the proper sized cuff. It should fit smoothly and snugly around your bare upper arm. There should be enough room to slip a fingertip under the cuff. The bottom edge of the cuff should be 1 inch above the crease of the elbow.  Due to a recent study, SPRINT, we have changed our goal for the systolic or top blood pressure number. Ideally we want your top number at 120.  In the [facility] Trial, 5000 people were randomized to a goal BP of 120 and 5000 people were randomized to a goal BP of less than 140. The patients with the goal BP at 120 had LESS DEMENTIA, LESS HEART ATTACKS, AND LESS STROKES, AS WELL AS OVERALL DECREASED MORTALITY OR DEATH RATE.   There was another study that showed taking your blood pressure medications at night decrease cardiovascular events.  However if you are on a fluid pill, please take this in the morning.   If you are willing, our goal BP is the top number of 120.  Your most recent BP: BP: 140/76   Take your medications faithfully as instructed. Maintain a healthy weight. Get at least 150 minutes of aerobic exercise per week. Minimize salt intake. Minimize alcohol intake  DASH Eating Plan DASH stands for "Dietary Approaches to Stop Hypertension." The DASH eating plan is a healthy eating plan that has been shown to reduce high blood pressure (hypertension).  Additional health benefits may include reducing the risk of type 2 diabetes mellitus, heart disease, and stroke. The DASH eating plan may also help with weight loss. WHAT DO I NEED TO KNOW ABOUT THE DASH EATING PLAN? For the DASH eating plan, you will follow these general guidelines:  Choose foods with a percent daily value for sodium of less than 5% (as listed on the food label).  Use salt-free seasonings or herbs instead of table salt or sea salt.  Check with your health care provider or pharmacist before using salt substitutes.  Eat lower-sodium products, often labeled as "lower sodium" or "no salt added."  Eat fresh foods.  Eat more vegetables, fruits, and low-fat dairy products.  Choose whole grains. Look for the word "whole" as the first word in the ingredient list.  Choose fish and skinless chicken or [place] more often than red meat. Limit fish, poultry, and meat to 6 oz (170 g) each day.  Limit sweets, desserts, sugars, and sugary drinks.  Choose heart-healthy fats.  Limit cheese to 1 oz (28 g) per day.  Eat more home-cooked food and less restaurant, buffet, and fast food.  Limit fried foods.  Cook foods using methods other than frying.  Limit canned vegetables. If you do use them, rinse them well to decrease the sodium.  When eating at a restaurant, ask that your food be prepared with less salt, or no salt if possible. WHAT FOODS CAN  I EAT? Seek help from a dietitian for individual calorie needs. Grains Whole grain or whole wheat bread. Brown rice. Whole grain or whole wheat pasta. Quinoa, bulgur, and whole grain cereals. Low-sodium cereals. Corn or whole wheat flour tortillas. Whole grain cornbread. Whole grain crackers. Low-sodium crackers. Vegetables Fresh or frozen vegetables (raw, steamed, roasted, or grilled). Low-sodium or reduced-sodium tomato and vegetable juices. Low-sodium or reduced-sodium tomato sauce and paste. Low-sodium or reduced-sodium canned  vegetables.  Fruits All fresh, canned (in natural juice), or frozen fruits. Meat and Other Protein Products Ground beef (85% or leaner), grass-fed beef, or beef trimmed of fat. Skinless chicken or [place]. Ground chicken or [place]. Pork trimmed of fat. All fish and seafood. Eggs. Dried beans, peas, or lentils. Unsalted nuts and seeds. Unsalted canned beans. Dairy Low-fat dairy products, such as skim or 1% milk, 2% or reduced-fat cheeses, low-fat ricotta or cottage cheese, or plain low-fat yogurt. Low-sodium or reduced-sodium cheeses. Fats and Oils Tub margarines without trans fats. Light or reduced-fat mayonnaise and salad dressings (reduced sodium). Avocado. Safflower, olive, or canola oils. Natural peanut or almond butter. Other Unsalted popcorn and pretzels. The items listed above may not be a complete list of recommended foods or beverages. Contact your dietitian for more options. WHAT FOODS ARE NOT RECOMMENDED? Grains White bread. White pasta. White rice. Refined cornbread. Bagels and croissants. Crackers that contain trans fat. Vegetables Creamed or fried vegetables. Vegetables in a cheese sauce. Regular canned vegetables. Regular canned tomato sauce and paste. Regular tomato and vegetable juices. Fruits Dried fruits. Canned fruit in light or heavy syrup. Fruit juice. Meat and Other Protein Products Fatty cuts of meat. Ribs, chicken wings, bacon, sausage, bologna, salami, chitterlings, fatback, hot dogs, bratwurst, and packaged luncheon meats. Salted nuts and seeds. Canned beans with salt. Dairy Whole or 2% milk, cream, half-and-half, and cream cheese. Whole-fat or sweetened yogurt. Full-fat cheeses or blue cheese. Nondairy creamers and whipped toppings. Processed cheese, cheese spreads, or cheese curds. Condiments Onion and garlic salt, seasoned salt, table salt, and sea salt. Canned and packaged gravies. Worcestershire sauce. Tartar sauce. Barbecue sauce. Teriyaki sauce. Soy sauce,  including reduced sodium. Steak sauce. Fish sauce. Oyster sauce. Cocktail sauce. Horseradish. Ketchup and mustard. Meat flavorings and tenderizers. Bouillon cubes. Hot sauce. Tabasco sauce. Marinades. Taco seasonings. Relishes. Fats and Oils Butter, stick margarine, lard, shortening, ghee, and bacon fat. Coconut, palm kernel, or palm oils. Regular salad dressings. Other Pickles and olives. Salted popcorn and pretzels. The items listed above may not be a complete list of foods and beverages to avoid. Contact your dietitian for more information. WHERE CAN I FIND MORE INFORMATION? National Heart, Lung, and Blood Institute: travelstabloid.com Document Released: 06/21/2011 Document Revised: 11/16/2013 Document Reviewed: 05/06/2013 [facility] Patient Information 2015 [place], [place]. This information is not intended to replace advice given to you by your health care provider. Make sure you discuss any questions you have with your health care provider.      Insulin Resistance  Insulin is a hormone that helps to control blood sugar (glucose) levels in the body. It is made in the pancreas. Insulin allows glucose to enter cells in the body. Insulin sensitivity refers to how the body responds to insulin. Insulin resistance occurs when cells in the body do not respond properly to insulin made by the pancreas and are not able to absorb glucose from the bloodstream. Insulin resistance results in high blood glucose levels (hyperglycemia) and can lead to problems, including:  Prediabetes.  Type 2 diabetes (type  2 diabetes mellitus).  Heart disease.  High blood pressure (hypertension).  Stroke.  Polycystic ovary syndrome (PCOS).  Nonalcoholic fatty liver disease. What are the causes? The exact cause of insulin resistance is not known. What increases the risk? The following factors may make you more likely to develop insulin resistance:  Being overweight or obese,  especially if a lot of your weight is in your waist area.  Having an inactive (sedentary) lifestyle.  Using steroids.  Being older than age 68.  Having sleep apnea.  Using tobacco products. What are the signs or symptoms? This condition usually does not cause symptoms. How is this diagnosed? There is no test to diagnose insulin resistance. However, your health care provider may diagnose insulin resistance based on:  Your blood glucose levels.  Your cholesterol levels.  A measurement of the distance around your waist (circumference). A waist circumference of more than 35 inches (88.9 cm) for women and more than 40 inches (101.6 cm) for men may be a sign of insulin resistance.  Your risk factors.  A physical exam.  Your medical history. How is this treated? Insulin resistance is treated with nutrition and lifestyle changes. These changes may include:  Eating a healthy balance of nutritious foods.  Getting more physical activity.  Maintaining a healthy weight.  Stopping the use of any tobacco products. Your health care provider will work with you to change your nutrition and lifestyle as needed. In some cases, treatment may also include medicine to improve your insulin sensitivity. Follow these instructions at home: Activity  Be physically active. Do moderate-intensity physical activity for 30 minutes or more on 5 or more days of the week, or as much as told by your health care provider. This could be brisk walking, biking, or water aerobics.  Ask your health care provider what activities are safe for you. A mix of physical activities may be best, such as walking, swimming, cycling, and strength training. Eating and drinking   Follow a healthy meal plan. This includes eating lean proteins, complex carbohydrates, fresh fruits and vegetables, low-fat dairy products, and healthy fats.  Follow instructions from your health care provider about eating or drinking  restrictions.  Make an appointment to see a diet and nutrition specialist (registered dietitian) to help you create a healthy eating plan. General instructions  Check your blood glucose levels as told by your health care provider.  Take over-the-counter and prescription medicines only as told by your health care provider.  Lose weight as told by your health care provider. ? Losing 5-7% of your body weight can reverse insulin resistance. ? Your health care provider can determine how much weight loss is best for you and can help you lose weight safely.  Do not use any tobacco products, such as cigarettes, chewing tobacco, and e-cigarettes. If you need help quitting, ask your health care provider.  Keep all follow-up visits as told by your health care provider. This is important. Contact a health care provider if:  You have trouble losing weight or maintaining your goal weight.  You gain weight.  You have trouble following your prescribed meal plan.  You have trouble exercising more. Summary  Insulin resistance occurs when cells in the body do not respond properly to insulin made by the pancreas and are not able to absorb blood sugar (glucose) from the bloodstream.  Your health care provider will work with you to change your nutrition and lifestyle as needed. Treatment may also include medicine to improve your  insulin sensitivity.  Keep all follow-up visits as told by your health care provider. This is important. This information is not intended to replace advice given to you by your health care provider. Make sure you discuss any questions you have with your health care provider. Document Released: 08/21/2005 Document Revised: 06/14/2017 Document Reviewed: 08/05/2015 Elsevier Patient Education  2020 [employer].

## 2019-03-12 LAB — COMPLETE METABOLIC PANEL WITH GFR
AG Ratio: 1.7 (calc) (ref 1.0–2.5)
ALT: 25 U/L (ref 6–29)
AST: 21 U/L (ref 10–35)
Albumin: 4.3 g/dL (ref 3.6–5.1)
Alkaline phosphatase (APISO): 87 U/L (ref 37–153)
BUN: 14 mg/dL (ref 7–25)
CO2: 28 mmol/L (ref 20–32)
Calcium: 9.8 mg/dL (ref 8.6–10.4)
Chloride: 107 mmol/L (ref 98–110)
Creat: 0.69 mg/dL (ref 0.60–0.93)
GFR, Est African American: 97 mL/min/{1.73_m2} (ref >=60)
GFR, Est Non African American: 84 mL/min/{1.73_m2} (ref >=60)
Globulin: 2.5 g/dL (calc) (ref 1.9–3.7)
Glucose, Bld: 112 mg/dL — ABNORMAL HIGH (ref 65–99)
Potassium: 4.2 mmol/L (ref 3.5–5.3)
Sodium: 141 mmol/L (ref 135–146)
Total Bilirubin: 0.4 mg/dL (ref 0.2–1.2)
Total Protein: 6.8 g/dL (ref 6.1–8.1)

## 2019-03-12 LAB — CBC WITH DIFFERENTIAL/PLATELET
Absolute Monocytes: 714 cells/uL (ref 200–950)
Basophils Absolute: 83 cells/uL (ref 0–200)
Basophils Relative: 1 %
Eosinophils Absolute: 232 cells/uL (ref 15–500)
Eosinophils Relative: 2.8 %
HCT: 42.7 % (ref 35.0–45.0)
Hemoglobin: 14.2 g/dL (ref 11.7–15.5)
Lymphs Abs: 2341 cells/uL (ref 850–3900)
MCH: 27.9 pg (ref 27.0–33.0)
MCHC: 33.3 g/dL (ref 32.0–36.0)
MCV: 83.9 fL (ref 80.0–100.0)
MPV: 10.1 fL (ref 7.5–12.5)
Monocytes Relative: 8.6 %
Neutro Abs: 4930 cells/uL (ref 1500–7800)
Neutrophils Relative %: 59.4 %
Platelets: 299 10*3/uL (ref 140–400)
RBC: 5.09 10*6/uL (ref 3.80–5.10)
RDW: 13.4 % (ref 11.0–15.0)
Total Lymphocyte: 28.2 %
WBC: 8.3 10*3/uL (ref 3.8–10.8)

## 2019-03-12 LAB — LIPID PANEL
Cholesterol: 186 mg/dL (ref <200)
HDL: 43 mg/dL — ABNORMAL LOW (ref >=50)
LDL Cholesterol (Calc): 114 mg/dL (calc) — ABNORMAL HIGH
Non-HDL Cholesterol (Calc): 143 mg/dL (calc) — ABNORMAL HIGH (ref <130)
Total CHOL/HDL Ratio: 4.3 (calc) (ref <5.0)
Triglycerides: 172 mg/dL — ABNORMAL HIGH (ref <150)

## 2019-03-12 LAB — MAGNESIUM: Magnesium: 1.9 mg/dL (ref 1.5–2.5)

## 2019-03-12 LAB — HEMOGLOBIN A1C
Hgb A1c MFr Bld: 5.9 % of total Hgb — ABNORMAL HIGH (ref <5.7)
Mean Plasma Glucose: 123 (calc)
eAG (mmol/L): 6.8 (calc)

## 2019-03-12 LAB — VITAMIN D 25 HYDROXY (VIT D DEFICIENCY, FRACTURES): Vit D, 25-Hydroxy: 72 ng/mL (ref 30–100)

## 2019-03-12 LAB — TSH: TSH: 3.67 mIU/L (ref 0.40–4.50)

## 2019-04-21 DIAGNOSIS — Z23 Encounter for immunization: Secondary | ICD-10-CM | POA: Diagnosis not present

## 2019-04-24 DIAGNOSIS — H524 Presbyopia: Secondary | ICD-10-CM | POA: Diagnosis not present

## 2019-04-24 DIAGNOSIS — H2513 Age-related nuclear cataract, bilateral: Secondary | ICD-10-CM | POA: Diagnosis not present

## 2019-05-13 ENCOUNTER — Encounter: Payer: Self-pay | Admitting: Adult Health

## 2019-05-13 ENCOUNTER — Ambulatory Visit
Admission: RE | Admit: 2019-05-13 | Discharge: 2019-05-13 | Disposition: A | Discharge status: Home / Self Care | Payer: Medicare Other | Source: Ambulatory Visit | Attending: Adult Health | Admitting: Adult Health

## 2019-05-13 ENCOUNTER — Other Ambulatory Visit: Payer: Self-pay

## 2019-05-13 DIAGNOSIS — E2839 Other primary ovarian failure: Secondary | ICD-10-CM

## 2019-05-13 DIAGNOSIS — M8589 Other specified disorders of bone density and structure, multiple sites: Secondary | ICD-10-CM | POA: Diagnosis not present

## 2019-05-13 DIAGNOSIS — M8580 Other specified disorders of bone density and structure, unspecified site: Secondary | ICD-10-CM | POA: Insufficient documentation

## 2019-05-13 DIAGNOSIS — Z780 Asymptomatic menopausal state: Secondary | ICD-10-CM | POA: Diagnosis not present

## 2019-06-08 ENCOUNTER — Encounter: Payer: Self-pay | Admitting: Internal Medicine

## 2019-06-16 ENCOUNTER — Encounter: Payer: Self-pay | Admitting: Internal Medicine

## 2019-06-16 NOTE — Progress Notes (Signed)
Comprehensive Evaluation &  Examination     This very nice 78 y.o.  WWF presents for a comprehensive evaluation and management of multiple medical co-morbidities.  Patient has been followed for HTN, HLD, Hypothyroidism, Prediabetes  and Vitamin D Deficiency. Patient has hx/o GERD controlled with her diet & OTC antacids.       Labile HTN predates  Circa 2008. Patient's BP has been controlled at home and patient denies any cardiac symptoms as chest pain, palpitations, shortness of breath, dizziness or ankle swelling. Today's BP is at goal -  124/66.      Patient's hyperlipidemia is not controlled with diet. Last lipids were not at goal:  Lab Results  Component Value Date   CHOL 186 03/11/2019   HDL 43 (L) 03/11/2019   LDLCALC 114 (H) 03/11/2019   TRIG 172 (H) 03/11/2019   CHOLHDL 4.3 03/11/2019       Patient has hx/o prediabetes (A1c 6.2% / 2011)  and patient denies reactive hypoglycemic symptoms, visual blurring, diabetic polys or paresthesias. Last A1c was not at goal:  Lab Results  Component Value Date   HGBA1C 5.9 (H) 03/11/2019       Patient was dx'd Hypothyroid in 2014 & initiated on thyroid replacement.      Finally, patient has history of Vitamin D Deficiency ("13" / 2008)  and last Vitamin D was at goal:  Lab Results  Component Value Date   VD25OH 72 03/11/2019   Current Outpatient Medications on File Prior to Visit  Medication Sig  . aspirin 81 MG chewable tablet Chew by mouth daily.  . Cholecalciferol (VITAMIN D PO) Take 10,000 Int'l Units by mouth daily.  . Ferrous Sulfate (IRON PO) Take by mouth.  . fluocinonide cream (LIDEX) 0.05 % as needed.   . IRON, FERROUS SULFATE, PO Take by mouth.  Javier Docker Oil 1000 MG CAPS Take by mouth daily.   Marland Kitchen levothyroxine (SYNTHROID, LEVOTHROID) 50 MCG tablet TAKE 1/2 TO 1 TABLET BY MOUTH EVERY DAY AS DIRECTED  . OVER THE COUNTER MEDICATION daily. [facility] brand allergy pill  . zinc gluconate 50 MG tablet Take 50 mg by mouth daily.    No current facility-administered medications on file prior to visit.    No Known Allergies   Past Medical History:  Diagnosis Date  . Arthritis   . Hyperlipidemia   . Hypertension   . Prediabetes   . Thyroid disease   . Vitamin D deficiency    Health Maintenance  Topic Date Due  . TETANUS/TDAP  04/08/2024  . INFLUENZA VACCINE  Completed  . DEXA SCAN  Completed  . PNA vac Low Risk Adult  Completed   Immunization History  Administered Date(s) Administered  . DT 04/08/2014  . Influenza, High Dose Seasonal PF 04/18/2015, 05/15/2016, 05/22/2017, 06/20/2018, 04/21/2019  . Influenza-Unspecified 04/21/2019  . Pneumococcal Conjugate-13 05/15/2016  . Pneumococcal Polysaccharide-23 11/18/2006, 04/08/2014  . Td 07/16/2002  . Zoster 01/29/2007   Last Colon - 01/13/2007 - Deatra Ina - recc 10 yr f/u - overdue  Cologard  - 08/03/2017 - Negative - recc 3 yr f/u due Jan 2022  Last MGM - 07/28/2018  Last dexaBMD - 05/13/19  ( T= -2.1)  Osteopenia  Past Surgical History:  Procedure Laterality Date  . APPENDECTOMY    . OTHER SURGICAL HISTORY  1981   BTL   . TONSILLECTOMY     Family History  Problem Relation Age of Onset  . Heart disease Mother   . Asthma Mother   .  COPD Father   . Heart disease Father   . Gout Father   . Cancer Brother   . Breast cancer Neg Hx    Social History   Tobacco Use  . Smoking status: Never Smoker  . Smokeless tobacco: Never Used  Substance Use Topics  . Alcohol use: Yes    Comment: rarely  . Drug use: No    ROS Constitutional: Denies fever, chills, weight loss/gain, headaches, insomnia,  night sweats, and change in appetite. Does c/o fatigue. Eyes: Denies redness, blurred vision, diplopia, discharge, itchy, watery eyes.  ENT: Denies discharge, congestion, post nasal drip, epistaxis, sore throat, earache, hearing loss, dental pain, Tinnitus, Vertigo, Sinus pain, snoring.  Cardio: Denies chest pain, palpitations, irregular heartbeat, syncope,  dyspnea, diaphoresis, orthopnea, PND, claudication, edema Respiratory: denies cough, dyspnea, DOE, pleurisy, hoarseness, laryngitis, wheezing.  Gastrointestinal: Denies dysphagia, heartburn, reflux, water brash, pain, cramps, nausea, vomiting, bloating, diarrhea, constipation, hematemesis, melena, hematochezia, jaundice, hemorrhoids Genitourinary: Denies dysuria, frequency, urgency, nocturia, hesitancy, discharge, hematuria, flank pain Breast: Breast lumps, nipple discharge, bleeding.  Musculoskeletal: Denies arthralgia, myalgia, stiffness, Jt. Swelling, pain, limp, and strain/sprain. Denies falls. Skin: Denies puritis, rash, hives, warts, acne, eczema, changing in skin lesion Neuro: No weakness, tremor, incoordination, spasms, paresthesia, pain Psychiatric: Denies confusion, memory loss, sensory loss. Denies Depression. Endocrine: Denies change in weight, skin, hair change, nocturia, and paresthesia, diabetic polys, visual blurring, hyper / hypo glycemic episodes.  Heme/Lymph: No excessive bleeding, bruising, enlarged lymph nodes.  Physical Exam  BP 124/66   Pulse 63   Temp (!) 97.5 F (36.4 C)   Ht 5' 2.5" (1.588 m)   Wt 156 lb 9.6 oz (71 kg)   SpO2 96%   BMI 28.19 kg/m   General Appearance: Well nourished, well groomed and in no apparent distress.  Eyes: PERRLA, EOMs, conjunctiva no swelling or erythema, normal fundi and vessels. Sinuses: No frontal/maxillary tenderness ENT/Mouth: EACs patent / TMs  nl. Nares clear without erythema, swelling, mucoid exudates. Oral hygiene is good. No erythema, swelling, or exudate. Tongue normal, non-obstructing. Tonsils not swollen or erythematous. Hearing normal.  Neck: Supple, thyroid not palpable. No bruits, nodes or JVD. Respiratory: Respiratory effort normal.  BS equal and clear bilateral without rales, rhonci, wheezing or stridor. Cardio: Heart sounds are normal with regular rate and rhythm and no murmurs, rubs or gallops. Peripheral pulses  are normal and equal bilaterally without edema. No aortic or femoral bruits. Chest: symmetric with normal excursions and percussion. Breasts: Symmetric, without lumps, nipple discharge, retractions, or fibrocystic changes.  Abdomen: Flat, soft with bowel sounds active. Nontender, no guarding, rebound, hernias, masses, or organomegaly.  Lymphatics: Non tender without lymphadenopathy.  Genitourinary:  Musculoskeletal: Full ROM all peripheral extremities, joint stability, 5/5 strength, and normal gait. Skin: Warm and dry without rashes, lesions, cyanosis, clubbing or  ecchymosis.  Neuro: Cranial nerves intact, reflexes equal bilaterally. Normal muscle tone, no cerebellar symptoms. Sensation intact.  Pysch: Alert and oriented X 3, normal affect, Insight and Judgment appropriate.   Assessment and Plan  1. Labile hypertension  - EKG 12-Lead - Urinalysis, Routine w reflex microscopic - Microalbumin / Creatinine Urine Ratio - CBC with Diff - COMPLETE METABOLIC PANEL WITH GFR - Magnesium - TSH  2. Hyperlipidemia, mixed  - EKG 12-Lead - Lipid Profile - TSH  3. Abnormal glucose  - EKG 12-Lead - Hemoglobin A1c ([facility]) - Insulin, random  4. Vitamin D deficiency  - Vitamin D (25 hydroxy)  5. Prediabetes  - Hemoglobin A1c ([facility]) - Insulin,  random  6. Hypothyroidism, unspecified type  - TSH  7. Screening for colorectal cancer  - POC Hemoccult Bld/Stl  8. Screening for ischemic heart disease  - EKG 12-Lead  9. FHx: heart disease  10. Medication management  - Urinalysis, Routine w reflex microscopic - Microalbumin / Creatinine Urine Ratio - CBC with Diff - COMPLETE METABOLIC PANEL WITH GFR - Magnesium - Lipid Profile - TSH - Hemoglobin A1c ([facility]) - Insulin, random - Vitamin D (25 hydroxy)          Patient was counseled in prudent diet to achieve/maintain BMI less than 25 for weight control, BP monitoring, regular exercise and medications. Discussed med's  effects and SE's. Screening labs and tests as requested with regular follow-up as recommended. Over 40 minutes of exam, counseling, chart review and high complex critical decision making was performed.   Kirtland Bouchard, MD

## 2019-06-16 NOTE — Patient Instructions (Signed)

## 2019-06-17 ENCOUNTER — Encounter: Payer: Self-pay | Admitting: Internal Medicine

## 2019-06-17 ENCOUNTER — Ambulatory Visit (INDEPENDENT_AMBULATORY_CARE_PROVIDER_SITE_OTHER): Payer: Medicare Other | Admitting: Internal Medicine

## 2019-06-17 ENCOUNTER — Other Ambulatory Visit: Payer: Self-pay

## 2019-06-17 ENCOUNTER — Encounter (INDEPENDENT_AMBULATORY_CARE_PROVIDER_SITE_OTHER): Payer: Self-pay

## 2019-06-17 VITALS — BP 124/66 | HR 63 | Temp 97.5°F | Ht 62.5 in | Wt 156.6 lb

## 2019-06-17 DIAGNOSIS — Z79899 Other long term (current) drug therapy: Secondary | ICD-10-CM

## 2019-06-17 DIAGNOSIS — R0989 Other specified symptoms and signs involving the circulatory and respiratory systems: Secondary | ICD-10-CM

## 2019-06-17 DIAGNOSIS — E559 Vitamin D deficiency, unspecified: Secondary | ICD-10-CM

## 2019-06-17 DIAGNOSIS — R7309 Other abnormal glucose: Secondary | ICD-10-CM

## 2019-06-17 DIAGNOSIS — E039 Hypothyroidism, unspecified: Secondary | ICD-10-CM

## 2019-06-17 DIAGNOSIS — R7303 Prediabetes: Secondary | ICD-10-CM | POA: Diagnosis not present

## 2019-06-17 DIAGNOSIS — Z1212 Encounter for screening for malignant neoplasm of rectum: Secondary | ICD-10-CM

## 2019-06-17 DIAGNOSIS — Z8249 Family history of ischemic heart disease and other diseases of the circulatory system: Secondary | ICD-10-CM

## 2019-06-17 DIAGNOSIS — E782 Mixed hyperlipidemia: Secondary | ICD-10-CM

## 2019-06-17 DIAGNOSIS — Z136 Encounter for screening for cardiovascular disorders: Secondary | ICD-10-CM

## 2019-06-17 DIAGNOSIS — Z1211 Encounter for screening for malignant neoplasm of colon: Secondary | ICD-10-CM

## 2019-06-18 LAB — LIPID PANEL
Cholesterol: 161 mg/dL (ref <200)
HDL: 42 mg/dL — ABNORMAL LOW (ref >=50)
LDL Cholesterol (Calc): 99 mg/dL (calc)
Non-HDL Cholesterol (Calc): 119 mg/dL (calc) (ref <130)
Total CHOL/HDL Ratio: 3.8 (calc) (ref <5.0)
Triglycerides: 103 mg/dL (ref <150)

## 2019-06-18 LAB — URINALYSIS, ROUTINE W REFLEX MICROSCOPIC
Bacteria, UA: NONE SEEN /HPF
Bilirubin Urine: NEGATIVE
Glucose, UA: NEGATIVE
Hgb urine dipstick: NEGATIVE
Hyaline Cast: NONE SEEN /LPF
Ketones, ur: NEGATIVE
Nitrite: NEGATIVE
Protein, ur: NEGATIVE
RBC / HPF: NONE SEEN /HPF (ref 0–2)
Specific Gravity, Urine: 1.024 (ref 1.001–1.03)
WBC, UA: NONE SEEN /HPF (ref 0–5)
pH: 7.5 (ref 5.0–8.0)

## 2019-06-18 LAB — MICROALBUMIN / CREATININE URINE RATIO
Creatinine, Urine: 165 mg/dL (ref 20–275)
Microalb Creat Ratio: 3 mcg/mg creat (ref <30)
Microalb, Ur: 0.5 mg/dL

## 2019-06-18 LAB — CBC WITH DIFFERENTIAL/PLATELET
Absolute Monocytes: 708 cells/uL (ref 200–950)
Basophils Absolute: 83 cells/uL (ref 0–200)
Basophils Relative: 0.9 %
Eosinophils Absolute: 239 cells/uL (ref 15–500)
Eosinophils Relative: 2.6 %
HCT: 41.6 % (ref 35.0–45.0)
Hemoglobin: 13.9 g/dL (ref 11.7–15.5)
Lymphs Abs: 2512 cells/uL (ref 850–3900)
MCH: 27.7 pg (ref 27.0–33.0)
MCHC: 33.4 g/dL (ref 32.0–36.0)
MCV: 83 fL (ref 80.0–100.0)
MPV: 10.1 fL (ref 7.5–12.5)
Monocytes Relative: 7.7 %
Neutro Abs: 5658 cells/uL (ref 1500–7800)
Neutrophils Relative %: 61.5 %
Platelets: 282 10*3/uL (ref 140–400)
RBC: 5.01 10*6/uL (ref 3.80–5.10)
RDW: 13.2 % (ref 11.0–15.0)
Total Lymphocyte: 27.3 %
WBC: 9.2 10*3/uL (ref 3.8–10.8)

## 2019-06-18 LAB — COMPLETE METABOLIC PANEL WITH GFR
AG Ratio: 1.6 (calc) (ref 1.0–2.5)
ALT: 15 U/L (ref 6–29)
AST: 17 U/L (ref 10–35)
Albumin: 4.1 g/dL (ref 3.6–5.1)
Alkaline phosphatase (APISO): 76 U/L (ref 37–153)
BUN: 17 mg/dL (ref 7–25)
CO2: 29 mmol/L (ref 20–32)
Calcium: 9.6 mg/dL (ref 8.6–10.4)
Chloride: 105 mmol/L (ref 98–110)
Creat: 0.87 mg/dL (ref 0.60–0.93)
GFR, Est African American: 74 mL/min/{1.73_m2} (ref >=60)
GFR, Est Non African American: 64 mL/min/{1.73_m2} (ref >=60)
Globulin: 2.6 g/dL (calc) (ref 1.9–3.7)
Glucose, Bld: 94 mg/dL (ref 65–99)
Potassium: 4.1 mmol/L (ref 3.5–5.3)
Sodium: 142 mmol/L (ref 135–146)
Total Bilirubin: 0.4 mg/dL (ref 0.2–1.2)
Total Protein: 6.7 g/dL (ref 6.1–8.1)

## 2019-06-18 LAB — MAGNESIUM: Magnesium: 2 mg/dL (ref 1.5–2.5)

## 2019-06-18 LAB — INSULIN, RANDOM: Insulin: 17.8 u[IU]/mL

## 2019-06-18 LAB — TSH: TSH: 3.18 mIU/L (ref 0.40–4.50)

## 2019-06-18 LAB — VITAMIN D 25 HYDROXY (VIT D DEFICIENCY, FRACTURES): Vit D, 25-Hydroxy: 70 ng/mL (ref 30–100)

## 2019-06-18 LAB — HEMOGLOBIN A1C
Hgb A1c MFr Bld: 5.8 % of total Hgb — ABNORMAL HIGH (ref <5.7)
Mean Plasma Glucose: 120 (calc)
eAG (mmol/L): 6.6 (calc)

## 2019-06-21 ENCOUNTER — Encounter: Payer: Self-pay | Admitting: Internal Medicine

## 2019-06-24 ENCOUNTER — Other Ambulatory Visit: Payer: Self-pay | Admitting: Internal Medicine

## 2019-06-24 DIAGNOSIS — Z1231 Encounter for screening mammogram for malignant neoplasm of breast: Secondary | ICD-10-CM

## 2019-07-01 ENCOUNTER — Other Ambulatory Visit: Payer: Self-pay | Admitting: *Deleted

## 2019-07-01 DIAGNOSIS — Z1211 Encounter for screening for malignant neoplasm of colon: Secondary | ICD-10-CM

## 2019-07-01 LAB — POC HEMOCCULT BLD/STL (HOME/3-CARD/SCREEN)
Card #2 Fecal Occult Blod, POC: NEGATIVE
Card #3 Fecal Occult Blood, POC: NEGATIVE
Fecal Occult Blood, POC: NEGATIVE

## 2019-07-06 DIAGNOSIS — Z1212 Encounter for screening for malignant neoplasm of rectum: Secondary | ICD-10-CM | POA: Diagnosis not present

## 2019-07-06 DIAGNOSIS — Z1211 Encounter for screening for malignant neoplasm of colon: Secondary | ICD-10-CM | POA: Diagnosis not present

## 2019-08-05 DIAGNOSIS — Z85828 Personal history of other malignant neoplasm of skin: Secondary | ICD-10-CM | POA: Diagnosis not present

## 2019-08-05 DIAGNOSIS — C44529 Squamous cell carcinoma of skin of other part of trunk: Secondary | ICD-10-CM | POA: Diagnosis not present

## 2019-08-05 DIAGNOSIS — D485 Neoplasm of uncertain behavior of skin: Secondary | ICD-10-CM | POA: Diagnosis not present

## 2019-08-13 ENCOUNTER — Other Ambulatory Visit: Payer: Self-pay

## 2019-08-13 ENCOUNTER — Ambulatory Visit
Admission: RE | Admit: 2019-08-13 | Discharge: 2019-08-13 | Disposition: A | Discharge status: Home / Self Care | Payer: Medicare Other | Source: Ambulatory Visit | Attending: Internal Medicine | Admitting: Internal Medicine

## 2019-08-13 DIAGNOSIS — Z1231 Encounter for screening mammogram for malignant neoplasm of breast: Secondary | ICD-10-CM | POA: Diagnosis not present

## 2019-08-16 ENCOUNTER — Other Ambulatory Visit: Payer: Self-pay | Admitting: Internal Medicine

## 2019-09-04 ENCOUNTER — Ambulatory Visit: Payer: Self-pay | Admitting: Adult Health

## 2019-09-24 ENCOUNTER — Other Ambulatory Visit: Payer: Self-pay

## 2019-09-24 ENCOUNTER — Encounter: Payer: Self-pay | Admitting: Adult Health

## 2019-09-24 ENCOUNTER — Ambulatory Visit (INDEPENDENT_AMBULATORY_CARE_PROVIDER_SITE_OTHER): Payer: Medicare Other | Admitting: Adult Health

## 2019-09-24 VITALS — BP 136/84 | HR 92 | Temp 97.9°F | Wt 156.8 lb

## 2019-09-24 DIAGNOSIS — E782 Mixed hyperlipidemia: Secondary | ICD-10-CM | POA: Diagnosis not present

## 2019-09-24 DIAGNOSIS — Z79899 Other long term (current) drug therapy: Secondary | ICD-10-CM

## 2019-09-24 DIAGNOSIS — E559 Vitamin D deficiency, unspecified: Secondary | ICD-10-CM | POA: Diagnosis not present

## 2019-09-24 DIAGNOSIS — R6889 Other general symptoms and signs: Secondary | ICD-10-CM

## 2019-09-24 DIAGNOSIS — I10 Essential (primary) hypertension: Secondary | ICD-10-CM

## 2019-09-24 DIAGNOSIS — Z0001 Encounter for general adult medical examination with abnormal findings: Secondary | ICD-10-CM | POA: Diagnosis not present

## 2019-09-24 DIAGNOSIS — M85852 Other specified disorders of bone density and structure, left thigh: Secondary | ICD-10-CM

## 2019-09-24 DIAGNOSIS — E663 Overweight: Secondary | ICD-10-CM | POA: Diagnosis not present

## 2019-09-24 DIAGNOSIS — E039 Hypothyroidism, unspecified: Secondary | ICD-10-CM

## 2019-09-24 DIAGNOSIS — Z0000 Encounter for general adult medical examination without abnormal findings: Secondary | ICD-10-CM

## 2019-09-24 DIAGNOSIS — R7309 Other abnormal glucose: Secondary | ICD-10-CM

## 2019-09-24 LAB — COMPLETE METABOLIC PANEL WITH GFR
AG Ratio: 1.5 (calc) (ref 1.0–2.5)
ALT: 22 U/L (ref 6–29)
AST: 20 U/L (ref 10–35)
Albumin: 4.2 g/dL (ref 3.6–5.1)
Alkaline phosphatase (APISO): 72 U/L (ref 37–153)
BUN: 21 mg/dL (ref 7–25)
CO2: 30 mmol/L (ref 20–32)
Calcium: 10.3 mg/dL (ref 8.6–10.4)
Chloride: 106 mmol/L (ref 98–110)
Creat: 0.75 mg/dL (ref 0.60–0.93)
GFR, Est African American: 89 mL/min/{1.73_m2} (ref >=60)
GFR, Est Non African American: 77 mL/min/{1.73_m2} (ref >=60)
Globulin: 2.8 g/dL (calc) (ref 1.9–3.7)
Glucose, Bld: 99 mg/dL (ref 65–99)
Potassium: 5 mmol/L (ref 3.5–5.3)
Sodium: 141 mmol/L (ref 135–146)
Total Bilirubin: 0.4 mg/dL (ref 0.2–1.2)
Total Protein: 7 g/dL (ref 6.1–8.1)

## 2019-09-24 LAB — LIPID PANEL
Cholesterol: 198 mg/dL (ref <200)
HDL: 43 mg/dL — ABNORMAL LOW (ref >=50)
LDL Cholesterol (Calc): 129 mg/dL (calc) — ABNORMAL HIGH
Non-HDL Cholesterol (Calc): 155 mg/dL (calc) — ABNORMAL HIGH (ref <130)
Total CHOL/HDL Ratio: 4.6 (calc) (ref <5.0)
Triglycerides: 150 mg/dL — ABNORMAL HIGH (ref <150)

## 2019-09-24 LAB — CBC WITH DIFFERENTIAL/PLATELET
Absolute Monocytes: 768 cells/uL (ref 200–950)
Basophils Absolute: 104 cells/uL (ref 0–200)
Basophils Relative: 1.3 %
Eosinophils Absolute: 272 cells/uL (ref 15–500)
Eosinophils Relative: 3.4 %
HCT: 42.3 % (ref 35.0–45.0)
Hemoglobin: 14.1 g/dL (ref 11.7–15.5)
Lymphs Abs: 2472 cells/uL (ref 850–3900)
MCH: 28.1 pg (ref 27.0–33.0)
MCHC: 33.3 g/dL (ref 32.0–36.0)
MCV: 84.3 fL (ref 80.0–100.0)
MPV: 10.2 fL (ref 7.5–12.5)
Monocytes Relative: 9.6 %
Neutro Abs: 4384 cells/uL (ref 1500–7800)
Neutrophils Relative %: 54.8 %
Platelets: 259 10*3/uL (ref 140–400)
RBC: 5.02 10*6/uL (ref 3.80–5.10)
RDW: 13.7 % (ref 11.0–15.0)
Total Lymphocyte: 30.9 %
WBC: 8 10*3/uL (ref 3.8–10.8)

## 2019-09-24 LAB — TSH: TSH: 3.82 mIU/L (ref 0.40–4.50)

## 2019-09-24 NOTE — Patient Instructions (Addendum)
Anna Caldwell , Thank you for taking time to come for your Medicare Wellness Visit. I appreciate your ongoing commitment to your health goals. Please review the following plan we discussed and let me know if I can assist you in the future.   These are the goals we discussed: Goals    . Blood Pressure < 130/80    . Exercise 150 min/wk Moderate Activity    . Weight (lb) < 145 lb (65.8 kg)       This is a list of the screening recommended for you and due dates:  Health Maintenance  Topic Date Due  . Tetanus Vaccine  04/08/2024  . Flu Shot  Completed  . DEXA scan (bone density measurement)  Completed  . Pneumonia vaccines  Completed     HYPERTENSION INFORMATION  Monitor your blood pressure at home, please keep a record and bring that in with you to your next office visit.   Go to the ER if any CP, SOB, nausea, dizziness, severe HA, changes vision/speech  Testing/Procedures: HOW TO TAKE YOUR BLOOD PRESSURE:  Rest 5 minutes before taking your blood pressure.  Don't smoke or drink caffeinated beverages for at least 30 minutes before.  Take your blood pressure before (not after) you eat.  Sit comfortably with your back supported and both feet on the floor (don't cross your legs).  Elevate your arm to heart level on a table or a desk.  Use the proper sized cuff. It should fit smoothly and snugly around your bare upper arm. There should be enough room to slip a fingertip under the cuff. The bottom edge of the cuff should be 1 inch above the crease of the elbow.  Due to a recent study, SPRINT, we have changed our goal for the systolic or top blood pressure number. Ideally we want your top number at 120.  In the [facility] Trial, 5000 people were randomized to a goal BP of 120 and 5000 people were randomized to a goal BP of less than 140. The patients with the goal BP at 120 had LESS DEMENTIA, LESS HEART ATTACKS, AND LESS STROKES, AS WELL AS OVERALL DECREASED MORTALITY OR DEATH RATE.    There was another study that showed taking your blood pressure medications at night decrease cardiovascular events.  However if you are on a fluid pill, please take this in the morning.   If you are willing, our goal BP is the top number of 120.  Your most recent BP: BP: 136/84   Take your medications faithfully as instructed. Maintain a healthy weight. Get at least 150 minutes of aerobic exercise per week. Minimize salt intake. Minimize alcohol intake  DASH Eating Plan DASH stands for "Dietary Approaches to Stop Hypertension." The DASH eating plan is a healthy eating plan that has been shown to reduce high blood pressure (hypertension). Additional health benefits may include reducing the risk of type 2 diabetes mellitus, heart disease, and stroke. The DASH eating plan may also help with weight loss. WHAT DO I NEED TO KNOW ABOUT THE DASH EATING PLAN? For the DASH eating plan, you will follow these general guidelines:  Choose foods with a percent daily value for sodium of less than 5% (as listed on the food label).  Use salt-free seasonings or herbs instead of table salt or sea salt.  Check with your health care provider or pharmacist before using salt substitutes.  Eat lower-sodium products, often labeled as "lower sodium" or "no salt added."  Eat fresh  foods.  Eat more vegetables, fruits, and low-fat dairy products.  Choose whole grains. Look for the word "whole" as the first word in the ingredient list.  Choose fish and skinless chicken or [place] more often than red meat. Limit fish, poultry, and meat to 6 oz (170 g) each day.  Limit sweets, desserts, sugars, and sugary drinks.  Choose heart-healthy fats.  Limit cheese to 1 oz (28 g) per day.  Eat more home-cooked food and less restaurant, buffet, and fast food.  Limit fried foods.  Cook foods using methods other than frying.  Limit canned vegetables. If you do use them, rinse them well to decrease the  sodium.  When eating at a restaurant, ask that your food be prepared with less salt, or no salt if possible. WHAT FOODS CAN I EAT? Seek help from a dietitian for individual calorie needs. Grains Whole grain or whole wheat bread. Brown rice. Whole grain or whole wheat pasta. Quinoa, bulgur, and whole grain cereals. Low-sodium cereals. Corn or whole wheat flour tortillas. Whole grain cornbread. Whole grain crackers. Low-sodium crackers. Vegetables Fresh or frozen vegetables (raw, steamed, roasted, or grilled). Low-sodium or reduced-sodium tomato and vegetable juices. Low-sodium or reduced-sodium tomato sauce and paste. Low-sodium or reduced-sodium canned vegetables.  Fruits All fresh, canned (in natural juice), or frozen fruits. Meat and Other Protein Products Ground beef (85% or leaner), grass-fed beef, or beef trimmed of fat. Skinless chicken or [place]. Ground chicken or [place]. Pork trimmed of fat. All fish and seafood. Eggs. Dried beans, peas, or lentils. Unsalted nuts and seeds. Unsalted canned beans. Dairy Low-fat dairy products, such as skim or 1% milk, 2% or reduced-fat cheeses, low-fat ricotta or cottage cheese, or plain low-fat yogurt. Low-sodium or reduced-sodium cheeses. Fats and Oils Tub margarines without trans fats. Light or reduced-fat mayonnaise and salad dressings (reduced sodium). Avocado. Safflower, olive, or canola oils. Natural peanut or almond butter. Other Unsalted popcorn and pretzels. The items listed above may not be a complete list of recommended foods or beverages. Contact your dietitian for more options. WHAT FOODS ARE NOT RECOMMENDED? Grains White bread. White pasta. White rice. Refined cornbread. Bagels and croissants. Crackers that contain trans fat. Vegetables Creamed or fried vegetables. Vegetables in a cheese sauce. Regular canned vegetables. Regular canned tomato sauce and paste. Regular tomato and vegetable juices. Fruits Dried fruits. Canned fruit in  light or heavy syrup. Fruit juice. Meat and Other Protein Products Fatty cuts of meat. Ribs, chicken wings, bacon, sausage, bologna, salami, chitterlings, fatback, hot dogs, bratwurst, and packaged luncheon meats. Salted nuts and seeds. Canned beans with salt. Dairy Whole or 2% milk, cream, half-and-half, and cream cheese. Whole-fat or sweetened yogurt. Full-fat cheeses or blue cheese. Nondairy creamers and whipped toppings. Processed cheese, cheese spreads, or cheese curds. Condiments Onion and garlic salt, seasoned salt, table salt, and sea salt. Canned and packaged gravies. Worcestershire sauce. Tartar sauce. Barbecue sauce. Teriyaki sauce. Soy sauce, including reduced sodium. Steak sauce. Fish sauce. Oyster sauce. Cocktail sauce. Horseradish. Ketchup and mustard. Meat flavorings and tenderizers. Bouillon cubes. Hot sauce. Tabasco sauce. Marinades. Taco seasonings. Relishes. Fats and Oils Butter, stick margarine, lard, shortening, ghee, and bacon fat. Coconut, palm kernel, or palm oils. Regular salad dressings. Other Pickles and olives. Salted popcorn and pretzels. The items listed above may not be a complete list of foods and beverages to avoid. Contact your dietitian for more information. WHERE CAN I FIND MORE INFORMATION? National Heart, Lung, and Blood Institute: travelstabloid.com Document Released: 06/21/2011 Document Revised:  11/16/2013 Document Reviewed: 05/06/2013 Exit[employer] Patient Information 2015 [place], [place]. This information is not intended to replace advice given to you by your health care provider. Make sure you discuss any questions you have with your health care provider.       Osteopenia  Osteopenia is a loss of thickness (density) inside of the bones. Another name for osteopenia is low bone mass. Mild osteopenia is a normal part of aging. It is not a disease, and it does not cause symptoms. However, if you have osteopenia and continue  to lose bone mass, you could develop a condition that causes the bones to become thin and break more easily (osteoporosis). You may also lose some height, have back pain, and have a stooped posture. Although osteopenia is not a disease, making changes to your lifestyle and diet can help to prevent osteopenia from developing into osteoporosis. What are the causes? Osteopenia is caused by loss of calcium in the bones.  Bones are constantly changing. Old bone cells are continually being replaced with new bone cells. This process builds new bone. The mineral calcium is needed to build new bone and maintain bone density. Bone density is usually highest around age 70. After that, most people's bodies cannot replace all the bone they have lost with new bone. What increases the risk? You are more likely to develop this condition if:  You are older than age 58.  You are a woman who went through menopause early.  You have a long illness that keeps you in bed.  You do not get enough exercise.  You lack certain nutrients (malnutrition).  You have an overactive thyroid gland (hyperthyroidism).  You smoke.  You drink a lot of alcohol.  You are taking medicines that weaken the bones, such as steroids. What are the signs or symptoms? This condition does not cause any symptoms. You may have a slightly higher risk for bone breaks (fractures), so getting fractures more easily than normal may be an indication of osteopenia. How is this diagnosed? Your health care provider can diagnose this condition with a special type of X-ray exam that measures bone density (dual-energy X-ray absorptiometry, DEXA). This test can measure bone density in your hips, spine, and wrists. Osteopenia has no symptoms, so this condition is usually diagnosed after a routine bone density screening test is done for osteoporosis. This routine screening is usually done for:  Women who are age 64 or older.  Men who are age 59 or  older. If you have risk factors for osteopenia, you may have the screening test at an earlier age. How is this treated? Making dietary and lifestyle changes can lower your risk for osteoporosis. If you have severe osteopenia that is close to becoming osteoporosis, your health care provider may prescribe medicines and dietary supplements such as calcium and vitamin D. These supplements help to rebuild bone density. Follow these instructions at home:   Take over-the-counter and prescription medicines only as told by your health care provider. These include vitamins and supplements.  Eat a diet that is high in calcium and vitamin D. ? Calcium is found in dairy products, beans, salmon, and leafy green vegetables like spinach and broccoli. ? Look for foods that have vitamin D and calcium added to them (fortified foods), such as orange juice, cereal, and bread.  Do 30 or more minutes of a weight-bearing exercise every day, such as walking, jogging, or playing a sport. These types of exercises strengthen the bones.  Take precautions at  home to lower your risk of falling, such as: ? Keeping rooms well-lit and free of clutter, such as cords. ? Installing safety rails on stairs. ? Using rubber mats in the bathroom or other areas that are often wet or slippery.  Do not use any products that contain nicotine or tobacco, such as cigarettes and e-cigarettes. If you need help quitting, ask your health care provider.  Avoid alcohol or limit alcohol intake to no more than 1 drink a day for nonpregnant women and 2 drinks a day for men. One drink equals 12 oz of beer, 5 oz of wine, or 1 oz of hard liquor.  Keep all follow-up visits as told by your health care provider. This is important. Contact a health care provider if:  You have not had a bone density screening for osteoporosis and you are: ? A woman, age 17 or older. ? A man, age 13 or older.  You are a postmenopausal woman who has not had a bone  density screening for osteoporosis.  You are older than age 3 and you want to know if you should have bone density screening for osteoporosis. Summary  Osteopenia is a loss of thickness (density) inside of the bones. Another name for osteopenia is low bone mass.  Osteopenia is not a disease, but it may increase your risk for a condition that causes the bones to become thin and break more easily (osteoporosis).  You may be at risk for osteopenia if you are older than age 72 or if you are a woman who went through early menopause.  Osteopenia does not cause any symptoms, but it can be diagnosed with a bone density screening test.  Dietary and lifestyle changes are the first treatment for osteopenia. These may lower your risk for osteoporosis. This information is not intended to replace advice given to you by your health care provider. Make sure you discuss any questions you have with your health care provider. Document Revised: 06/14/2017 Document Reviewed: 04/10/2017 Elsevier Patient Education  2020 [employer].

## 2019-09-24 NOTE — Progress Notes (Signed)
MEDICARE ANNUAL WELLNESS VISIT AND FOLLOW UP  Assessment:   Diagnoses and all orders for this visit:  Encounter for Medicare annual wellness exam  Essential hypertension Labile; currently managed without medications - will continue to monitor and initiate treatment for persistently elevated BPs as indicated Monitor blood pressure at home; call if consistently over 130/80 Continue DASH diet.   Reminder to go to the ER if any CP, SOB, nausea, dizziness, severe HA, changes vision/speech, left arm numbness and tingling and jaw pain.  Hypothyroidism, unspecified type continue medications the same reminded to take on an empty stomach 30-65mins before food.  -     TSH  Mixed hyperlipidemia At goal; continue medications krill oil supplement Continue low cholesterol diet and exercise.  Defer lipid panel  Vitamin D deficiency Continue supplementation Defer checking vitamin D level  Prediabetes Discussed disease and risks Discussed diet/exercise, weight management  A1C q103m; defer today, monitor weight, check CMP for serum glucose  Medication management -     CBC with Differential/Platelet -     CMP/GFR  Osteopenia Repeat dexa 04/2021, continue Vit D and Ca, reviewed weight bearing exercises  Overweight Long discussion about weight loss, diet, and exercise Recommended diet heavy in fruits and veggies and low in animal meats, cheeses, and dairy products, appropriate calorie intake Follow up in 6 months   Over 40 minutes of exam, counseling, chart review and critical decision making was performed Future Appointments  Date Time Provider [place]  12/31/2019 10:30 AM Unk Pinto, MD [facility] None  07/12/2020  3:00 PM Unk Pinto, MD [facility] None     Plan:   During the course of the visit the patient was educated and counseled about appropriate screening and preventive services including:    Pneumococcal vaccine   Prevnar 13  Influenza  vaccine  Td vaccine  Screening electrocardiogram  Bone densitometry screening  Colorectal cancer screening  Diabetes screening  Glaucoma screening  Nutrition counseling   Advanced directives: requested   Subjective:  Anna Caldwell is a remarkably healthy 79 y.o. female who presents for Medicare Annual Wellness Visit and 3 month follow up.   she has a diagnosis of GERD which is currently managed by lifestyle modification she reports symptoms is currently well controlled, and denies breakthrough reflux, burning in chest, hoarseness or cough.    BMI is Body mass index is 28.22 kg/m., she has been working on diet and exercise, will walk some on her property but not consistently.  Wt Readings from Last 3 Encounters:  09/24/19 156 lb 12.8 oz (71.1 kg)  06/17/19 156 lb 9.6 oz (71 kg)  03/11/19 157 lb (71.2 kg)    She does have BP cuff, hasn't been checking, today their BP is BP: 136/84 She does workout. She denies chest pain, shortness of breath, dizziness. She reports has brief sensation of fluttering in chest when feeling anxious, has had for decades, no changes, denies accompaniments.   She is not on cholesterol medication and denies myalgias. Her cholesterol is not at goal. The cholesterol last visit was:   Lab Results  Component Value Date   CHOL 161 06/17/2019   HDL 42 (L) 06/17/2019   LDLCALC 99 06/17/2019   TRIG 103 06/17/2019   CHOLHDL 3.8 06/17/2019    She has been working on diet and exercise for prediabetes, and denies increased appetite, nausea, paresthesia of the feet, polydipsia, polyuria, visual disturbances and vomiting. Last A1C in the office was back within normal range:  Lab Results  Component Value Date   HGBA1C 5.8 (H) 06/17/2019   Last GFR: Lab Results  Component Value Date   GFRNONAA 64 06/17/2019   She is on thyroid medication. Her medication was not changed last visit. She reports taking 1/2 tab of 50 mcg daily.  Lab Results   Component Value Date   TSH 3.18 06/17/2019   Patient is on Vitamin D supplement and at goal at the last visit:    Lab Results  Component Value Date   VD25OH 70 06/17/2019      Medication Review: Current Outpatient Medications on File Prior to Visit  Medication Sig Dispense Refill  . Ascorbic Acid (VITAMIN C PO) Take by mouth daily.    Marland Kitchen aspirin 81 MG chewable tablet Chew by mouth daily.    . Cholecalciferol (VITAMIN D PO) Take 10,000 Int'l Units by mouth daily.    . Ferrous Sulfate (IRON PO) Take by mouth.    . fluocinonide cream (LIDEX) 0.05 % as needed.     Javier Docker Oil 1000 MG CAPS Take by mouth daily.     Marland Kitchen levothyroxine (SYNTHROID) 50 MCG tablet Take 1/2 to 1  tablet daily as directed on an empty stomach with only water for 30 minutes & no Antacid meds, Calcium or Magnesium for 4 hours & avoid Biotin 90 tablet 1  . OVER THE COUNTER MEDICATION daily. [facility] brand allergy pill    . zinc gluconate 50 MG tablet Take 50 mg by mouth daily.    . IRON, FERROUS SULFATE, PO Take by mouth.     No current facility-administered medications on file prior to visit.    No Known Allergies  Current Problems (verified) Patient Active Problem List   Diagnosis Date Noted  . Osteopenia 05/13/2019  . Overweight (BMI 25.0-29.9) 04/18/2015  . Medication management 06/22/2013  . Hypertension   . Hyperlipidemia   . Vitamin D deficiency   . Hypothyroidism   . Abnormal glucose     Screening Tests Immunization History  Administered Date(s) Administered  . DT (Pediatric) 04/08/2014  . Influenza, High Dose Seasonal PF 04/18/2015, 05/15/2016, 05/22/2017, 06/20/2018, 04/21/2019  . Influenza-Unspecified 04/21/2019  . Pneumococcal Conjugate-13 05/15/2016  . Pneumococcal Polysaccharide-23 11/18/2006, 04/08/2014  . Td 07/16/2002  . Zoster 01/29/2007   Preventative care: Last colonoscopy: 12/2006  Last cologuard: 07/2017 neg Last mammogram: 07/2019 Last pap smear/pelvic exam: 2011, will not have  another DEXA: 04/2019 L fem T -2.1  Prior vaccinations: TD or Tdap: 2015  Influenza: 04/2019 Pneumococcal: 2015 Prevnar13: 2017 Shingles/Zostavax: 2008  Names of Other Physician/Practitioners you currently use: 1. [facility] here for primary care 2. [facility], Dr. Valetta Close, eye doctor, last visit 04/2019, monitoring mild cataracts 3. [facility], Dr. Criss Alvine, dentist, last visit  2021 4. Dr. Ubaldo Glassing, derm, 08/2019  Patient Care Team: Unk Pinto, MD as PCP - General (Internal Medicine) Inda Castle, MD (Inactive) as Consulting Physician (Gastroenterology) Marygrace Drought, MD as Consulting Physician (Ophthalmology) Rolm Bookbinder, MD as Consulting Physician (Dermatology)  SURGICAL HISTORY She  has a past surgical history that includes Appendectomy; Tonsillectomy; and Other surgical history (1981). FAMILY HISTORY Her family history includes Asthma in her mother; COPD in her father; Cancer in her brother; Gout in her father; Heart disease in her father and mother. SOCIAL HISTORY She  reports that she has never smoked. She has never used smokeless tobacco. She reports current alcohol use. She reports that she does not use drugs.   MEDICARE WELLNESS OBJECTIVES: Physical  activity: Current Exercise Habits: Home exercise routine, Type of exercise: walking, Time (Minutes): 30, Frequency (Times/Week): 7, Weekly Exercise (Minutes/Week): 210, Intensity: Mild, Exercise limited by: None identified Cardiac risk factors: Cardiac Risk Factors include: advanced age (>79men, >19 women);dyslipidemia Depression/mood screen:   Depression screen [facility] 2/9 09/24/2019  Decreased Interest 0  Down, Depressed, Hopeless 0  PHQ - 2 Score 0    ADLs:  In your present state of health, do you have any difficulty performing the following activities: 09/24/2019 06/16/2019  Hearing? N N  Vision? N N  Difficulty concentrating or making decisions? N N  Walking  or climbing stairs? N N  Dressing or bathing? N N  Doing errands, shopping? N N  Some recent data might be hidden     Cognitive Testing  Alert? Yes  Normal Appearance?Yes  Oriented to person? Yes  Place? Yes   Time? Yes  Recall of three objects?  Yes  Can perform simple calculations? Yes  Displays appropriate judgment?Yes  Can read the correct time from a watch face?Yes  EOL planning: Does Patient Have a Medical Advance Directive?: Yes Type of Advance Directive: Healthcare Power of Attorney, Living will Does patient want to make changes to medical advance directive?: No - Patient declined Copy of [place] in Chart?: No - copy requested  Review of Systems  Constitutional: Negative for malaise/fatigue and weight loss.  HENT: Negative for hearing loss and tinnitus.   Eyes: Negative for blurred vision and double vision.  Respiratory: Negative for cough, shortness of breath and wheezing.   Cardiovascular: Negative for chest pain, palpitations, orthopnea, claudication and leg swelling.  Gastrointestinal: Negative for abdominal pain, blood in stool, constipation, diarrhea, heartburn, melena, nausea and vomiting.  Genitourinary: Negative.   Musculoskeletal: Negative for joint pain and myalgias.  Skin: Negative for rash.  Neurological: Negative for dizziness, tingling, sensory change, weakness and headaches.  Endo/Heme/Allergies: Negative for environmental allergies and polydipsia.  Psychiatric/Behavioral: Negative.  Negative for depression. The patient is not nervous/anxious and does not have insomnia.   All other systems reviewed and are negative.    Objective:     Today's Vitals   09/24/19 0959 09/24/19 1019  BP: (!) 142/74 136/84  Pulse: 92   Temp: 97.9 F (36.6 C)   SpO2: 99%   Weight: 156 lb 12.8 oz (71.1 kg)    Body mass index is 28.22 kg/m.  General appearance: alert, no distress, WD/WN, female HEENT: normocephalic, sclerae anicteric, TMs pearly,  nares patent, no discharge or erythema, pharynx normal Oral cavity: MMM, no lesions Neck: supple, no lymphadenopathy, no thyromegaly, no masses Heart: RRR, normal S1, S2, no murmurs Lungs: CTA bilaterally, no wheezes, rhonchi, or rales Abdomen: +bs, soft, non tender, non distended, no masses, no hepatomegaly, no splenomegaly Musculoskeletal: nontender, no swelling, no obvious deformity Extremities: no edema, no cyanosis, no clubbing Pulses: 2+ symmetric, upper and lower extremities, normal cap refill Neurological: alert, oriented x 3, CN2-12 intact, strength normal upper extremities and lower extremities, sensation normal throughout, DTRs 2+ throughout, no cerebellar signs, gait normal Psychiatric: normal affect, behavior normal, pleasant   Medicare Attestation I have personally reviewed: The patient's medical and social history Their use of alcohol, tobacco or illicit drugs Their current medications and supplements The patient's functional ability including ADLs,fall risks, home safety risks, cognitive, and hearing and visual impairment Diet and physical activities Evidence for depression or mood disorders  The patient's weight, height, BMI, and visual acuity have been recorded in the chart.  I  have made referrals, counseling, and provided education to the patient based on review of the above and I have provided the patient with a written personalized care plan for preventive services.     Izora Ribas, NP   09/24/2019

## 2019-10-26 ENCOUNTER — Encounter: Payer: Self-pay | Admitting: Internal Medicine

## 2019-10-26 ENCOUNTER — Ambulatory Visit (INDEPENDENT_AMBULATORY_CARE_PROVIDER_SITE_OTHER): Payer: Medicare Other | Admitting: Adult Health

## 2019-10-26 ENCOUNTER — Other Ambulatory Visit: Payer: Self-pay

## 2019-10-26 ENCOUNTER — Encounter: Payer: Self-pay | Admitting: Adult Health

## 2019-10-26 VITALS — BP 122/72 | HR 83 | Temp 97.5°F | Wt 152.2 lb

## 2019-10-26 DIAGNOSIS — R197 Diarrhea, unspecified: Secondary | ICD-10-CM

## 2019-10-26 DIAGNOSIS — R1031 Right lower quadrant pain: Secondary | ICD-10-CM

## 2019-10-26 DIAGNOSIS — R1032 Left lower quadrant pain: Secondary | ICD-10-CM

## 2019-10-26 MED ORDER — HYOSCYAMINE SULFATE SL 0.125 MG SL SUBL: 0.1250 mg | SUBLINGUAL_TABLET | SUBLINGUAL | 0 refills | Status: DC | PRN

## 2019-10-26 NOTE — Patient Instructions (Addendum)
Try "align" probiotic or "standard process brand - GI stability"  Levsin/hyosciamine as needed for stomach cramping or recurrent diarrhea  Push fluids, limit roughage, stick to bland foods, slowly reintroduce as tolerated     Food Choices to Help Relieve Diarrhea, Adult When you have diarrhea, the foods you eat and your eating habits are very important. Choosing the right foods and drinks can help:  Relieve diarrhea.  Replace lost fluids and nutrients.  Prevent dehydration. What general guidelines should I follow?  Relieving diarrhea  Choose foods with less than 2 g or .07 oz. of fiber per serving.  Limit fats to less than 8 tsp (38 g or 1.34 oz.) a day.  Avoid the following: ? Foods and beverages sweetened with high-fructose corn syrup, honey, or sugar alcohols such as xylitol, sorbitol, and mannitol. ? Foods that contain a lot of fat or sugar. ? Fried, greasy, or spicy foods. ? High-fiber grains, breads, and cereals. ? Raw fruits and vegetables.  Eat foods that are rich in probiotics. These foods include dairy products such as yogurt and fermented milk products. They help increase healthy bacteria in the stomach and intestines (gastrointestinal tract, or GI tract).  If you have lactose intolerance, avoid dairy products. These may make your diarrhea worse.  Take medicine to help stop diarrhea (antidiarrheal medicine) only as told by your health care provider. Replacing nutrients  Eat small meals or snacks every 3-4 hours.  Eat bland foods, such as white rice, toast, or baked potato, until your diarrhea starts to get better. Gradually reintroduce nutrient-rich foods as tolerated or as told by your health care provider. This includes: ? Well-cooked protein foods. ? Peeled, seeded, and soft-cooked fruits and vegetables. ? Low-fat dairy products.  Take vitamin and mineral supplements as told by your health care provider. Preventing dehydration  Start by sipping  water or a special solution to prevent dehydration (oral rehydration solution, ORS). Urine that is clear or pale yellow means that you are getting enough fluid.  Try to drink at least 8-10 cups of fluid each day to help replace lost fluids.  You may add other liquids in addition to water, such as clear juice or decaffeinated sports drinks, as tolerated or as told by your health care provider.  Avoid drinks with caffeine, such as coffee, tea, or soft drinks.  Avoid alcohol. What foods are recommended?     The items listed may not be a complete list. Talk with your health care provider about what dietary choices are best for you. Grains White rice. White, [place], or pita breads (fresh or toasted), including plain rolls, buns, or bagels. White pasta. Saltine, soda, or graham crackers. Pretzels. Low-fiber cereal. Cooked cereals made with water (such as cornmeal, farina, or cream cereals). Plain muffins. Matzo. Melba toast. Zwieback. Vegetables Potatoes (without the skin). Most well-cooked and canned vegetables without skins or seeds. Tender lettuce. Fruits Apple sauce. Fruits canned in juice. Cooked apricots, cherries, grapefruit, peaches, pears, or plums. Fresh bananas and cantaloupe. Meats and other protein foods Baked or boiled chicken. Eggs. Tofu. Fish. Seafood. Smooth nut butters. Ground or well-cooked tender beef, ham, veal, lamb, pork, or poultry. Dairy Plain yogurt, kefir, and unsweetened liquid yogurt. Lactose-free milk, buttermilk, skim milk, or soy milk. Low-fat or nonfat hard cheese. Beverages Water. Low-calorie sports drinks. Fruit juices without pulp. Strained tomato and vegetable juices. Decaffeinated teas. Sugar-free beverages not sweetened with sugar alcohols. Oral rehydration solutions, if approved by your health care provider. Seasoning and other  foods Bouillon, broth, or soups made from recommended foods. What foods are not recommended? The items listed may not be a  complete list. Talk with your health care provider about what dietary choices are best for you. Grains Whole grain, whole wheat, bran, or rye breads, rolls, pastas, and crackers. Wild or brown rice. Whole grain or bran cereals. Barley. Oats and oatmeal. Corn tortillas or taco shells. Granola. Popcorn. Vegetables Raw vegetables. Fried vegetables. Cabbage, broccoli, Brussels sprouts, artichokes, baked beans, beet greens, corn, kale, legumes, peas, sweet potatoes, and yams. Potato skins. Cooked spinach and cabbage. Fruits Dried fruit, including raisins and dates. Raw fruits. Stewed or dried prunes. Canned fruits with syrup. Meat and other protein foods Fried or fatty meats. Deli meats. Chunky nut butters. Nuts and seeds. Beans and lentils. Berniece Salines. Hot dogs. Sausage. Dairy High-fat cheeses. Whole milk, chocolate milk, and beverages made with milk, such as milk shakes. Half-and-half. Cream. sour cream. Ice cream. Beverages Caffeinated beverages (such as coffee, tea, soda, or energy drinks). Alcoholic beverages. Fruit juices with pulp. Prune juice. Soft drinks sweetened with high-fructose corn syrup or sugar alcohols. High-calorie sports drinks. Fats and oils Butter. Cream sauces. Margarine. Salad oils. Plain salad dressings. Olives. Avocados. Mayonnaise. Sweets and desserts Sweet rolls, doughnuts, and sweet breads. Sugar-free desserts sweetened with sugar alcohols such as xylitol and sorbitol. Seasoning and other foods Honey. Hot sauce. Chili powder. Gravy. Cream-based or milk-based soups. Pancakes and waffles. Summary  When you have diarrhea, the foods you eat and your eating habits are very important.  Make sure you get at least 8-10 cups of fluid each day, or enough to keep your urine clear or pale yellow.  Eat bland foods and gradually reintroduce healthy, nutrient-rich foods as tolerated, or as told by your health care provider.  Avoid high-fiber, fried, greasy, or spicy foods. This  information is not intended to replace advice given to you by your health care provider. Make sure you discuss any questions you have with your health care provider. Document Revised: 10/23/2018 Document Reviewed: 06/29/2016 Elsevier Patient Education  [place] sublingual tablet - for abdominal cramping/diarrhea What is this medicine? HYOSCYAMINE (hye oh SYE a meen) is used to treat stomach and bladder problems. This medicine is also used for rhinitis, to reduce some problems caused by Parkinson's disease, and for the treatment of poisoning with drugs that are usually used to treat myasthenia gravis. This medicine may be used for other purposes; ask your health care provider or pharmacist if you have questions. COMMON BRAND NAME(S): A-Spas S/L, HyoMax-SL, Hyosol SL, Levsin SL, OSCIMIN, Symax What should I tell my health care provider before I take this medicine? They need to know if you have any of these conditions:  difficulty passing urine  glaucoma  heart disease, or previous heart attack  myasthenia gravis  prostate trouble  stomach obstruction  ulcerative colitis  an unusual or allergic reaction to hyoscyamine, other medicines, foods, dyes, or preservatives  pregnant or trying to get pregnant  breast-feeding How should I use this medicine? Take this medicine by mouth. Follow the directions on the prescription label. These tablets may be placed under the tongue, and allowed to dissolve, swallowed whole, or chewed. Take your doses at regular intervals. Do not take your medicine more often than directed. Talk to your pediatrician regarding the use of this medicine in children. Special care may be needed. While this medicine may be prescribed for children as young as 12 years for selected  conditions, precautions do apply. Overdosage: If you think you have taken too much of this medicine contact a poison control center or emergency room at once. NOTE:  This medicine is only for you. Do not share this medicine with others. What if I miss a dose? If you miss a dose, take it as soon as you can. If it is almost time for your next dose, take only that dose. Do not take double or extra doses. What may interact with this medicine?  amantadine  antacids  benztropine  donepezil  galantamine  medicines for hay fever and other allergies  medicines for mental depression  medicines for mental problems or psychotic disturbances  rivastigmine  tacrine This list may not describe all possible interactions. Give your health care provider a list of all the medicines, herbs, non-prescription drugs, or dietary supplements you use. Also tell them if you smoke, drink alcohol, or use illegal drugs. Some items may interact with your medicine. What should I watch for while using this medicine? You may get dizzy. Do not drive, use machinery, or do anything that needs mental alertness until you know how this medicine affects you. To reduce the risk of dizzy or fainting spells, do not sit or stand up quickly, especially if you are an older patient. Alcohol can make you more dizzy. Avoid alcoholic drinks. Stay out of bright light and wear sunglasses if this medicine makes your eyes more sensitive to light. Your mouth may get dry. Chewing sugarless gum or sucking hard candy, and drinking plenty of water may help. Contact your doctor if the problem does not go away or is severe. This medicine may cause dry eyes and blurred vision. If you wear contact lenses you may feel some discomfort. Lubricating drops may help. See your eye doctor if the problem does not go away or is severe. Avoid extreme heat (e.g., hot tubs, saunas). This medicine can cause you to sweat less than normal. Your body temperature could increase to dangerous levels, which may lead to heat stroke. What side effects may I notice from receiving this medicine? Side effects that you should report to  your doctor or health care professional as soon as possible:  anxiety, nervousness  confusion  dizziness or fainting spells  fast heartbeat  fever  pain or difficulty passing urine  unusually weak or tired  vomiting Side effects that usually do not require medical attention (report to your doctor or health care professional if they continue or are bothersome):  altered taste  constipation  nausea This list may not describe all possible side effects. Call your doctor for medical advice about side effects. You may report side effects to FDA at 1-800-FDA-1088. Where should I keep my medicine? Keep out of the reach of children. Store at room temperature between 15 and 30 degrees C (59 and 86 degrees F). Throw away any unused medicine after the expiration date. NOTE: This sheet is a summary. It may not cover all possible information. If you have questions about this medicine, talk to your doctor, pharmacist, or health care provider.  2020 Elsevier/Gold Standard (2007-11-17 14:37:10)

## 2019-10-26 NOTE — Progress Notes (Signed)
Assessment and Plan:  Anna Caldwell was seen today for gi problem.  Diagnoses and all orders for this visit:  Bilateral lower abdominal cramping Diarrhea, unspecified type Possible viral enterocolitis though appears to be resolving spontaneously, no red flags, no concerning hx; no recent abx or travel, family is well  Check CBC, electrolytes, signs of dehydration; no stool specimen due recent formed stools consider stool pathogens if recurrent watery diarrhea Reviewed bland diet Push hydration Will give levsin PRN for any residual cramping episodes Benign exam She appears appropriate for monitoring and supportive care only at this time unless indicated by labs Please go to the ER if you have any severe AB pain, unable to hold down food/water, blood in stool or vomit, chest pain, shortness of breath, or any worsening symptoms.  She is in agreement with above plan of care and appreciative  She may follow up via mychart or in office if recurrent or new sx;  -     CBC with Differential/Platelet -     COMPLETE METABOLIC PANEL WITH GFR -     Magnesium -     Hyoscyamine Sulfate SL (LEVSIN/SL) 0.125 MG SUBL; Place 0.125 mg under the tongue every 4 (four) hours as needed. For abdominal cramping/diarrhea.  Further disposition pending results of labs. Discussed med's effects and SE's.   Over 15 minutes of exam, counseling, chart review, and critical decision making was performed.   Future Appointments  Date Time Provider [place]  12/31/2019 10:30 AM Unk Pinto, MD [facility] None  07/12/2020  3:00 PM Unk Pinto, MD [facility] None  10/11/2020 10:00 AM Liane Comber, NP [facility] None    ------------------------------------------------------------------------------------------------------------------   HPI BP 122/72   Pulse 83   Temp (!) 97.5 F (36.4 C)   Wt 152 lb 3.2 oz (69 kg)   SpO2 95%   BMI 27.39 kg/m   79 y.o.female presents for evaluation of GI concerns;    She reports began 8-10 days ago noted sudden generalized lower abdominal pain (crampy, excruciating, waxing and waning, up to 7/10, relieved by BM), would have fecal urgency, but with only clear mucus like discharge initially with bloating, gurgling, gassiness, was up 7-8 times per night, also started seeing some brown flecks, then progressed to liquid diarrhea to very soft stool. Appetite has been poor and eating less than usually since this, denies upper abdominal pain, nausea, vomiting, fever/chills. Reports hasn't been eating much, skipping breakfast, has been tolerating a 1/2 sandwich with lunch, minimal other food, overall slowly improving with less frequent cramping episodes, none since 2 days ago. Reports yesterday did have steak, baked potato and veggies and today has had her first seminormal/formed BM. She reports feeling mildly fatigued but otherwise well.   She denies fever/chills, stools are dark but this is typical when on iron supplement - has been on since 2019, no recent anemia. Denies recent new meds/supplements, not on magnesium, does admit to a lot of stress r/t several holidays and events, admits to particularly stressful day the day prior to onset. Denies unusual meals/foods prior to onset, no other known sick individuals around her.   Lab Results  Component Value Date   WBC 8.0 09/24/2019   HGB 14.1 09/24/2019   HCT 42.3 09/24/2019   MCV 84.3 09/24/2019   PLT 259 09/24/2019   Lab Results  Component Value Date   IRON 35 (L) 05/22/2018   TIBC 301 05/22/2018    Had negative cologuard 07/2017, had normal colonoscopy in 2008. Denies history of colitis,  diverticulosis/itis.   She is on thyroid medication. Her medication was not changed last visit.  She has been stable on current dose for several years, no missed doses. Denies heat intolerance, flushing, insomnia, palpations, htn, HA, diaphoretic episodes.  Lab Results  Component Value Date   TSH 3.82 09/24/2019  .     Past Medical History:  Diagnosis Date  . Arthritis   . Hyperlipidemia   . Hypertension   . Prediabetes   . Thyroid disease   . Vitamin D deficiency      No Known Allergies  Current Outpatient Medications on File Prior to Visit  Medication Sig  . Ascorbic Acid (VITAMIN C PO) Take by mouth daily.  Marland Kitchen aspirin 81 MG chewable tablet Chew by mouth daily.  . Cholecalciferol (VITAMIN D PO) Take 10,000 Int'l Units by mouth daily.  . Ferrous Sulfate (IRON PO) Take by mouth.  . fluocinonide cream (LIDEX) 0.05 % as needed.   Javier Docker Oil 1000 MG CAPS Take by mouth daily.   Marland Kitchen levothyroxine (SYNTHROID) 50 MCG tablet Take 1/2 to 1  tablet daily as directed on an empty stomach with only water for 30 minutes & no Antacid meds, Calcium or Magnesium for 4 hours & avoid Biotin  . OVER THE COUNTER MEDICATION daily. [employer] brand allergy pill  . zinc gluconate 50 MG tablet Take 50 mg by mouth daily.   No current facility-administered medications on file prior to visit.    ROS: all negative except above.   Physical Exam:  BP 122/72   Pulse 83   Temp (!) 97.5 F (36.4 C)   Wt 152 lb 3.2 oz (69 kg)   SpO2 95%   BMI 27.39 kg/m   General Appearance: Well nourished, in no apparent distress. Eyes: PERRLA, conjunctiva no swelling or erythema ENT/Mouth:  No erythema, swelling, or exudate on post pharynx.  Tonsils not swollen or erythematous. Hearing normal.  Neck: Supple, thyroid normal.  Respiratory: Respiratory effort normal, BS equal bilaterally without rales, rhonchi, wheezing or stridor.  Cardio: RRR with no MRGs. Brisk peripheral pulses without edema.  Abdomen: Soft, + BS.  Mild R sided mid abdominal tenderness, no guarding, rebound, hernias, masses. Lymphatics: Non tender without lymphadenopathy.  Musculoskeletal: No obvious deformity, normal gait.  Skin: Warm, dry without rashes, lesions, ecchymosis.  Neuro: Normal muscle tone Psych: Awake and oriented X 3, normal affect, Insight and  Judgment appropriate.     Izora Ribas, NP 4:11 PM [facility]

## 2019-10-27 LAB — COMPLETE METABOLIC PANEL WITH GFR
AG Ratio: 1.5 (calc) (ref 1.0–2.5)
ALT: 12 U/L (ref 6–29)
AST: 15 U/L (ref 10–35)
Albumin: 4.1 g/dL (ref 3.6–5.1)
Alkaline phosphatase (APISO): 73 U/L (ref 37–153)
BUN: 20 mg/dL (ref 7–25)
CO2: 29 mmol/L (ref 20–32)
Calcium: 9.7 mg/dL (ref 8.6–10.4)
Chloride: 107 mmol/L (ref 98–110)
Creat: 0.84 mg/dL (ref 0.60–0.93)
GFR, Est African American: 77 mL/min/{1.73_m2} (ref >=60)
GFR, Est Non African American: 67 mL/min/{1.73_m2} (ref >=60)
Globulin: 2.7 g/dL (calc) (ref 1.9–3.7)
Glucose, Bld: 102 mg/dL — ABNORMAL HIGH (ref 65–99)
Potassium: 4.2 mmol/L (ref 3.5–5.3)
Sodium: 144 mmol/L (ref 135–146)
Total Bilirubin: 0.3 mg/dL (ref 0.2–1.2)
Total Protein: 6.8 g/dL (ref 6.1–8.1)

## 2019-10-27 LAB — CBC WITH DIFFERENTIAL/PLATELET
Absolute Monocytes: 655 cells/uL (ref 200–950)
Basophils Absolute: 82 cells/uL (ref 0–200)
Basophils Relative: 0.9 %
Eosinophils Absolute: 264 cells/uL (ref 15–500)
Eosinophils Relative: 2.9 %
HCT: 42.9 % (ref 35.0–45.0)
Hemoglobin: 14 g/dL (ref 11.7–15.5)
Lymphs Abs: 2575 cells/uL (ref 850–3900)
MCH: 27.4 pg (ref 27.0–33.0)
MCHC: 32.6 g/dL (ref 32.0–36.0)
MCV: 84 fL (ref 80.0–100.0)
MPV: 9.9 fL (ref 7.5–12.5)
Monocytes Relative: 7.2 %
Neutro Abs: 5524 cells/uL (ref 1500–7800)
Neutrophils Relative %: 60.7 %
Platelets: 335 10*3/uL (ref 140–400)
RBC: 5.11 10*6/uL — ABNORMAL HIGH (ref 3.80–5.10)
RDW: 13.1 % (ref 11.0–15.0)
Total Lymphocyte: 28.3 %
WBC: 9.1 10*3/uL (ref 3.8–10.8)

## 2019-10-27 LAB — MAGNESIUM: Magnesium: 2.2 mg/dL (ref 1.5–2.5)

## 2019-12-08 DIAGNOSIS — L814 Other melanin hyperpigmentation: Secondary | ICD-10-CM | POA: Diagnosis not present

## 2019-12-08 DIAGNOSIS — D1801 Hemangioma of skin and subcutaneous tissue: Secondary | ICD-10-CM | POA: Diagnosis not present

## 2019-12-08 DIAGNOSIS — B351 Tinea unguium: Secondary | ICD-10-CM | POA: Diagnosis not present

## 2019-12-08 DIAGNOSIS — L821 Other seborrheic keratosis: Secondary | ICD-10-CM | POA: Diagnosis not present

## 2019-12-08 DIAGNOSIS — D692 Other nonthrombocytopenic purpura: Secondary | ICD-10-CM | POA: Diagnosis not present

## 2019-12-08 DIAGNOSIS — L918 Other hypertrophic disorders of the skin: Secondary | ICD-10-CM | POA: Diagnosis not present

## 2019-12-08 DIAGNOSIS — L820 Inflamed seborrheic keratosis: Secondary | ICD-10-CM | POA: Diagnosis not present

## 2019-12-08 DIAGNOSIS — L905 Scar conditions and fibrosis of skin: Secondary | ICD-10-CM | POA: Diagnosis not present

## 2019-12-08 DIAGNOSIS — L565 Disseminated superficial actinic porokeratosis (DSAP): Secondary | ICD-10-CM | POA: Diagnosis not present

## 2019-12-08 DIAGNOSIS — Z85828 Personal history of other malignant neoplasm of skin: Secondary | ICD-10-CM | POA: Diagnosis not present

## 2019-12-30 ENCOUNTER — Encounter: Payer: Self-pay | Admitting: Internal Medicine

## 2019-12-30 NOTE — Progress Notes (Signed)
History of Present Illness:       This very nice 79 y.o. MWF presents for 6 month follow up with HTN, HLD, Pre-Diabetes and Vitamin D Deficiency.       Patient is treated for HTN & BP has been controlled at home. Today's BP is borderline - high normal - 140/76. Patient has had no complaints of any cardiac type chest pain, palpitations, dyspnea / orthopnea / PND, dizziness, claudication, or dependent edema.      Hyperlipidemia is NOT controlled with diet & meds. Patient denies myalgias or other med SE's. Last Lipids were not at goal:  Lab Results  Component Value Date   CHOL 198 09/24/2019   HDL 43 (L) 09/24/2019   LDLCALC 129 (H) 09/24/2019   TRIG 150 (H) 09/24/2019   CHOLHDL 4.6 09/24/2019    Also, the patient has history of PreDiabetes (A1c 6.2% / 2011) and has had no symptoms of reactive hypoglycemia, diabetic polys, paresthesias or visual blurring.  Last A1c was near goal:  Lab Results  Component Value Date   HGBA1C 5.8 (H) 06/17/2019           Further, the patient also has history of Vitamin D Deficiency ("13"/2008)  and supplements vitamin D without any suspected side-effects. Last vitamin D was at goal:  Lab Results  Component Value Date   VD25OH 83 06/17/2019    Current Outpatient Medications on File Prior to Visit  Medication Sig  . Ascorbic Acid (VITAMIN C PO) Take by mouth daily.  Marland Kitchen aspirin 81 MG chewable tablet Chew by mouth daily.  . Cholecalciferol (VITAMIN D PO) Take 10,000 Int'l Units by mouth daily.  . Ferrous Sulfate (IRON PO) Take by mouth.  . fluocinonide cream (LIDEX) 0.05 % as needed.   Marland Kitchen Hyoscyamine Sulfate SL (LEVSIN/SL) 0.125 MG SUBL Place 0.125 mg under the tongue every 4 (four) hours as needed. For abdominal cramping/diarrhea.  Javier Docker Oil 1000 MG CAPS Take by mouth daily.   Marland Kitchen levothyroxine (SYNTHROID) 50 MCG tablet Take 1/2 to 1  tablet daily as directed on an empty stomach with only water for 30 minutes & no Antacid meds, Calcium or  Magnesium for 4 hours & avoid Biotin  . OVER THE COUNTER MEDICATION daily. [employer] brand allergy pill  . zinc gluconate 50 MG tablet Take 50 mg by mouth daily.   No current facility-administered medications on file prior to visit.    No Known Allergies  PMHx:   Past Medical History:  Diagnosis Date  . Arthritis   . Hyperlipidemia   . Hypertension   . Prediabetes   . Thyroid disease   . Vitamin D deficiency     Immunization History  Administered Date(s) Administered  . DT (Pediatric) 04/08/2014  . Influenza, High Dose Seasonal PF 04/18/2015, 05/15/2016, 05/22/2017, 06/20/2018, 04/21/2019  . Influenza-Unspecified 04/21/2019  . Moderna SARS-COVID-2 Vaccination 07/27/2019, 08/24/2019  . Pneumococcal Conjugate-13 05/15/2016  . Pneumococcal Polysaccharide-23 11/18/2006, 04/08/2014  . Td 07/16/2002  . Zoster 01/29/2007    Past Surgical History:  Procedure Laterality Date  . APPENDECTOMY    . OTHER SURGICAL HISTORY  1981   BTL   . TONSILLECTOMY      FHx:    Reviewed / unchanged  SHx:    Reviewed / unchanged   Systems Review:  Constitutional: Denies fever, chills, wt changes, headaches, insomnia, fatigue, night sweats, change in appetite. Eyes: Denies redness, blurred vision, diplopia, discharge, itchy, watery eyes.  ENT: Denies discharge,  congestion, post nasal drip, epistaxis, sore throat, earache, hearing loss, dental pain, tinnitus, vertigo, sinus pain, snoring.  CV: Denies chest pain, palpitations, irregular heartbeat, syncope, dyspnea, diaphoresis, orthopnea, PND, claudication or edema. Respiratory: denies cough, dyspnea, DOE, pleurisy, hoarseness, laryngitis, wheezing.  Gastrointestinal: Denies dysphagia, odynophagia, heartburn, reflux, water brash, abdominal pain or cramps, nausea, vomiting, bloating, diarrhea, constipation, hematemesis, melena, hematochezia  or hemorrhoids. Genitourinary: Denies dysuria, frequency, urgency, nocturia, hesitancy, discharge, hematuria or  flank pain. Musculoskeletal: Denies arthralgias, myalgias, stiffness, jt. swelling, pain, limping or strain/sprain.  Skin: Denies pruritus, rash, hives, warts, acne, eczema or change in skin lesion(s). Neuro: No weakness, tremor, incoordination, spasms, paresthesia or pain. Psychiatric: Denies confusion, memory loss or sensory loss. Endo: Denies change in weight, skin or hair change.  Heme/Lymph: No excessive bleeding, bruising or enlarged lymph nodes.  Physical Exam  BP 140/76   Pulse 72   Temp (!) 97 F (36.1 C)   Resp 16   Ht 5' 2.5" (1.588 m)   Wt 154 lb 9.6 oz (70.1 kg)   BMI 27.83 kg/m   Appears  well nourished, well groomed  and in no distress.  Eyes: PERRLA, EOMs, conjunctiva no swelling or erythema. Sinuses: No frontal/maxillary tenderness ENT/Mouth: EAC's clear, TM's nl w/o erythema, bulging. Nares clear w/o erythema, swelling, exudates. Oropharynx clear without erythema or exudates. Oral hygiene is good. Tongue normal, non obstructing. Hearing intact.  Neck: Supple. Thyroid not palpable. Car 2+/2+ without bruits, nodes or JVD. Chest: Respirations nl with BS clear & equal w/o rales, rhonchi, wheezing or stridor.  Cor: Heart sounds normal w/ regular rate and rhythm without sig. murmurs, gallops, clicks or rubs. Peripheral pulses normal and equal  without edema.  Abdomen: Soft & bowel sounds normal. Non-tender w/o guarding, rebound, hernias, masses or organomegaly.  Lymphatics: Unremarkable.  Musculoskeletal: Full ROM all peripheral extremities, joint stability, 5/5 strength and normal gait.  Skin: Warm, dry without exposed rashes, lesions or ecchymosis apparent.  Neuro: Cranial nerves intact, reflexes equal bilaterally. Sensory-motor testing grossly intact. Tendon reflexes grossly intact.  Pysch: Alert & oriented x 3.  Insight and judgement nl & appropriate. No ideations.  Assessment and Plan:  1. Labile hypertension  - Continue medication, monitor blood pressure at  home.  - Continue DASH diet.  Reminder to go to the ER if any CP,  SOB, nausea, dizziness, severe HA, changes vision/speech.  - CBC with Differential/Platelet - COMPLETE METABOLIC PANEL WITH GFR - Magnesium - TSH  2. Hyperlipidemia, mixed  - Continue diet/meds, exercise,& lifestyle modifications.  - Continue monitor periodic cholesterol/liver & renal functions   - Lipid panel - TSH  3. Abnormal glucose  - Continue diet, exercise  - Lifestyle modifications.  - Monitor appropriate labs.  - Hemoglobin A1c - Insulin, random  4. Vitamin D deficiency  - Continue supplementation.  - VITAMIN D 25 Hydroxy  5. Medication management   - CBC with Differential/Platelet - COMPLETE METABOLIC PANEL WITH GFR - Magnesium - Lipid panel - TSH - Hemoglobin A1c - Insulin, random - VITAMIN D 25 Hydroxy         Discussed  regular exercise, BP monitoring, weight control to achieve/maintain BMI less than 25 and discussed med and SE's. Recommended labs to assess and monitor clinical status with further disposition pending results of labs.  I discussed the assessment and treatment plan with the patient. The patient was provided an opportunity to ask questions and all were answered. The patient agreed with the plan and demonstrated an understanding of  the instructions.  I provided over 30 minutes of exam, counseling, chart review and  complex critical decision making.   Kirtland Bouchard, MD

## 2019-12-30 NOTE — Patient Instructions (Signed)

## 2019-12-31 ENCOUNTER — Ambulatory Visit (INDEPENDENT_AMBULATORY_CARE_PROVIDER_SITE_OTHER): Payer: Medicare Other | Admitting: Internal Medicine

## 2019-12-31 ENCOUNTER — Other Ambulatory Visit: Payer: Self-pay

## 2019-12-31 VITALS — BP 140/76 | HR 72 | Temp 97.0°F | Resp 16 | Ht 62.5 in | Wt 154.6 lb

## 2019-12-31 DIAGNOSIS — E782 Mixed hyperlipidemia: Secondary | ICD-10-CM

## 2019-12-31 DIAGNOSIS — R0989 Other specified symptoms and signs involving the circulatory and respiratory systems: Secondary | ICD-10-CM | POA: Diagnosis not present

## 2019-12-31 DIAGNOSIS — E559 Vitamin D deficiency, unspecified: Secondary | ICD-10-CM

## 2019-12-31 DIAGNOSIS — R7309 Other abnormal glucose: Secondary | ICD-10-CM

## 2019-12-31 DIAGNOSIS — Z79899 Other long term (current) drug therapy: Secondary | ICD-10-CM

## 2020-01-01 LAB — LIPID PANEL
Cholesterol: 164 mg/dL (ref <200)
HDL: 42 mg/dL — ABNORMAL LOW (ref >=50)
LDL Cholesterol (Calc): 98 mg/dL (calc)
Non-HDL Cholesterol (Calc): 122 mg/dL (calc) (ref <130)
Total CHOL/HDL Ratio: 3.9 (calc) (ref <5.0)
Triglycerides: 139 mg/dL (ref <150)

## 2020-01-01 LAB — COMPLETE METABOLIC PANEL WITH GFR
AG Ratio: 1.7 (calc) (ref 1.0–2.5)
ALT: 18 U/L (ref 6–29)
AST: 16 U/L (ref 10–35)
Albumin: 4.2 g/dL (ref 3.6–5.1)
Alkaline phosphatase (APISO): 83 U/L (ref 37–153)
BUN: 15 mg/dL (ref 7–25)
CO2: 29 mmol/L (ref 20–32)
Calcium: 9.5 mg/dL (ref 8.6–10.4)
Chloride: 106 mmol/L (ref 98–110)
Creat: 0.68 mg/dL (ref 0.60–0.93)
GFR, Est African American: 97 mL/min/{1.73_m2} (ref >=60)
GFR, Est Non African American: 84 mL/min/{1.73_m2} (ref >=60)
Globulin: 2.5 g/dL (calc) (ref 1.9–3.7)
Glucose, Bld: 99 mg/dL (ref 65–99)
Potassium: 4.3 mmol/L (ref 3.5–5.3)
Sodium: 140 mmol/L (ref 135–146)
Total Bilirubin: 0.4 mg/dL (ref 0.2–1.2)
Total Protein: 6.7 g/dL (ref 6.1–8.1)

## 2020-01-01 LAB — CBC WITH DIFFERENTIAL/PLATELET
Absolute Monocytes: 605 cells/uL (ref 200–950)
Basophils Absolute: 72 cells/uL (ref 0–200)
Basophils Relative: 1 %
Eosinophils Absolute: 202 cells/uL (ref 15–500)
Eosinophils Relative: 2.8 %
HCT: 41.7 % (ref 35.0–45.0)
Hemoglobin: 13.9 g/dL (ref 11.7–15.5)
Lymphs Abs: 2002 cells/uL (ref 850–3900)
MCH: 28.3 pg (ref 27.0–33.0)
MCHC: 33.3 g/dL (ref 32.0–36.0)
MCV: 84.9 fL (ref 80.0–100.0)
MPV: 10.2 fL (ref 7.5–12.5)
Monocytes Relative: 8.4 %
Neutro Abs: 4320 cells/uL (ref 1500–7800)
Neutrophils Relative %: 60 %
Platelets: 278 10*3/uL (ref 140–400)
RBC: 4.91 10*6/uL (ref 3.80–5.10)
RDW: 13.1 % (ref 11.0–15.0)
Total Lymphocyte: 27.8 %
WBC: 7.2 10*3/uL (ref 3.8–10.8)

## 2020-01-01 LAB — TSH: TSH: 4.32 mIU/L (ref 0.40–4.50)

## 2020-01-01 LAB — VITAMIN D 25 HYDROXY (VIT D DEFICIENCY, FRACTURES): Vit D, 25-Hydroxy: 67 ng/mL (ref 30–100)

## 2020-01-01 LAB — INSULIN, RANDOM: Insulin: 6.2 u[IU]/mL

## 2020-01-01 LAB — HEMOGLOBIN A1C
Hgb A1c MFr Bld: 5.7 % of total Hgb — ABNORMAL HIGH (ref <5.7)
Mean Plasma Glucose: 117 (calc)
eAG (mmol/L): 6.5 (calc)

## 2020-01-01 LAB — MAGNESIUM: Magnesium: 2 mg/dL (ref 1.5–2.5)

## 2020-01-01 NOTE — Progress Notes (Signed)
==============================================================  -    Chol = 164 and LDL = 98 - Both Excellent   - Very low risk for Heart Attack  / Stroke ==============================================================  -  Thyroid - Normal & OK ==============================================================  -  A1c = 5.7% - Almost back to normal - Nondiabetic range ==============================================================  -  All Else - CBC - Kidneys - Electrolytes -  Liver - Magnesium & Thyroid  - all  Normal / OK ==============================================================

## 2020-01-13 DIAGNOSIS — Z79899 Other long term (current) drug therapy: Secondary | ICD-10-CM | POA: Diagnosis not present

## 2020-03-25 DIAGNOSIS — Z20822 Contact with and (suspected) exposure to covid-19: Secondary | ICD-10-CM | POA: Diagnosis not present

## 2020-04-05 NOTE — Progress Notes (Signed)
FOLLOW UP  Assessment and Plan:   Hypertension Fairly controlled by lifestyle Monitor blood pressure at home; patient to call if consistently greater than 130/80 Continue DASH diet.   Reminder to go to the ER if any CP, SOB, nausea, dizziness, severe HA, changes vision/speech, left arm numbness and tingling and jaw pain.  Cholesterol Currently at goal by lifestyle modification Continue low cholesterol diet and exercise.  Check lipid panel.   Other abnormal glucose Recent A1Cs at goal Discussed diet/exercise, weight management  Check 6 month A1C  BMI 29 Continue to recommend diet heavy in fruits and veggies and low in animal meats, cheeses, and dairy products, appropriate calorie intake Diet discussed at length  Hypothyroidism continue medications the same pending lab results reminded to take on an empty stomach 30-81mins before food.  check TSH level  Vitamin D Def Continue supplementation; near goal at last check Check Vit D level next OV  Vertigo Normal neuro, very sporadic Monitor symptoms, get on allergy pill/flonase She was informed to call 911 if she develop any new symptoms such as worsening headaches, episodes of blurred vision, double vision or complete loss of vision or speech difficulties or motor weakness.   Continue diet and meds as discussed. Further disposition pending results of labs. Discussed med's effects and SE's.   Over 30 minutes of exam, counseling, chart review, and critical decision making was performed.   Future Appointments  Date Time Provider [place]  07/12/2020  3:00 PM Unk Pinto, MD [facility] None  10/10/2020 10:00 AM Liane Comber, NP [facility] None    ----------------------------------------------------------------------------------------------------------------------  HPI 79 y.o. female  presents for 3 month follow up on hypertension, cholesterol, glucose management, weight and vitamin D deficiency.   BMI is  Body mass index is 27.18 kg/m., she has not been working on diet and exercise, but plans to restart exercise.  Wt Readings from Last 3 Encounters:  04/06/20 151 lb (68.5 kg)  12/31/19 154 lb 9.6 oz (70.1 kg)  10/26/19 152 lb 3.2 oz (69 kg)   Today their BP is BP: 120/80  She does not workout. She denies chest pain, shortness of breath, dizziness. Has noticed in the past year with quick movements, she will have sensation of movement. Not with exertion, not with standing, quick head movements. No decreased hearing/tinnitus. No sinus issues.  No changes in vision, weakness. No headaches.    She is not on cholesterol medication and denies myalgias. Her cholesterol is at goal. The cholesterol last visit was:   Lab Results  Component Value Date   CHOL 164 12/31/2019   HDL 42 (L) 12/31/2019   LDLCALC 98 12/31/2019   TRIG 139 12/31/2019   CHOLHDL 3.9 12/31/2019    She has not been working on diet and exercise for glucose mangement (hx of prediabetes), and denies foot ulcerations, increased appetite, nausea, paresthesia of the feet, polydipsia, polyuria, visual disturbances, vomiting and weight loss. Last A1C in the office was:  Lab Results  Component Value Date   HGBA1C 5.7 (H) 12/31/2019   She is on thyroid medication. Her medication was not changed last visit.  Takes 1/4 daily.  Lab Results  Component Value Date   TSH 4.32 12/31/2019  .  Patient is on Vitamin D supplement.   Lab Results  Component Value Date   VD25OH 67 12/31/2019        Current Medications:  Current Outpatient Medications on File Prior to Visit  Medication Sig  . aspirin 81 MG chewable tablet Chew  by mouth daily.  . Cholecalciferol (VITAMIN D PO) Take 10,000 Int'l Units by mouth daily.  . fluocinonide cream (LIDEX) 0.05 % as needed.   Javier Docker Oil 1000 MG CAPS Take by mouth daily.   Marland Kitchen levothyroxine (SYNTHROID) 50 MCG tablet Take 1/2 to 1  tablet daily as directed on an empty stomach with only water for 30  minutes & no Antacid meds, Calcium or Magnesium for 4 hours & avoid Biotin  . OVER THE COUNTER MEDICATION daily. [employer] brand allergy pill  . zinc gluconate 50 MG tablet Take 50 mg by mouth daily.  . Ascorbic Acid (VITAMIN C PO) Take by mouth daily.  . Ferrous Sulfate (IRON PO) Take by mouth.  . Hyoscyamine Sulfate SL (LEVSIN/SL) 0.125 MG SUBL Place 0.125 mg under the tongue every 4 (four) hours as needed. For abdominal cramping/diarrhea.   No current facility-administered medications on file prior to visit.     Allergies: No Known Allergies   Medical History:  Past Medical History:  Diagnosis Date  . Arthritis   . Hyperlipidemia   . Hypertension   . Prediabetes   . Thyroid disease   . Vitamin D deficiency    Family history- Reviewed and unchanged Social history- Reviewed and unchanged   Review of Systems:  Review of Systems  Constitutional: Negative for malaise/fatigue and weight loss.  HENT: Negative for hearing loss and tinnitus.   Eyes: Negative for blurred vision and double vision.  Respiratory: Negative for cough, shortness of breath and wheezing.   Cardiovascular: Negative for chest pain, palpitations, orthopnea, claudication and leg swelling.  Gastrointestinal: Negative for abdominal pain, blood in stool, constipation, diarrhea, heartburn, melena, nausea and vomiting.  Genitourinary: Negative.   Musculoskeletal: Negative for joint pain and myalgias.  Skin: Negative for rash.  Neurological: Negative for dizziness, tingling, sensory change, weakness and headaches.  Endo/Heme/Allergies: Negative for polydipsia.  Psychiatric/Behavioral: Negative.   All other systems reviewed and are negative.     Physical Exam: BP 120/80   Pulse 63   Temp (!) 97.3 F (36.3 C)   Wt 151 lb (68.5 kg)   SpO2 96%   BMI 27.18 kg/m  Wt Readings from Last 3 Encounters:  04/06/20 151 lb (68.5 kg)  12/31/19 154 lb 9.6 oz (70.1 kg)  10/26/19 152 lb 3.2 oz (69 kg)   General  Appearance: Well nourished, in no apparent distress. Eyes: PERRLA, EOMs, conjunctiva no swelling or erythema Sinuses: No Frontal/maxillary tenderness ENT/Mouth: Ext aud canals clear, TMs without erythema, bulging. No erythema, swelling, or exudate on post pharynx.  Tonsils not swollen or erythematous. Hearing normal.  Neck: Supple, thyroid normal.  Respiratory: Respiratory effort normal, BS equal bilaterally without rales, rhonchi, wheezing or stridor.  Cardio: RRR with 2-3 systolic murmur with radiation to carotids. Brisk peripheral pulses without edema.  Abdomen: Soft, + BS.  Non tender, no guarding, rebound, hernias, masses. Lymphatics: Non tender without lymphadenopathy.  Musculoskeletal: Full ROM, 5/5 strength, Normal gait Skin: Warm, dry without rashes, lesions, ecchymosis.  Neuro: Cranial nerves intact. No cerebellar symptoms.  Psych: Awake and oriented X 3, normal affect, Insight and Judgment appropriate.    Vicie Mutters, PA-C 11:22 AM [facility]

## 2020-04-06 ENCOUNTER — Ambulatory Visit (INDEPENDENT_AMBULATORY_CARE_PROVIDER_SITE_OTHER): Payer: Medicare Other | Admitting: Physician Assistant

## 2020-04-06 ENCOUNTER — Encounter: Payer: Self-pay | Admitting: Physician Assistant

## 2020-04-06 ENCOUNTER — Other Ambulatory Visit: Payer: Self-pay

## 2020-04-06 VITALS — BP 120/80 | HR 63 | Temp 97.3°F | Wt 151.0 lb

## 2020-04-06 DIAGNOSIS — E782 Mixed hyperlipidemia: Secondary | ICD-10-CM

## 2020-04-06 DIAGNOSIS — I10 Essential (primary) hypertension: Secondary | ICD-10-CM

## 2020-04-06 DIAGNOSIS — E039 Hypothyroidism, unspecified: Secondary | ICD-10-CM | POA: Diagnosis not present

## 2020-04-06 DIAGNOSIS — Z79899 Other long term (current) drug therapy: Secondary | ICD-10-CM | POA: Diagnosis not present

## 2020-04-06 DIAGNOSIS — R7309 Other abnormal glucose: Secondary | ICD-10-CM

## 2020-04-06 DIAGNOSIS — M85852 Other specified disorders of bone density and structure, left thigh: Secondary | ICD-10-CM | POA: Diagnosis not present

## 2020-04-06 NOTE — Patient Instructions (Addendum)
Ways to prevent diarrhea with magnesium:  1) Don't take all your magnesium at the same time, have 2-3 smaller doses through out the day 2) Try taking your magnesium with high fiber meals.  3) If this does not help, take the magnesium on an empty stomach. Fiber for some people can bind the magnesium too well and prevent absorption in your gut.  4) Lastly try different types of magnesium. Most people are taking magnesium citrate, you can also try dimalate capsules which are slow release. You can also find magnesium lotions/sprays for the skin that bypass the gut. Another one that has good absorption is ReMag (pico-iconic magnesium formula), this has great cellular absorption so less of a laxative effect. You can find these type at health food stores or online.     However if you have worsening HA, changes vision/speech, weakness go to the ER  Home treatments: The Brandt-Daroff Exercises are a home method of treating BPPV,and are effective 95% of the time.  These exercises are performed in three sets per day for two weeks. In each set, one performs the maneuver as shown five times. Start sitting upright (position 1). Then move into the side-lying position (position 2), with the head angled upward about halfway. An easy way to remember this is to imagine someone standing about 6 feet in front of you, and just keep looking at their head at all times. Stay in the side-lying position for 30 seconds, or until the dizziness subsides if this is longer, then go back to the sitting position (position 3). Stay there for 30 seconds, and then go to the opposite side (position 4) and follow the same routine.  At home Epley Maneuver This procedure seems to be even more effective than the in-office procedure, perhaps because it is repeated every night for a week.  The method (for the left side) is performed as shown on the figure below.  1) One stays in each of the supine (lying down) positions for 30 seconds, and  in the sitting upright position (top) for 1 minute.  2) Thus, once cycle takes 2 1/2 minutes.  3) Typically 3 cycles are performed just prior to going to sleep.  4) It is best to do them at night rather than in the morning or midday, as if one becomes dizzy following the exercises, then it can resolve while one is sleeping.  The mirror image of this procedure is used for the right ear.   Vertigo Vertigo is the feeling that you or your surroundings are moving when they are not. This feeling can come and go at any time. Vertigo often goes away on its own. Vertigo can be dangerous if it occurs while you are doing something that could endanger you or others, such as driving or operating machinery. Your health care provider will do tests to determine the cause of your vertigo. Tests will also help your health care provider decide how best to treat your condition. Follow these instructions at home: Eating and drinking      Drink enough fluid to keep your urine pale yellow.  Do not drink alcohol. Activity  Return to your normal activities as told by your health care provider. Ask your health care provider what activities are safe for you.  In the morning, first sit up on the side of the bed. When you feel okay, stand slowly while you hold onto something until you know that your balance is fine.  Move slowly. Avoid sudden body  or head movements or certain positions, as told by your health care provider.  If you have trouble walking or keeping your balance, try using a cane for stability. If you feel dizzy or unstable, sit down right away.  Avoid doing any tasks that would cause danger to you or others if vertigo occurs.  Avoid bending down if you feel dizzy. Place items in your home so that they are easy for you to reach without leaning over.  Do not drive or use heavy machinery if you feel dizzy. General instructions  Take over-the-counter and prescription medicines only as told by your  health care provider.  Keep all follow-up visits as told by your health care provider. This is important. Contact a health care provider if:  Your medicines do not relieve your vertigo or they make it worse.  You have a fever.  Your condition gets worse or you develop new symptoms.  Your family or friends notice any behavioral changes.  Your nausea or vomiting gets worse.  You have numbness or a prickling and tingling sensation in part of your body. Get help right away if you:  Have difficulty moving or speaking.  Are always dizzy.  Faint.  Develop severe headaches.  Have weakness in your hands, arms, or legs.  Have changes in your hearing or vision.  Develop a stiff neck.  Develop sensitivity to light. Summary  Vertigo is the feeling that you or your surroundings are moving when they are not.  Your health care provider will do tests to determine the cause of your vertigo.  Follow instructions for home care. You may be told to avoid certain tasks, positions, or movements.  Contact a health care provider if your medicines do not relieve your symptoms, or if you have a fever, nausea, vomiting, or changes in behavior.  Get help right away if you have severe headaches or difficulty speaking, or you develop hearing or vision problems. This information is not intended to replace advice given to you by your health care provider. Make sure you discuss any questions you have with your health care provider. Document Revised: 05/26/2018 Document Reviewed: 05/26/2018 Elsevier Patient Education  2020 [employer].

## 2020-04-07 LAB — COMPLETE METABOLIC PANEL WITH GFR
AG Ratio: 1.7 (calc) (ref 1.0–2.5)
ALT: 18 U/L (ref 6–29)
AST: 16 U/L (ref 10–35)
Albumin: 4.3 g/dL (ref 3.6–5.1)
Alkaline phosphatase (APISO): 77 U/L (ref 37–153)
BUN: 16 mg/dL (ref 7–25)
CO2: 26 mmol/L (ref 20–32)
Calcium: 9.5 mg/dL (ref 8.6–10.4)
Chloride: 106 mmol/L (ref 98–110)
Creat: 0.74 mg/dL (ref 0.60–0.93)
GFR, Est African American: 90 mL/min/{1.73_m2} (ref >=60)
GFR, Est Non African American: 78 mL/min/{1.73_m2} (ref >=60)
Globulin: 2.5 g/dL (calc) (ref 1.9–3.7)
Glucose, Bld: 93 mg/dL (ref 65–99)
Potassium: 4.5 mmol/L (ref 3.5–5.3)
Sodium: 140 mmol/L (ref 135–146)
Total Bilirubin: 0.4 mg/dL (ref 0.2–1.2)
Total Protein: 6.8 g/dL (ref 6.1–8.1)

## 2020-04-07 LAB — HEMOGLOBIN A1C
Hgb A1c MFr Bld: 5.9 % of total Hgb — ABNORMAL HIGH (ref <5.7)
Mean Plasma Glucose: 123 (calc)
eAG (mmol/L): 6.8 (calc)

## 2020-04-07 LAB — CBC WITH DIFFERENTIAL/PLATELET
Absolute Monocytes: 773 cells/uL (ref 200–950)
Basophils Absolute: 83 cells/uL (ref 0–200)
Basophils Relative: 1.1 %
Eosinophils Absolute: 233 cells/uL (ref 15–500)
Eosinophils Relative: 3.1 %
HCT: 41.9 % (ref 35.0–45.0)
Hemoglobin: 13.7 g/dL (ref 11.7–15.5)
Lymphs Abs: 2213 cells/uL (ref 850–3900)
MCH: 27.8 pg (ref 27.0–33.0)
MCHC: 32.7 g/dL (ref 32.0–36.0)
MCV: 85.2 fL (ref 80.0–100.0)
MPV: 10.2 fL (ref 7.5–12.5)
Monocytes Relative: 10.3 %
Neutro Abs: 4200 cells/uL (ref 1500–7800)
Neutrophils Relative %: 56 %
Platelets: 289 10*3/uL (ref 140–400)
RBC: 4.92 10*6/uL (ref 3.80–5.10)
RDW: 13.6 % (ref 11.0–15.0)
Total Lymphocyte: 29.5 %
WBC: 7.5 10*3/uL (ref 3.8–10.8)

## 2020-04-07 LAB — LIPID PANEL
Cholesterol: 171 mg/dL (ref <200)
HDL: 41 mg/dL — ABNORMAL LOW (ref >=50)
LDL Cholesterol (Calc): 102 mg/dL (calc) — ABNORMAL HIGH
Non-HDL Cholesterol (Calc): 130 mg/dL (calc) — ABNORMAL HIGH (ref <130)
Total CHOL/HDL Ratio: 4.2 (calc) (ref <5.0)
Triglycerides: 161 mg/dL — ABNORMAL HIGH (ref <150)

## 2020-04-07 LAB — TSH: TSH: 3.39 mIU/L (ref 0.40–4.50)

## 2020-04-07 LAB — MAGNESIUM: Magnesium: 2 mg/dL (ref 1.5–2.5)

## 2020-04-21 DIAGNOSIS — Z85828 Personal history of other malignant neoplasm of skin: Secondary | ICD-10-CM | POA: Diagnosis not present

## 2020-04-21 DIAGNOSIS — L570 Actinic keratosis: Secondary | ICD-10-CM | POA: Diagnosis not present

## 2020-04-21 DIAGNOSIS — L814 Other melanin hyperpigmentation: Secondary | ICD-10-CM | POA: Diagnosis not present

## 2020-04-21 DIAGNOSIS — D692 Other nonthrombocytopenic purpura: Secondary | ICD-10-CM | POA: Diagnosis not present

## 2020-04-21 DIAGNOSIS — L821 Other seborrheic keratosis: Secondary | ICD-10-CM | POA: Diagnosis not present

## 2020-04-21 DIAGNOSIS — D229 Melanocytic nevi, unspecified: Secondary | ICD-10-CM | POA: Diagnosis not present

## 2020-04-21 DIAGNOSIS — L910 Hypertrophic scar: Secondary | ICD-10-CM | POA: Diagnosis not present

## 2020-04-29 DIAGNOSIS — H35371 Puckering of macula, right eye: Secondary | ICD-10-CM | POA: Diagnosis not present

## 2020-04-29 DIAGNOSIS — H2513 Age-related nuclear cataract, bilateral: Secondary | ICD-10-CM | POA: Diagnosis not present

## 2020-04-29 DIAGNOSIS — H524 Presbyopia: Secondary | ICD-10-CM | POA: Diagnosis not present

## 2020-05-02 ENCOUNTER — Other Ambulatory Visit: Payer: Self-pay

## 2020-05-02 ENCOUNTER — Ambulatory Visit (INDEPENDENT_AMBULATORY_CARE_PROVIDER_SITE_OTHER): Payer: Medicare Other

## 2020-05-02 VITALS — BP 122/86 | Temp 97.6°F | Wt 151.0 lb

## 2020-05-02 DIAGNOSIS — Z23 Encounter for immunization: Secondary | ICD-10-CM | POA: Diagnosis not present

## 2020-05-02 NOTE — Progress Notes (Signed)
PATIENT was made aware that her insurance may not cover the TDAP.  Patient states she has a NEW GREAT GRANDCHILD that was born 3wks ago & would like this vaccine even if it will cast out of pocket.  Vaccine given & pt agreed & voiced understanding.

## 2020-05-13 DIAGNOSIS — H0011 Chalazion right upper eyelid: Secondary | ICD-10-CM | POA: Diagnosis not present

## 2020-05-18 DIAGNOSIS — Z23 Encounter for immunization: Secondary | ICD-10-CM | POA: Diagnosis not present

## 2020-07-11 ENCOUNTER — Encounter: Payer: Self-pay | Admitting: Internal Medicine

## 2020-07-11 NOTE — Patient Instructions (Signed)

## 2020-07-11 NOTE — Progress Notes (Signed)
Comprehensive Evaluation &  Examination      This very nice 79 y.o. MWF presents for a  comprehensive evaluation and management of multiple medical co-morbidities.  Patient has been followed for HTN, HLD, Prediabetes, Hypothyroidism   and Vitamin D Deficiency.  Patient has hx/o GERD controlled with diet & OTC antacids.       Patient has been followed expectantly for Labile HTN since  2008. Patient's BP has been controlled at home and patient denies any cardiac symptoms as chest pain, palpitations, shortness of breath, dizziness or ankle swelling. Today's BP is at goal - 122/64.       Patient's hyperlipidemia is near controlled with diet. Last lipids were almost to goal:  Lab Results  Component Value Date   CHOL 171 04/06/2020   HDL 41 (L) 04/06/2020   LDLCALC 102 (H) 04/06/2020   TRIG 161 (H) 04/06/2020   CHOLHDL 4.2 04/06/2020        Patient has hx/o prediabetes (A1c 6.2% /2011) and patient denies reactive hypoglycemic symptoms, visual blurring, diabetic polys or paresthesias. Last A1c was not at goal:  Lab Results  Component Value Date   HGBA1C 5.9 (H) 04/06/2020        Patient was dx'd Hypothyroid in 2014 &  was initiated on thyroid replacement.       Finally, patient has history of Vitamin D Deficiency ("13"/2008) and last Vitamin D was at  goal:  Lab Results  Component Value Date   VD25OH 67 12/31/2019    Current Outpatient Medications on File Prior to Visit  Medication Sig  . VITAMIN C  Take daily.  Marland Kitchen aspirin 81 MG tablet aily.  Marland Kitchen VITAMIN D  Take 10,000 Int'l Units  daily.  . Ferrous Sulfate (IRON  Take daily  . LIDEX) 0.05 % as needed.   Marland Kitchen LEVSIN/SL 0.125 MG under tongue every 4  hrs as needed -abdominal cramping/diarrhea.  Javier Docker Oil 1000 MG  Take  daily.   Marland Kitchen levothyroxine 50 MCG t Take 1/2 to 1  tablet daily   . [facility] brand allergy pill daily.   Marland Kitchen zinc 50 MG tablet Take 50 mg  daily.    No Known Allergies  Past Medical History:  Diagnosis Date  .  Arthritis   . Hyperlipidemia   . Hypertension   . Prediabetes   . Thyroid disease   . Vitamin D deficiency    Health Maintenance  Topic Date Due  . Hepatitis C Screening  Never done  . COVID-19 Vaccine (3 - Booster for Moderna series) 02/21/2020  . TETANUS/TDAP  05/02/2030  . INFLUENZA VACCINE  Completed  . DEXA SCAN  Completed  . PNA vac Low Risk Adult  Completed   Immunization History  Administered Date(s) Administered  . DT (Pediatric) 04/08/2014  . Influenza, High Dose Seasonal PF 04/18/2015, 05/15/2016, 05/22/2017, 06/20/2018, 04/21/2019, 05/02/2020  . Influenza-Unspecified 04/21/2019  . Moderna Sars-Covid-2 Vaccination 07/27/2019, 08/24/2019  . Pneumococcal Conjugate-13 05/15/2016  . Pneumococcal Polysaccharide-23 11/18/2006, 04/08/2014  . Td 07/16/2002  . Tdap 05/02/2020  . Zoster 01/29/2007    Last Colon - 06/30/2008Deatra Ina - recc 10 yr f/u   Cologard 08/03/2017 - 3 year f/u due Feb 2022  Last MGM - 08/13/2019  Last dexaBMD - 05/13/2019 t-2.1  / Osteopenia  / recc f/u BMD in 2 years   Past Surgical History:  Procedure Laterality Date  . APPENDECTOMY    . OTHER SURGICAL HISTORY  1981   BTL   . TONSILLECTOMY  Family History  Problem Relation Age of Onset  . Heart disease Mother   . Asthma Mother   . COPD Father   . Heart disease Father   . Gout Father   . Cancer Brother   . Breast cancer Neg Hx    Social History   Tobacco Use  . Smoking status: Never Smoker  . Smokeless tobacco: Never Used  Substance Use Topics  . Alcohol use: Yes    Comment: rarely  . Drug use: No    ROS Constitutional: Denies fever, chills, weight loss/gain, headaches, insomnia,  night sweats, and change in appetite. Does c/o fatigue. Eyes: Denies redness, blurred vision, diplopia, discharge, itchy, watery eyes.  ENT: Denies discharge, congestion, post nasal drip, epistaxis, sore throat, earache, hearing loss, dental pain, Tinnitus, Vertigo, Sinus pain, snoring.   Cardio: Denies chest pain, palpitations, irregular heartbeat, syncope, dyspnea, diaphoresis, orthopnea, PND, claudication, edema Respiratory: denies cough, dyspnea, DOE, pleurisy, hoarseness, laryngitis, wheezing.  Gastrointestinal: Denies dysphagia, heartburn, reflux, water brash, pain, cramps, nausea, vomiting, bloating, diarrhea, constipation, hematemesis, melena, hematochezia, jaundice, hemorrhoids Genitourinary: Denies dysuria, frequency, urgency, nocturia, hesitancy, discharge, hematuria, flank pain Breast: Breast lumps, nipple discharge, bleeding.  Musculoskeletal: Denies arthralgia, myalgia, stiffness, Jt. Swelling, pain, limp, and strain/sprain. Denies falls. Skin: Denies puritis, rash, hives, warts, acne, eczema, changing in skin lesion Neuro: No weakness, tremor, incoordination, spasms, paresthesia, pain Psychiatric: Denies confusion, memory loss, sensory loss. Denies Depression. Endocrine: Denies change in weight, skin, hair change, nocturia, and paresthesia, diabetic polys, visual blurring, hyper / hypo glycemic episodes.  Heme/Lymph: No excessive bleeding, bruising, enlarged lymph nodes.  Physical Exam  BP 122/64   Pulse 71   Temp (!) 97.2 F (36.2 C)   Resp 16   Ht 5\' 2"  (1.575 m)   Wt 148 lb 9.6 oz (67.4 kg)   SpO2 98%   BMI 27.18 kg/m   General Appearance: Well nourished, well groomed and in no apparent distress.  Eyes: PERRLA, EOMs, conjunctiva no swelling or erythema, normal fundi and vessels. Sinuses: No frontal/maxillary tenderness ENT/Mouth: EACs patent / TMs  nl. Nares clear without erythema, swelling, mucoid exudates. Oral hygiene is good. No erythema, swelling, or exudate. Tongue normal, non-obstructing. Tonsils not swollen or erythematous. Hearing normal.  Neck: Supple, thyroid not palpable. No bruits, nodes or JVD. Respiratory: Respiratory effort normal.  BS equal and clear bilateral without rales, rhonci, wheezing or stridor. Cardio: Heart sounds are  normal with regular rate and rhythm and no murmurs, rubs or gallops. Peripheral pulses are normal and equal bilaterally without edema. No aortic or femoral bruits. Chest: symmetric with normal excursions and percussion. Breasts: Symmetric, without lumps, nipple discharge, retractions, or fibrocystic changes.  Abdomen: Flat, soft with bowel sounds active. Nontender, no guarding, rebound, hernias, masses, or organomegaly.  Lymphatics: Non tender without lymphadenopathy.  Genitourinary:  Musculoskeletal: Full ROM all peripheral extremities, joint stability, 5/5 strength, and normal gait. Skin: Warm and dry without rashes, lesions, cyanosis, clubbing or  ecchymosis.  Neuro: Cranial nerves intact, reflexes equal bilaterally. Normal muscle tone, no cerebellar symptoms. Sensation intact.  Pysch: Alert and oriented X 3, normal affect, Insight and Judgment appropriate.   Assessment and Plan   1. Labile hypertension  - EKG 12-Lead - Urinalysis, Routine w reflex microscopic - Microalbumin / creatinine urine ratio - CBC with Differential/Platelet - COMPLETE METABOLIC PANEL WITH GFR - Magnesium - TSH  2. Hyperlipidemia, mixed  - EKG 12-Lead - Lipid panel - TSH  3. Abnormal glucose  - EKG 12-Lead - Hemoglobin  A1c - Insulin, random  4. Vitamin D deficiency  - VITAMIN D 25 Hydroxy   5. Prediabetes  - EKG 12-Lead - Hemoglobin A1c - Insulin, random  6. Hypothyroidism, unspecified type  - TSH  7. Screening for ischemic heart disease  - EKG 12-Lead  8. FHx: heart disease  - EKG 12-Lead  9. Screening for colorectal cancer  - POC Hemoccult Bld/Stl 10. Medication management  - Urinalysis, Routine w reflex microscopic - Microalbumin / creatinine urine ratio - CBC with Differential/Platelet - COMPLETE METABOLIC PANEL WITH GFR - Magnesium - Lipid panel - TSH - Hemoglobin A1c - Insulin, random - VITAMIN D 25 Hydroxy           Patient was counseled in prudent diet to  achieve/maintain BMI less than 25 for weight control, BP monitoring, regular exercise and medications. Discussed med's effects and SE's. Screening labs and tests as requested with regular follow-up as recommended. Over 40 minutes of exam, counseling, chart review and high complex critical decision making was performed.   Kirtland Bouchard, MD

## 2020-07-12 ENCOUNTER — Other Ambulatory Visit: Payer: Self-pay

## 2020-07-12 ENCOUNTER — Ambulatory Visit (INDEPENDENT_AMBULATORY_CARE_PROVIDER_SITE_OTHER): Payer: Medicare Other | Admitting: Internal Medicine

## 2020-07-12 VITALS — BP 122/64 | HR 71 | Temp 97.2°F | Resp 16 | Ht 62.0 in | Wt 148.6 lb

## 2020-07-12 DIAGNOSIS — Z1212 Encounter for screening for malignant neoplasm of rectum: Secondary | ICD-10-CM

## 2020-07-12 DIAGNOSIS — Z79899 Other long term (current) drug therapy: Secondary | ICD-10-CM

## 2020-07-12 DIAGNOSIS — E782 Mixed hyperlipidemia: Secondary | ICD-10-CM

## 2020-07-12 DIAGNOSIS — E559 Vitamin D deficiency, unspecified: Secondary | ICD-10-CM | POA: Diagnosis not present

## 2020-07-12 DIAGNOSIS — R7303 Prediabetes: Secondary | ICD-10-CM

## 2020-07-12 DIAGNOSIS — R7309 Other abnormal glucose: Secondary | ICD-10-CM

## 2020-07-12 DIAGNOSIS — R0989 Other specified symptoms and signs involving the circulatory and respiratory systems: Secondary | ICD-10-CM

## 2020-07-12 DIAGNOSIS — Z8249 Family history of ischemic heart disease and other diseases of the circulatory system: Secondary | ICD-10-CM

## 2020-07-12 DIAGNOSIS — Z1211 Encounter for screening for malignant neoplasm of colon: Secondary | ICD-10-CM

## 2020-07-12 DIAGNOSIS — E039 Hypothyroidism, unspecified: Secondary | ICD-10-CM

## 2020-07-12 DIAGNOSIS — Z136 Encounter for screening for cardiovascular disorders: Secondary | ICD-10-CM

## 2020-07-13 LAB — HEMOGLOBIN A1C
Hgb A1c MFr Bld: 6 % of total Hgb — ABNORMAL HIGH (ref <5.7)
Mean Plasma Glucose: 126 mg/dL
eAG (mmol/L): 7 mmol/L

## 2020-07-13 LAB — URINALYSIS, ROUTINE W REFLEX MICROSCOPIC
Bacteria, UA: NONE SEEN /HPF
Bilirubin Urine: NEGATIVE
Glucose, UA: NEGATIVE
Hgb urine dipstick: NEGATIVE
Hyaline Cast: NONE SEEN /LPF
Ketones, ur: NEGATIVE
Nitrite: NEGATIVE
Protein, ur: NEGATIVE
Specific Gravity, Urine: 1.023 (ref 1.001–1.03)
Squamous Epithelial / HPF: NONE SEEN /HPF (ref <=5)
pH: 6 (ref 5.0–8.0)

## 2020-07-13 LAB — CBC WITH DIFFERENTIAL/PLATELET
Absolute Monocytes: 521 cells/uL (ref 200–950)
Basophils Absolute: 71 cells/uL (ref 0–200)
Basophils Relative: 0.9 %
Eosinophils Absolute: 221 cells/uL (ref 15–500)
Eosinophils Relative: 2.8 %
HCT: 43.5 % (ref 35.0–45.0)
Hemoglobin: 14.4 g/dL (ref 11.7–15.5)
Lymphs Abs: 2789 cells/uL (ref 850–3900)
MCH: 27.9 pg (ref 27.0–33.0)
MCHC: 33.1 g/dL (ref 32.0–36.0)
MCV: 84.1 fL (ref 80.0–100.0)
MPV: 10.1 fL (ref 7.5–12.5)
Monocytes Relative: 6.6 %
Neutro Abs: 4298 cells/uL (ref 1500–7800)
Neutrophils Relative %: 54.4 %
Platelets: 309 10*3/uL (ref 140–400)
RBC: 5.17 10*6/uL — ABNORMAL HIGH (ref 3.80–5.10)
RDW: 12.9 % (ref 11.0–15.0)
Total Lymphocyte: 35.3 %
WBC: 7.9 10*3/uL (ref 3.8–10.8)

## 2020-07-13 LAB — COMPLETE METABOLIC PANEL WITH GFR
AG Ratio: 1.5 (calc) (ref 1.0–2.5)
ALT: 17 U/L (ref 6–29)
AST: 16 U/L (ref 10–35)
Albumin: 4.2 g/dL (ref 3.6–5.1)
Alkaline phosphatase (APISO): 79 U/L (ref 37–153)
BUN: 16 mg/dL (ref 7–25)
CO2: 31 mmol/L (ref 20–32)
Calcium: 9.8 mg/dL (ref 8.6–10.4)
Chloride: 103 mmol/L (ref 98–110)
Creat: 0.77 mg/dL (ref 0.60–0.93)
GFR, Est African American: 86 mL/min/{1.73_m2} (ref >=60)
GFR, Est Non African American: 74 mL/min/{1.73_m2} (ref >=60)
Globulin: 2.8 g/dL (calc) (ref 1.9–3.7)
Glucose, Bld: 128 mg/dL — ABNORMAL HIGH (ref 65–99)
Potassium: 4.3 mmol/L (ref 3.5–5.3)
Sodium: 140 mmol/L (ref 135–146)
Total Bilirubin: 0.4 mg/dL (ref 0.2–1.2)
Total Protein: 7 g/dL (ref 6.1–8.1)

## 2020-07-13 LAB — TSH: TSH: 4.03 mIU/L (ref 0.40–4.50)

## 2020-07-13 LAB — LIPID PANEL
Cholesterol: 187 mg/dL (ref <200)
HDL: 43 mg/dL — ABNORMAL LOW (ref >=50)
LDL Cholesterol (Calc): 119 mg/dL (calc) — ABNORMAL HIGH
Non-HDL Cholesterol (Calc): 144 mg/dL (calc) — ABNORMAL HIGH (ref <130)
Total CHOL/HDL Ratio: 4.3 (calc) (ref <5.0)
Triglycerides: 133 mg/dL (ref <150)

## 2020-07-13 LAB — MAGNESIUM: Magnesium: 2 mg/dL (ref 1.5–2.5)

## 2020-07-13 LAB — MICROALBUMIN / CREATININE URINE RATIO
Creatinine, Urine: 134 mg/dL (ref 20–275)
Microalb Creat Ratio: 4 mcg/mg creat (ref <30)
Microalb, Ur: 0.6 mg/dL

## 2020-07-13 LAB — INSULIN, RANDOM: Insulin: 53.7 u[IU]/mL — ABNORMAL HIGH

## 2020-07-13 LAB — VITAMIN D 25 HYDROXY (VIT D DEFICIENCY, FRACTURES): Vit D, 25-Hydroxy: 78 ng/mL (ref 30–100)

## 2020-07-13 NOTE — Progress Notes (Signed)
========================================================== -   Test results slightly outside the reference range are not unusual. If there is anything important, I will review this with you,  otherwise it is considered normal test values.  If you have further questions,  please do not hesitate to contact me at the office or via My Chart.  ==========================================================  -  Total chol = 187 - is  sl elevated, but   - Bad / Dangerous LDL Chol = 119 - elevated (Ideal or Goal is less than 70  !  )  - Recommend  a stricter low cholesterol diet   - Cholesterol only comes from animal sources  - ie. meat, dairy, egg yolks  - Eat all the vegetables you want.  - Avoid meat, especially red meat - Beef AND Pork .  - Avoid cheese & dairy - milk & ice cream.     - Cheese is the most concentrated form of trans-fats which  is the worst thing to clog up our arteries.   - Veggie cheese is OK which can be found in the fresh  produce section at [employer] or [employer] or [employer] ========================================================== ==========================================================  -  A1c = 6.0% - (12 week average blood Sugar) is too high (Ideal or Goal is less than 5.7%)   - So . . . . . Blood sugar and A1c are elevated in the borderline and  early or pre-diabetes range which has the same   300% increased risk for heart attack, stroke, cancer and   alzheimer- type vascular dementia as full blown diabetes.   But the good news is that diet, exercise with  weight loss can cure the early diabetes at this point. ==========================================================  -  It is very important that you work harder with diet by  avoiding all foods that are white except chicken,   fish & calliflower.  - Avoid white rice  (brown & wild rice is OK),   - Avoid white potatoes  (sweet potatoes in moderation is OK),   White bread or wheat bread  or anything made out of   white flour like bagels, donuts, rolls, buns, biscuits, cakes,  - pastries, cookies, pizza crust, and pasta (made from  white flour & egg whites)   - vegetarian pasta or spinach or wheat pasta is OK.  - Multigrain breads like Arnold's, Pepperidge Farm or   multigrain sandwich thins or high fiber breads like   Eureka bread or "Dave's Killer" breads that are  4 to 5 grams fiber per slice !  are best.    Diet, exercise and weight loss can reverse and cure  diabetes in the early stages.   ==========================================================  -  Vitamin D = 78 -  Excellent  ==========================================================  -  All Else - CBC - Kidneys - Electrolytes - Liver - Magnesium & Thyroid    - all  Normal / OK ===========================================================  ==========================================================

## 2020-07-25 ENCOUNTER — Other Ambulatory Visit: Payer: Self-pay | Admitting: Internal Medicine

## 2020-07-25 DIAGNOSIS — Z1231 Encounter for screening mammogram for malignant neoplasm of breast: Secondary | ICD-10-CM

## 2020-07-25 DIAGNOSIS — Z1211 Encounter for screening for malignant neoplasm of colon: Secondary | ICD-10-CM

## 2020-07-29 DIAGNOSIS — Z1211 Encounter for screening for malignant neoplasm of colon: Secondary | ICD-10-CM | POA: Diagnosis not present

## 2020-07-29 DIAGNOSIS — Z1212 Encounter for screening for malignant neoplasm of rectum: Secondary | ICD-10-CM | POA: Diagnosis not present

## 2020-07-30 LAB — COLOGUARD: Cologuard: NEGATIVE

## 2020-08-09 ENCOUNTER — Other Ambulatory Visit: Payer: Self-pay

## 2020-08-09 DIAGNOSIS — Z1211 Encounter for screening for malignant neoplasm of colon: Secondary | ICD-10-CM

## 2020-08-09 LAB — POC HEMOCCULT BLD/STL (HOME/3-CARD/SCREEN)
Card #2 Fecal Occult Blod, POC: NEGATIVE
Card #3 Fecal Occult Blood, POC: NEGATIVE
Fecal Occult Blood, POC: NEGATIVE

## 2020-08-10 DIAGNOSIS — Z1211 Encounter for screening for malignant neoplasm of colon: Secondary | ICD-10-CM | POA: Diagnosis not present

## 2020-08-10 DIAGNOSIS — Z1212 Encounter for screening for malignant neoplasm of rectum: Secondary | ICD-10-CM | POA: Diagnosis not present

## 2020-08-11 LAB — EXTERNAL GENERIC LAB PROCEDURE: COLOGUARD: NEGATIVE

## 2020-08-11 LAB — COLOGUARD: COLOGUARD: NEGATIVE

## 2020-08-12 ENCOUNTER — Encounter: Payer: Self-pay | Admitting: Internal Medicine

## 2020-08-26 ENCOUNTER — Ambulatory Visit
Admission: RE | Admit: 2020-08-26 | Discharge: 2020-08-26 | Disposition: A | Discharge status: Home / Self Care | Payer: PPO | Source: Ambulatory Visit | Attending: Internal Medicine | Admitting: Internal Medicine

## 2020-08-26 ENCOUNTER — Other Ambulatory Visit: Payer: Self-pay

## 2020-08-26 DIAGNOSIS — Z1231 Encounter for screening mammogram for malignant neoplasm of breast: Secondary | ICD-10-CM | POA: Diagnosis not present

## 2020-09-02 ENCOUNTER — Ambulatory Visit: Payer: Medicare Other

## 2020-09-10 ENCOUNTER — Other Ambulatory Visit: Payer: Self-pay | Admitting: Internal Medicine

## 2020-10-10 ENCOUNTER — Ambulatory Visit: Payer: Medicare Other | Admitting: Adult Health

## 2020-10-11 ENCOUNTER — Ambulatory Visit: Payer: Medicare Other | Admitting: Adult Health

## 2020-10-26 NOTE — Progress Notes (Signed)
MEDICARE ANNUAL WELLNESS VISIT AND FOLLOW UP  Assessment:   Diagnoses and all orders for this visit:  Encounter for Medicare annual wellness exam Due annually   Essential hypertension Labile; currently managed without medications - will continue to monitor and initiate treatment for persistently elevated BPs as indicated Monitor blood pressure at home; call if consistently over 130/80 Continue DASH diet.   Reminder to go to the ER if any CP, SOB, nausea, dizziness, severe HA, changes vision/speech, left arm numbness and tingling and jaw pain.  Hypothyroidism, unspecified type continue medications the same reminded to take on an empty stomach 30-63mins before food.  -     TSH  Mixed hyperlipidemia At goal; continue medications krill oil supplement Continue low cholesterol diet and exercise.  Defer lipid panel  Vitamin D deficiency Continue supplementation Defer checking vitamin D level  Prediabetes Discussed disease and risks Discussed diet/exercise, weight management  A1C q98m;   Medication management -     CBC with Differential/Platelet -     CMP/GFR  Osteopenia Repeat dexa after 04/2021, order placed to schedule with next mammogram, continue Vit D and Ca, reviewed weight bearing exercises  Overweight  Long discussion about weight loss, diet, and exercise Recommended diet heavy in fruits and veggies and low in animal meats, cheeses, and dairy products, appropriate calorie intake Follow up in 6 months   Over 40 minutes of exam, counseling, chart review and critical decision making was performed Future Appointments  Date Time Provider [place]  07/27/2021  2:00 PM Unk Pinto, MD [facility] None  10/30/2021  3:30 PM Liane Comber, NP [facility] None     Plan:   During the course of the visit the patient was educated and counseled about appropriate screening and preventive services including:    Pneumococcal vaccine   Prevnar 13  Influenza  vaccine  Td vaccine  Screening electrocardiogram  Bone densitometry screening  Colorectal cancer screening  Diabetes screening  Glaucoma screening  Nutrition counseling   Advanced directives: requested   Subjective:  Anna Caldwell is a remarkably healthy 80 y.o. female who presents for Medicare Annual Wellness Visit and 3 month follow up.   she has a diagnosis of GERD which is currently managed by lifestyle modification  she reports symptoms is currently well controlled, and denies breakthrough reflux, burning in chest, hoarseness or cough.    BMI is Body mass index is 27.44 kg/m., she has been working on diet and exercise, will walk some on her property but not consistently, does a lot of garden.  Wt Readings from Last 3 Encounters:  10/27/20 150 lb (68 kg)  07/12/20 148 lb 9.6 oz (67.4 kg)  05/02/20 151 lb (68.5 kg)    She does have BP cuff, hasn't been checking, today their BP is BP: 116/68 She does workout. She denies chest pain, shortness of breath, dizziness. She reports has brief sensation of fluttering in chest when feeling anxious, has had for decades, no changes, denies accompaniments, recently rare.   She is not on cholesterol medication and denies myalgias. Her cholesterol is not at goal. The cholesterol last visit was:   Lab Results  Component Value Date   CHOL 187 07/12/2020   HDL 43 (L) 07/12/2020   LDLCALC 119 (H) 07/12/2020   TRIG 133 07/12/2020   CHOLHDL 4.3 07/12/2020    She has been working on diet and exercise for prediabetes, and denies increased appetite, nausea, paresthesia of the feet, polydipsia, polyuria, visual disturbances and vomiting. Last A1C  in the office was back within normal range:  Lab Results  Component Value Date   HGBA1C 6.0 (H) 07/12/2020   Last GFR: Lab Results  Component Value Date   GFRNONAA 74 07/12/2020   She is on thyroid medication. Her medication was not changed last visit. She reports taking 1/2 tab of 50  mcg daily.  Lab Results  Component Value Date   TSH 4.03 07/12/2020   Patient is on Vitamin D supplement and at goal at the last visit:    Lab Results  Component Value Date   VD25OH 78 07/12/2020      Medication Review: Current Outpatient Medications on File Prior to Visit  Medication Sig Dispense Refill  . aspirin 81 MG chewable tablet Chew by mouth daily.    . Cholecalciferol (VITAMIN D PO) Take 10,000 Int'l Units by mouth daily.    . fluocinonide cream (LIDEX) 0.05 % as needed.     Javier Docker Oil 1000 MG CAPS Take by mouth daily.     Marland Kitchen levothyroxine (SYNTHROID) 50 MCG tablet Take 1/2 to 1 tablet daily as directed on an empty stomach with only water for 30 minutes & no Antacid meds, Calcium or Magnesium for 4 hours & avoid Biotin 90 tablet 1  . OVER THE COUNTER MEDICATION daily. [facility] brand allergy pill    . Zinc 50 MG CAPS Take by mouth daily.     No current facility-administered medications on file prior to visit.    No Known Allergies  Current Problems (verified) Patient Active Problem List   Diagnosis Date Noted  . Osteopenia 05/13/2019  . Overweight (BMI 25.0-29.9) 04/18/2015  . Medication management 06/22/2013  . Hypertension   . Hyperlipidemia   . Vitamin D deficiency   . Hypothyroidism   . Abnormal glucose     Screening Tests Immunization History  Administered Date(s) Administered  . DT (Pediatric) 04/08/2014  . Influenza, High Dose Seasonal PF 04/18/2015, 05/15/2016, 05/22/2017, 06/20/2018, 04/21/2019, 05/02/2020  . Influenza-Unspecified 04/21/2019  . Moderna SARS-COV2 Booster Vaccination 10/24/2020  . Moderna Sars-Covid-2 Vaccination 07/27/2019, 08/24/2019, 05/18/2020  . Pneumococcal Conjugate-13 05/15/2016  . Pneumococcal Polysaccharide-23 11/18/2006, 04/08/2014  . Td 07/16/2002  . Tdap 05/02/2020  . Zoster 01/29/2007   Preventative care: Last colonoscopy: 12/2006  Last cologuard: 07/2020 neg Last mammogram: 08/26/2020 Last pap smear/pelvic exam: 2011,  will not have another DEXA: 04/2019 L fem T -2.1 ordered to schedule with next mammogram   Prior vaccinations: TD or Tdap: 2015  Influenza: 04/2020 Pneumococcal: 2015 Prevnar13: 2017 Shingles/Zostavax: 2008 Covid 19: 4/4, moderna, 2021  Names of Other Physician/Practitioners you currently use: 1. [facility] here for primary care 2. [facility], Dr. Valetta Close, eye doctor, last visit 2021, monitoring mild cataracts 3. [facility], Dr. Kary Kos, dentist, last visit 2021, has scheduled in 2 weeks 4. Dr. Ubaldo Glassing, derm, 08/2019  Patient Care Team: Unk Pinto, MD as PCP - General (Internal Medicine) Inda Castle, MD (Inactive) as Consulting Physician (Gastroenterology) Marygrace Drought, MD as Consulting Physician (Ophthalmology) Rolm Bookbinder, MD as Consulting Physician (Dermatology)  SURGICAL HISTORY She  has a past surgical history that includes Appendectomy; Tonsillectomy; and Other surgical history (1981). FAMILY HISTORY Her family history includes Asthma in her mother; COPD in her father; Cancer in her brother; Gout in her father; Heart disease in her father and mother. SOCIAL HISTORY She  reports that she has never smoked. She has never used smokeless tobacco. She reports current alcohol use. She reports that she does  not use drugs.   MEDICARE WELLNESS OBJECTIVES: Physical activity: Current Exercise Habits: Home exercise routine, Type of exercise: walking, Time (Minutes): 20, Frequency (Times/Week): 2, Weekly Exercise (Minutes/Week): 40, Intensity: Mild, Exercise limited by: None identified Cardiac risk factors: Cardiac Risk Factors include: advanced age (>57men, >74 women);dyslipidemia;hypertension;sedentary lifestyle Depression/mood screen:   Depression screen [facility] 2/9 10/27/2020  Decreased Interest 0  Down, Depressed, Hopeless 0  PHQ - 2 Score 0    ADLs:  In your present state of health, do you have any difficulty  performing the following activities: 10/27/2020 07/11/2020  Hearing? N N  Vision? N N  Difficulty concentrating or making decisions? N N  Walking or climbing stairs? N N  Dressing or bathing? N N  Doing errands, shopping? N N  Some recent data might be hidden     Cognitive Testing  Alert? Yes  Normal Appearance?Yes  Oriented to person? Yes  Place? Yes   Time? Yes  Recall of three objects?  Yes  Can perform simple calculations? Yes  Displays appropriate judgment?Yes  Can read the correct time from a watch face?Yes  EOL planning: Does Patient Have a Medical Advance Directive?: Yes Type of Advance Directive: [place] will Does patient want to make changes to medical advance directive?: No - Patient declined Copy of [place] in Chart?: Yes - validated most recent copy scanned in chart (See row information)  Review of Systems  Constitutional: Negative for malaise/fatigue and weight loss.  HENT: Negative for hearing loss and tinnitus.   Eyes: Negative for blurred vision and double vision.  Respiratory: Negative for cough, shortness of breath and wheezing.   Cardiovascular: Negative for chest pain, palpitations, orthopnea, claudication and leg swelling.  Gastrointestinal: Negative for abdominal pain, blood in stool, constipation, diarrhea, heartburn, melena, nausea and vomiting.  Genitourinary: Negative.   Musculoskeletal: Negative for joint pain and myalgias.  Skin: Negative for rash.  Neurological: Negative for dizziness, tingling, sensory change, weakness and headaches.  Endo/Heme/Allergies: Negative for environmental allergies and polydipsia.  Psychiatric/Behavioral: Negative.  Negative for depression. The patient is not nervous/anxious and does not have insomnia.   All other systems reviewed and are negative.    Objective:     Today's Vitals   10/27/20 1543  BP: 116/68  Pulse: 63  Temp: 97.9 F (36.6 C)  SpO2: 95%  Weight:  150 lb (68 kg)   Body mass index is 27.44 kg/m.  General appearance: alert, no distress, WD/WN, female HEENT: normocephalic, sclerae anicteric, TMs pearly, nares patent, no discharge or erythema, pharynx normal Oral cavity: MMM, no lesions Neck: supple, no lymphadenopathy, no thyromegaly, no masses Heart: RRR, normal S1, S2, no murmurs Lungs: CTA bilaterally, no wheezes, rhonchi, or rales Abdomen: +bs, soft, non tender, non distended, no masses, no hepatomegaly, no splenomegaly Musculoskeletal: nontender, no swelling; she does bony arthritic changes to bil hand DIP joints with mild deviation deformity.  Extremities: no edema, no cyanosis, no clubbing Pulses: 2+ symmetric, upper and lower extremities, normal cap refill Neurological: alert, oriented x 3, CN2-12 intact, strength normal upper extremities and lower extremities, sensation normal throughout, DTRs 2+ throughout, no cerebellar signs, gait normal Psychiatric: normal affect, behavior normal, pleasant   Medicare Attestation I have personally reviewed: The patient's medical and social history Their use of alcohol, tobacco or illicit drugs Their current medications and supplements The patient's functional ability including ADLs,fall risks, home safety risks, cognitive, and hearing and visual impairment Diet and physical activities Evidence for depression or  mood disorders  The patient's weight, height, BMI, and visual acuity have been recorded in the chart.  I have made referrals, counseling, and provided education to the patient based on review of the above and I have provided the patient with a written personalized care plan for preventive services.     Izora Ribas, NP   10/27/2020

## 2020-10-27 ENCOUNTER — Encounter: Payer: Self-pay | Admitting: Adult Health

## 2020-10-27 ENCOUNTER — Ambulatory Visit (INDEPENDENT_AMBULATORY_CARE_PROVIDER_SITE_OTHER): Payer: PPO | Admitting: Adult Health

## 2020-10-27 ENCOUNTER — Other Ambulatory Visit: Payer: Self-pay

## 2020-10-27 VITALS — BP 116/68 | HR 63 | Temp 97.9°F | Wt 150.0 lb

## 2020-10-27 DIAGNOSIS — I10 Essential (primary) hypertension: Secondary | ICD-10-CM | POA: Diagnosis not present

## 2020-10-27 DIAGNOSIS — E039 Hypothyroidism, unspecified: Secondary | ICD-10-CM

## 2020-10-27 DIAGNOSIS — E559 Vitamin D deficiency, unspecified: Secondary | ICD-10-CM

## 2020-10-27 DIAGNOSIS — M85852 Other specified disorders of bone density and structure, left thigh: Secondary | ICD-10-CM

## 2020-10-27 DIAGNOSIS — Z79899 Other long term (current) drug therapy: Secondary | ICD-10-CM

## 2020-10-27 DIAGNOSIS — E782 Mixed hyperlipidemia: Secondary | ICD-10-CM | POA: Diagnosis not present

## 2020-10-27 DIAGNOSIS — Z0001 Encounter for general adult medical examination with abnormal findings: Secondary | ICD-10-CM

## 2020-10-27 DIAGNOSIS — R6889 Other general symptoms and signs: Secondary | ICD-10-CM | POA: Diagnosis not present

## 2020-10-27 DIAGNOSIS — Z0000 Encounter for general adult medical examination without abnormal findings: Secondary | ICD-10-CM

## 2020-10-27 DIAGNOSIS — R7309 Other abnormal glucose: Secondary | ICD-10-CM | POA: Diagnosis not present

## 2020-10-27 DIAGNOSIS — E663 Overweight: Secondary | ICD-10-CM | POA: Diagnosis not present

## 2020-10-28 LAB — CBC WITH DIFFERENTIAL/PLATELET
Absolute Monocytes: 882 cells/uL (ref 200–950)
Basophils Absolute: 81 cells/uL (ref 0–200)
Basophils Relative: 0.9 %
Eosinophils Absolute: 315 cells/uL (ref 15–500)
Eosinophils Relative: 3.5 %
HCT: 41 % (ref 35.0–45.0)
Hemoglobin: 13.6 g/dL (ref 11.7–15.5)
Lymphs Abs: 2520 cells/uL (ref 850–3900)
MCH: 28.2 pg (ref 27.0–33.0)
MCHC: 33.2 g/dL (ref 32.0–36.0)
MCV: 85.1 fL (ref 80.0–100.0)
MPV: 10.4 fL (ref 7.5–12.5)
Monocytes Relative: 9.8 %
Neutro Abs: 5202 cells/uL (ref 1500–7800)
Neutrophils Relative %: 57.8 %
Platelets: 278 10*3/uL (ref 140–400)
RBC: 4.82 10*6/uL (ref 3.80–5.10)
RDW: 13.1 % (ref 11.0–15.0)
Total Lymphocyte: 28 %
WBC: 9 10*3/uL (ref 3.8–10.8)

## 2020-10-28 LAB — COMPLETE METABOLIC PANEL WITH GFR
AG Ratio: 1.7 (calc) (ref 1.0–2.5)
ALT: 12 U/L (ref 6–29)
AST: 16 U/L (ref 10–35)
Albumin: 4.2 g/dL (ref 3.6–5.1)
Alkaline phosphatase (APISO): 76 U/L (ref 37–153)
BUN: 22 mg/dL (ref 7–25)
CO2: 28 mmol/L (ref 20–32)
Calcium: 9.9 mg/dL (ref 8.6–10.4)
Chloride: 107 mmol/L (ref 98–110)
Creat: 0.76 mg/dL (ref 0.60–0.93)
GFR, Est African American: 86 mL/min/{1.73_m2} (ref >=60)
GFR, Est Non African American: 75 mL/min/{1.73_m2} (ref >=60)
Globulin: 2.5 g/dL (calc) (ref 1.9–3.7)
Glucose, Bld: 97 mg/dL (ref 65–99)
Potassium: 4.4 mmol/L (ref 3.5–5.3)
Sodium: 144 mmol/L (ref 135–146)
Total Bilirubin: 0.2 mg/dL (ref 0.2–1.2)
Total Protein: 6.7 g/dL (ref 6.1–8.1)

## 2020-10-28 LAB — LIPID PANEL
Cholesterol: 163 mg/dL (ref <200)
HDL: 41 mg/dL — ABNORMAL LOW (ref >=50)
LDL Cholesterol (Calc): 95 mg/dL (calc)
Non-HDL Cholesterol (Calc): 122 mg/dL (calc) (ref <130)
Total CHOL/HDL Ratio: 4 (calc) (ref <5.0)
Triglycerides: 169 mg/dL — ABNORMAL HIGH (ref <150)

## 2020-10-28 LAB — HEMOGLOBIN A1C
Hgb A1c MFr Bld: 5.9 % of total Hgb — ABNORMAL HIGH (ref <5.7)
Mean Plasma Glucose: 123 mg/dL
eAG (mmol/L): 6.8 mmol/L

## 2020-10-28 LAB — TSH: TSH: 3.16 mIU/L (ref 0.40–4.50)

## 2020-10-28 LAB — MAGNESIUM: Magnesium: 1.9 mg/dL (ref 1.5–2.5)

## 2021-02-02 DIAGNOSIS — L565 Disseminated superficial actinic porokeratosis (DSAP): Secondary | ICD-10-CM | POA: Diagnosis not present

## 2021-02-02 DIAGNOSIS — L812 Freckles: Secondary | ICD-10-CM | POA: Diagnosis not present

## 2021-02-02 DIAGNOSIS — L820 Inflamed seborrheic keratosis: Secondary | ICD-10-CM | POA: Diagnosis not present

## 2021-02-02 DIAGNOSIS — L821 Other seborrheic keratosis: Secondary | ICD-10-CM | POA: Diagnosis not present

## 2021-02-02 DIAGNOSIS — D1801 Hemangioma of skin and subcutaneous tissue: Secondary | ICD-10-CM | POA: Diagnosis not present

## 2021-02-02 DIAGNOSIS — L308 Other specified dermatitis: Secondary | ICD-10-CM | POA: Diagnosis not present

## 2021-02-02 DIAGNOSIS — L570 Actinic keratosis: Secondary | ICD-10-CM | POA: Diagnosis not present

## 2021-02-02 DIAGNOSIS — Z85828 Personal history of other malignant neoplasm of skin: Secondary | ICD-10-CM | POA: Diagnosis not present

## 2021-02-02 DIAGNOSIS — L218 Other seborrheic dermatitis: Secondary | ICD-10-CM | POA: Diagnosis not present

## 2021-04-04 DIAGNOSIS — Z85828 Personal history of other malignant neoplasm of skin: Secondary | ICD-10-CM | POA: Diagnosis not present

## 2021-04-04 DIAGNOSIS — L308 Other specified dermatitis: Secondary | ICD-10-CM | POA: Diagnosis not present

## 2021-04-05 ENCOUNTER — Ambulatory Visit: Payer: PPO | Admitting: Nurse Practitioner

## 2021-05-05 DIAGNOSIS — H524 Presbyopia: Secondary | ICD-10-CM | POA: Diagnosis not present

## 2021-05-05 DIAGNOSIS — H35371 Puckering of macula, right eye: Secondary | ICD-10-CM | POA: Diagnosis not present

## 2021-05-05 DIAGNOSIS — H2513 Age-related nuclear cataract, bilateral: Secondary | ICD-10-CM | POA: Diagnosis not present

## 2021-05-18 ENCOUNTER — Ambulatory Visit
Admission: RE | Admit: 2021-05-18 | Discharge: 2021-05-18 | Disposition: A | Discharge status: Home / Self Care | Payer: PPO | Source: Ambulatory Visit | Attending: Adult Health | Admitting: Adult Health

## 2021-05-18 ENCOUNTER — Other Ambulatory Visit: Payer: Self-pay

## 2021-05-18 DIAGNOSIS — M85852 Other specified disorders of bone density and structure, left thigh: Secondary | ICD-10-CM

## 2021-05-18 DIAGNOSIS — M8589 Other specified disorders of bone density and structure, multiple sites: Secondary | ICD-10-CM | POA: Diagnosis not present

## 2021-05-22 DIAGNOSIS — L308 Other specified dermatitis: Secondary | ICD-10-CM | POA: Diagnosis not present

## 2021-05-22 DIAGNOSIS — Z85828 Personal history of other malignant neoplasm of skin: Secondary | ICD-10-CM | POA: Diagnosis not present

## 2021-05-22 DIAGNOSIS — L659 Nonscarring hair loss, unspecified: Secondary | ICD-10-CM | POA: Diagnosis not present

## 2021-07-26 NOTE — Progress Notes (Signed)
Comprehensive Evaluation &  Examination  Future Appointments  Date Time Provider Department  07/27/2021  2:00 PM Unk Pinto, MD [facility]  10/02/2021  3:00 PM Lavonna Monarch, MD [facility]  10/30/2021  3:30 PM Magda Bernheim, NP [facility]  08/01/2022  2:00 PM Unk Pinto, MD [facility]        This very nice 81 y.o. MWF presents for a  comprehensive evaluation and management of multiple medical co-morbidities.     2.   Patient has been followed for HTN, HLD, Prediabetes  and Vitamin D Deficiency.        Labile HTN predates since 2008 . Patient's BP has been controlled at home and patient denies any cardiac symptoms as chest pain, palpitations, shortness of breath, dizziness or ankle swelling. Today's BP is at goal  -  126/80.        Patient's hyperlipidemia is controlled with diet myalgias or other medication SE's. Last lipids were at goal except slightly elevated :  Lab Results  Component Value Date   CHOL 163 10/27/2020   HDL 41 (L) 10/27/2020   LDLCALC 95 10/27/2020   TRIG 169 (H) 10/27/2020   CHOLHDL 4.0 10/27/2020         Patient has hx/o prediabetes (A1c 6.2% /2011) and patient denies reactive hypoglycemic symptoms, visual blurring, diabetic polys or paresthesias. Last A1c was not at goal :  Lab Results  Component Value Date   HGBA1C 5.9 (H) 10/27/2020         Patient was dx'd Hypothyroid in 2014 &  was initiated on thyroid replacement.         Finally, patient has history of Vitamin D Deficiency  ("13" /2008)  and last Vitamin D was at goal :  Lab Results  Component Value Date   VD25OH 78 07/12/2020     Current Outpatient Medications on File Prior to Visit  Medication Sig   aspirin 81 MG  tablet Chew  daily.   VITAMIN D PO) Take 10,000 Units daily.   fluocinonide cream (LIDEX) 0.05 % as needed.    Krill Oil 1000 MG CAPS Take  daily.    levothyroxine 50 MCG tablet Take 1/2 to 1 tablet daily   OTC -[facility] allergy pill daily.   Zinc 50 MG CAPS Take  daily.    No Known Allergies   Past Medical History:  Diagnosis Date   Arthritis    Hyperlipidemia    Hypertension    Prediabetes    Thyroid disease    Vitamin D deficiency     Health Maintenance  Topic Date Due   Zoster Vaccines- Shingrix (1 of 2) Never done   COVID-19 Vaccine (4 - Booster for Moderna series) 12/19/2020   INFLUENZA VACCINE  02/13/2021   TETANUS/TDAP  05/02/2030   Pneumonia Vaccine 64+ Years old  Completed   DEXA AN  Completed   HPV VACCINES  Aged Out   Hepatitis C Screening  Discontinued     Immunization History  Administered Date(s) Administered   DT (Pediatric) 04/08/2014   Influenza, High Dose  06/20/2018, 04/21/2019, 05/02/2020   Influenza 04/21/2019   Moderna SARS-COV2 Booster Vacc 10/24/2020   Moderna Sars-Covid-2 Vacc 07/27/2019, 08/24/2019, 05/18/2020   Pneumococcal -13 05/15/2016   Pneumococcal -23 11/18/2006, 04/08/2014   Td 07/16/2002   Tdap 05/02/2020   Zoster, Live 01/29/2007    Last Colon - 01/13/2007 - Deatra Ina - recc 10 yr f/u   Cologard  08/03/2017   - Negative   Cologard  07/30/2020  -  Negative - recc 3 year f/u due Jan 2025  Last MGM - 08/31/2020   Past Surgical History:  Procedure Laterality Date   APPENDECTOMY     OTHER SURGICAL HISTORY  1981   BTL    TONSILLECTOMY       Family History  Problem Relation Age of Onset   Heart disease Mother    Asthma Mother    COPD Father    Heart disease Father    Gout Father    Cancer Brother    Breast cancer Neg Hx      Social History   Tobacco Use   Smoking status: Never   Smokeless tobacco: Never  Substance Use Topics   Alcohol use: Yes    Comment: rarely   Drug use: No      ROS Constitutional: Denies fever, chills, weight loss/gain, headaches, insomnia,  night sweats, and change in appetite. Does c/o fatigue. Eyes: Denies redness, blurred vision, diplopia, discharge, itchy, watery eyes.  ENT: Denies discharge, congestion, post nasal drip, epistaxis, sore  throat, earache, hearing loss, dental pain, Tinnitus, Vertigo, Sinus pain, snoring.  Cardio: Denies chest pain, palpitations, irregular heartbeat, syncope, dyspnea, diaphoresis, orthopnea, PND, claudication, edema Respiratory: denies cough, dyspnea, DOE, pleurisy, hoarseness, laryngitis, wheezing.  Gastrointestinal: Denies dysphagia, heartburn, reflux, water brash, pain, cramps, nausea, vomiting, bloating, diarrhea, constipation, hematemesis, melena, hematochezia, jaundice, hemorrhoids Genitourinary: Denies dysuria, frequency, urgency, nocturia, hesitancy, discharge, hematuria, flank pain Breast: Breast lumps, nipple discharge, bleeding.  Musculoskeletal: Denies arthralgia, myalgia, stiffness, Jt. Swelling, pain, limp, and strain/sprain. Denies falls. Skin: Denies puritis, rash, hives, warts, acne, eczema, changing in skin lesion Neuro: No weakness, tremor, incoordination, spasms, paresthesia, pain Psychiatric: Denies confusion, memory loss, sensory loss. Denies Depression. Endocrine: Denies change in weight, skin, hair change, nocturia, and paresthesia, diabetic polys, visual blurring, hyper / hypo glycemic episodes.  Heme/Lymph: No excessive bleeding, bruising, enlarged lymph nodes.  Physical Exam  BP 126/80    Pulse 82    Temp 97.9 F (36.6 C)    Resp 16    Ht 5\' 2"  (1.575 m)    Wt 145 lb 6.4 oz (66 kg)    SpO2 95%    BMI 26.59 kg/m   General Appearance: Well nourished, well groomed and in no apparent distress.  Eyes: PERRLA, EOMs, conjunctiva no swelling or erythema, normal fundi and vessels. Sinuses: No frontal/maxillary tenderness ENT/Mouth: EACs patent / TMs  nl. Nares clear without erythema, swelling, mucoid exudates. Oral hygiene is good. No erythema, swelling, or exudate. Tongue normal, non-obstructing. Tonsils not swollen or erythematous. Hearing normal.  Neck: Supple, thyroid not palpable. No bruits, nodes or JVD. Respiratory: Respiratory effort normal.  BS equal and clear  bilateral without rales, rhonci, wheezing or stridor. Cardio: Heart sounds are normal with regular rate and rhythm and no murmurs, rubs or gallops. Peripheral pulses are normal and equal bilaterally without edema. No aortic or femoral bruits. Chest: symmetric with normal excursions and percussion. Breasts: Symmetric, without lumps, nipple discharge, retractions, or fibrocystic changes.  Abdomen: Flat, soft with bowel sounds active. Nontender, no guarding, rebound, hernias, masses, or organomegaly.  Lymphatics: Non tender without lymphadenopathy.  Genitourinary:  Musculoskeletal: Full ROM all peripheral extremities, joint stability, 5/5 strength, and normal gait. Skin: Warm and dry without rashes, lesions, cyanosis, clubbing or  ecchymosis.  Neuro: Cranial nerves intact, reflexes equal bilaterally. Normal muscle tone, no cerebellar symptoms. Sensation intact.  Pysch: Alert and oriented X 3, normal affect, Insight and Judgment appropriate.    Assessment and Plan  1. Annual Preventative Screening Examination   2. Essential hypertension  - EKG 12-Lead - Urinalysis, Routine w reflex microscopic - Microalbumin / creatinine urine ratio - CBC with Differential/Platelet - COMPLETE METABOLIC PANEL WITH GFR - Magnesium - TSH  3. Hyperlipidemia, mixed  - EKG 12-Lead - Lipid panel - TSH  4. Abnormal glucose  - EKG 12-Lead - HM DIABETES FOOT EXAM - PR LOW EXTEMITY NEUR EXAM DOCUM - Insulin, random - Hemoglobin A1c  5. Vitamin D deficiency  - VITAMIN D 25 Hydroxy   6. Prediabetes  - Insulin, random - Hemoglobin A1c  7. Hypothyroidism, unspecified type  - TSH  8. Screening for colorectal cancer  - POC Hemoccult Bld/Stl 9. Screening for ischemic heart disease  - EKG 12-Lead  10. FHx: heart disease  - EKG 12-Lead  11. Medication management  - Urinalysis, Routine w reflex microscopic - Microalbumin / creatinine urine ratio - CBC with Differential/Platelet -  COMPLETE METABOLIC PANEL WITH GFR - Magnesium - Lipid panel - TSH - Insulin, random - VITAMIN D 25 Hydroxy - Hemoglobin A1c           Patient was counseled in prudent diet to achieve/maintain BMI less than 25 for weight control, BP monitoring, regular exercise and medications. Discussed med's effects and SE's. Screening labs and tests as requested with regular follow-up as recommended. Over 40 minutes of exam, counseling, chart review and high complex critical decision making was performed.   Kirtland Bouchard, MD

## 2021-07-27 ENCOUNTER — Encounter: Payer: Self-pay | Admitting: Internal Medicine

## 2021-07-27 ENCOUNTER — Other Ambulatory Visit: Payer: Self-pay

## 2021-07-27 ENCOUNTER — Ambulatory Visit (INDEPENDENT_AMBULATORY_CARE_PROVIDER_SITE_OTHER): Payer: PPO | Admitting: Internal Medicine

## 2021-07-27 VITALS — BP 126/80 | HR 82 | Temp 97.9°F | Resp 16 | Ht 62.0 in | Wt 145.4 lb

## 2021-07-27 DIAGNOSIS — Z1211 Encounter for screening for malignant neoplasm of colon: Secondary | ICD-10-CM

## 2021-07-27 DIAGNOSIS — R7309 Other abnormal glucose: Secondary | ICD-10-CM

## 2021-07-27 DIAGNOSIS — E559 Vitamin D deficiency, unspecified: Secondary | ICD-10-CM

## 2021-07-27 DIAGNOSIS — I10 Essential (primary) hypertension: Secondary | ICD-10-CM

## 2021-07-27 DIAGNOSIS — Z79899 Other long term (current) drug therapy: Secondary | ICD-10-CM

## 2021-07-27 DIAGNOSIS — R7303 Prediabetes: Secondary | ICD-10-CM | POA: Diagnosis not present

## 2021-07-27 DIAGNOSIS — Z136 Encounter for screening for cardiovascular disorders: Secondary | ICD-10-CM

## 2021-07-27 DIAGNOSIS — Z8249 Family history of ischemic heart disease and other diseases of the circulatory system: Secondary | ICD-10-CM

## 2021-07-27 DIAGNOSIS — E039 Hypothyroidism, unspecified: Secondary | ICD-10-CM

## 2021-07-27 DIAGNOSIS — E782 Mixed hyperlipidemia: Secondary | ICD-10-CM | POA: Diagnosis not present

## 2021-07-27 DIAGNOSIS — Z0001 Encounter for general adult medical examination with abnormal findings: Secondary | ICD-10-CM

## 2021-07-27 NOTE — Patient Instructions (Signed)
Due to recent changes in healthcare laws, you may see the results of your imaging and laboratory studies on MyChart before your provider has had a chance to review them.  We understand that in some cases there may be results that are confusing or concerning to you. Not all laboratory results come back in the same time frame and the provider may be waiting for multiple results in order to interpret others.  Please give [place] 48 hours in order for your provider to thoroughly review all the results before contacting the office for clarification of your results.   +++++++++++++++++++++++++  Vit D  & Vit C 1,000 mg   are recommended to help protect  against the Covid-19 and other Corona viruses.    Also it's recommended  to take  Zinc 50 mg  to help  protect against the Covid-19   and best place to get  is also on [email or web address]  and don't pay more than 6-8 cents /pill !  ================================ Coronavirus (COVID-19) Are you at risk?  Are you at risk for the Coronavirus (COVID-19)?  To be considered HIGH RISK for Coronavirus (COVID-19), you have to meet the following criteria:  Traveled to [place], [place], [place], [place] or [place]; or in the [place] to [place], [place], [place]  or [place]; and have fever, cough, and shortness of breath within the last 2 weeks of travel OR Been in close contact with a person diagnosed with COVID-19 within the last 2 weeks and have  fever, cough,and shortness of breath  IF YOU DO NOT MEET THESE CRITERIA, YOU ARE CONSIDERED LOW RISK FOR COVID-19.  What to do if you are HIGH RISK for COVID-19?  If you are having a medical emergency, call 911. Seek medical care right away. Before you go to a doctors office, urgent care or emergency department,  call ahead and tell them about your recent travel, contact with someone diagnosed with COVID-19   and your symptoms.  You should receive instructions from your physicians office regarding next  steps of care.  When you arrive at healthcare provider, tell the healthcare staff immediately you have returned from  visiting [place], [place], [place], [place] or [place]; or traveled in the [place] to [place], [place],  [place] or [place] in the last two weeks or you have been in close contact with a person diagnosed with  COVID-19 in the last 2 weeks.   Tell the health care staff about your symptoms: fever, cough and shortness of breath. After you have been seen by a medical provider, you will be either: Tested for (COVID-19) and discharged home on quarantine except to seek medical care if  symptoms worsen, and asked to  Stay home and avoid contact with others until you get your results (4-5 days)  Avoid travel on public transportation if possible (such as bus, train, or airplane) or Sent to the Emergency Department by EMS for evaluation, COVID-19 testing  and  possible admission depending on your condition and test results.  What to do if you are LOW RISK for COVID-19?  Reduce your risk of any infection by using the same precautions used for avoiding the common cold or flu:  Wash your hands often with soap and warm water for at least 20 seconds.  If soap and water are not readily available,  use an alcohol-based hand sanitizer with at least 60% alcohol.  If coughing or sneezing, cover your mouth and nose by coughing  or sneezing into the elbow areas of your shirt or coat,  into a tissue or into your sleeve (not your hands). Avoid shaking hands with others and consider head nods or verbal greetings only. Avoid touching your eyes, nose, or mouth with unwashed hands.  Avoid close contact with people who are sick. Avoid places or events with large numbers of people in one location, like concerts or sporting events. Carefully consider travel plans you have or are making. If you are planning any travel outside or inside the [place], visit the [place] webpage for the  latest health notices. If you have some symptoms but not all symptoms, continue to monitor at home and seek medical attention  if your symptoms worsen. If you are having a medical emergency, call 911.   >>>>>>>>>>>>>>>>>>>>>>>>>>>>>>>>>  We Do NOT Approve of [employer] > > > > > > > > > > > > > > > > > > > > > > > > > > > > > > > > > > > > > > >  Preventive Care for Adults  A healthy lifestyle and preventive care can promote health and wellness. Preventive health guidelines for women include the following key practices. A routine yearly physical is a good way to check with your health care provider about your health and preventive screening. It is a chance to share any concerns and updates on your health and to receive a thorough exam. Visit your dentist for a routine exam and preventive care every 6 months. Brush your teeth twice a day and floss once a day. Good oral hygiene prevents tooth decay and gum disease. The frequency of eye exams is based on your age, health, family medical history, use of contact lenses, and other factors. Follow your health care provider's recommendations for frequency of eye exams. Eat a healthy diet. Foods like vegetables, fruits, whole grains, low-fat dairy products, and lean protein foods contain the nutrients you need without too many calories. Decrease your intake of foods high in solid fats, added sugars, and salt. Eat the right amount of calories for you. Get information about a proper diet from your health care provider, if necessary. Regular physical exercise is one of the most important things you can do for your health. Most adults should get at least 150 minutes of moderate-intensity exercise (any activity that increases your heart rate and causes you to sweat) each week. In addition, most adults need muscle-strengthening exercises on 2 or more days a week. Maintain a healthy weight. The body mass index (BMI) is a screening tool to identify  possible weight problems. It provides an estimate of body fat based on height and weight. Your health care provider can find your BMI and can help you achieve or maintain a healthy weight. For adults 20 years and older: A BMI below 18.5 is considered underweight. A BMI of 18.5 to 24.9 is normal. A BMI of 25 to 29.9 is considered overweight. A BMI of 30 and above is considered obese. Maintain normal blood lipids and cholesterol levels by exercising and minimizing your intake of saturated fat. Eat a balanced diet with plenty of fruit and vegetables. If your lipid or cholesterol levels are high, you are over 50, or you are at high risk for heart disease, you may need your cholesterol levels checked more frequently. Ongoing high lipid and cholesterol levels should be treated with medicines if diet and exercise are not working. If you smoke, find out from  your health care provider how to quit. If you do not use tobacco, do not start. °Lung cancer screening is recommended for adults aged 55-80 years who are at high risk for developing lung cancer because of a history of smoking. A yearly low-dose CT scan of the lungs is recommended for people who have at least a 30-pack-year history of smoking and are a current smoker or have quit within the past 15 years. A pack year of smoking is smoking an average of 1 pack of cigarettes a day for 1 year (for example: 1 pack a day for 30 years or 2 packs a day for 15 years). Yearly screening should continue until the smoker has stopped smoking for at least 15 years. Yearly screening should be stopped for people who develop a health problem that would prevent them from having lung cancer treatment. °Avoid use of street drugs. Do not share needles with anyone. Ask for help if you need support or instructions about stopping the use of drugs. °High blood pressure causes heart disease and increases the risk of stroke.  Ongoing high blood pressure should be treated with medicines if  weight loss and exercise do not work. °If you are 55-79 years old, ask your health care provider if you should take aspirin to prevent strokes. °Diabetes screening involves taking a blood sample to check your fasting blood sugar level. This should be done once every 3 years, after age 45, if you are within normal weight and without risk factors for diabetes. Testing should be considered at a younger age or be carried out more frequently if you are overweight and have at least 1 risk factor for diabetes. °Breast cancer screening is essential preventive care for women. You should practice "breast self-awareness." This means understanding the normal appearance and feel of your breasts and may include breast self-examination. Any changes detected, no matter how small, should be reported to a health care provider. Women in their 20s and 30s should have a clinical breast exam (CBE) by a health care provider as part of a regular health exam every 1 to 3 years. After age 40, women should have a CBE every year. Starting at age 40, women should consider having a mammogram (breast X-ray test) every year. Women who have a family history of breast cancer should talk to their health care provider about genetic screening. Women at a high risk of breast cancer should talk to their health care providers about having an MRI and a mammogram every year. °Breast cancer gene (BRCA)-related cancer risk assessment is recommended for women who have family members with BRCA-related cancers. BRCA-related cancers include breast, ovarian, tubal, and peritoneal cancers. Having family members with these cancers may be associated with an increased risk for harmful changes (mutations) in the breast cancer genes BRCA1 and BRCA2. Results of the assessment will determine the need for genetic counseling and BRCA1 and BRCA2 testing. °Routine pelvic exams to screen for cancer are no longer recommended for nonpregnant women who are considered low risk for  cancer of the pelvic organs (ovaries, uterus, and vagina) and who do not have symptoms. Ask your health care provider if a screening pelvic exam is right for you. °If you have had past treatment for cervical cancer or a condition that could lead to cancer, you need Pap tests and screening for cancer for at least 20 years after your treatment. If Pap tests have been discontinued, your risk factors (such as having a new sexual partner) need to be   reassessed to determine if screening should be resumed. Some women have medical problems that increase the chance of getting cervical cancer. In these cases, your health care provider may recommend more frequent screening and Pap tests.  Colorectal cancer can be detected and often prevented. Most routine colorectal cancer screening begins at the age of 67 years and continues through age 62 years. However, your health care provider may recommend screening at an earlier age if you have risk factors for colon cancer. On a yearly basis, your health care provider may provide home test kits to check for hidden blood in the stool. Use of a small camera at the end of a tube, to directly examine the colon (sigmoidoscopy or colonoscopy), can detect the earliest forms of colorectal cancer. Talk to your health care provider about this at age 90, when routine screening begins.  Direct exam of the colon should be repeated every 5-10 years through age 30 years, unless early forms of pre-cancerous polyps or small growths are found. Osteoporosis is a disease in which the bones lose minerals and strength with aging. This can result in serious bone fractures or breaks. The risk of osteoporosis can be identified using a bone density scan. Women ages 43 years and over and women at risk for fractures or osteoporosis should discuss screening with their health care providers. Ask your health care provider whether you should take a calcium supplement or vitamin D to reduce the rate of  osteoporosis. Menopause can be associated with physical symptoms and risks. Hormone replacement therapy is available to decrease symptoms and risks. You should talk to your health care provider about whether hormone replacement therapy is right for you. Use sunscreen. Apply sunscreen liberally and repeatedly throughout the day. You should seek shade when your shadow is shorter than you. Protect yourself by wearing long sleeves, pants, a wide-brimmed hat, and sunglasses year round, whenever you are outdoors. Once a month, do a whole body skin exam, using a mirror to look at the skin on your back. Tell your health care provider of new moles, moles that have irregular borders, moles that are larger than a pencil eraser, or moles that have changed in shape or color. Stay current with required vaccines (immunizations). Influenza vaccine. All adults should be immunized every year. Tetanus, diphtheria, and acellular pertussis (Td, Tdap) vaccine. Pregnant women should receive 1 dose of Tdap vaccine during each pregnancy. The dose should be obtained regardless of the length of time since the last dose. Immunization is preferred during the 27th-36th week of gestation. An adult who has not previously received Tdap or who does not know her vaccine status should receive 1 dose of Tdap. This initial dose should be followed by tetanus and diphtheria toxoids (Td) booster doses every 10 years. Adults with an unknown or incomplete history of completing a 3-dose immunization series with Td-containing vaccines should begin or complete a primary immunization series including a Tdap dose. Adults should receive a Td booster every 10 years.  Zoster vaccine. One dose is recommended for adults aged 56 years or older unless certain conditions are present.  Pneumococcal 13-valent conjugate (PCV13) vaccine. When indicated, a person who is uncertain of her immunization history and has no record of immunization should receive the PCV13  vaccine. An adult aged 58 years or older who has certain medical conditions and has not been previously immunized should receive 1 dose of PCV13 vaccine. This PCV13 should be followed with a dose of pneumococcal polysaccharide (PPSV23) vaccine. The PPSV23  vaccine dose should be obtained at least 1 or more year(s) after the dose of PCV13 vaccine. An adult aged 81 years or older who has certain medical conditions and previously received 1 or more doses of PPSV23 vaccine should receive 1 dose of PCV13. The PCV13 vaccine dose should be obtained 1 or more years after the last PPSV23 vaccine dose.  Pneumococcal polysaccharide (PPSV23) vaccine. When PCV13 is also indicated, PCV13 should be obtained first. All adults aged 52 years and older should be immunized. An adult younger than age 47 years who has certain medical conditions should be immunized. Any person who resides in a nursing home or long-term care facility should be immunized. An adult smoker should be immunized. People with an immunocompromised condition and certain other conditions should receive both PCV13 and PPSV23 vaccines. People with human immunodeficiency virus (HIV) infection should be immunized as soon as possible after diagnosis. Immunization during chemotherapy or radiation therapy should be avoided. Routine use of PPSV23 vaccine is not recommended for American Indians, [place] Natives, or people younger than 65 years unless there are medical conditions that require PPSV23 vaccine. When indicated, people who have unknown immunization and have no record of immunization should receive PPSV23 vaccine. One-time revaccination 5 years after the first dose of PPSV23 is recommended for people aged 19-64 years who have chronic kidney failure, nephrotic syndrome, asplenia, or immunocompromised conditions. People who received 1-2 doses of PPSV23 before age 69 years should receive another dose of PPSV23 vaccine at age 51 years or later if at least 5 years have  passed since the previous dose. Doses of PPSV23 are not needed for people immunized with PPSV23 at or after age 6 years.  Preventive Services / Frequency  Ages 110 years and over Blood pressure check. Lipid and cholesterol check. Lung cancer screening. / Every year if you are aged 89-80 years and have a 30-pack-year history of smoking and currently smoke or have quit within the past 15 years. Yearly screening is stopped once you have quit smoking for at least 15 years or develop a health problem that would prevent you from having lung cancer treatment. Clinical breast exam.** / Every year after age 44 years.  BRCA-related cancer risk assessment.** / For women who have family members with a BRCA-related cancer (breast, ovarian, tubal, or peritoneal cancers). Mammogram.** / Every year beginning at age 68 years and continuing for as long as you are in good health. Consult with your health care provider. Pap test.** / Every 3 years starting at age 77 years through age 70 or 81 years with 3 consecutive normal Pap tests. Testing can be stopped between 65 and 70 years with 3 consecutive normal Pap tests and no abnormal Pap or HPV tests in the past 10 years. Fecal occult blood test (FOBT) of stool. / Every year beginning at age 49 years and continuing until age 70 years. You may not need to do this test if you get a colonoscopy every 10 years. Flexible sigmoidoscopy or colonoscopy.** / Every 5 years for a flexible sigmoidoscopy or every 10 years for a colonoscopy beginning at age 59 years and continuing until age 73 years. Hepatitis C blood test.** / For all people born from [birth year] through 1964 and any individual with known risks for hepatitis C. Osteoporosis screening.** / A one-time screening for women ages 19 years and over and women at risk for fractures or osteoporosis. Skin self-exam. / Monthly. Influenza vaccine. / Every year. Tetanus, diphtheria, and acellular pertussis (Tdap/Td) vaccine.** /  1 dose  of Td every 10 years. °Zoster vaccine.** / 1 dose for adults aged 60 years or older. °Pneumococcal 13-valent conjugate (PCV13) vaccine.** / Consult your health care provider. °Pneumococcal polysaccharide (PPSV23) vaccine.** / 1 dose for all adults aged 65 years and older. °Screening for abdominal aortic aneurysm (AAA)  by ultrasound is recommended for people who have history of high blood pressure or who are current or former smokers. °++++++++++++++++++++ °Recommend Adult Low Dose Aspirin or  °coated  Aspirin 81 mg daily  °To reduce risk of Colon Cancer 40 %, ° Skin Cancer 26 % ,  °Melanoma 46% ° and  °Pancreatic cancer 60% °++++++++++++++++++++ °Vitamin D goal ° is between 70-100.  °Please make sure that you are taking your Vitamin D as directed.  °It is very important as a natural anti-inflammatory  °helping hair, skin, and nails, as well as reducing stroke and heart attack risk.  °It helps your bones and helps with mood. °It also decreases numerous cancer risks so please take it as directed.  °Low Vit D is associated with a 200-300% higher risk for CANCER  °and 200-300% higher risk for HEART   ATTACK  &  STROKE.   °...................................... °It is also associated with higher death rate at younger ages,  °autoimmune diseases like Rheumatoid arthritis, Lupus, Multiple Sclerosis.    °Also many other serious conditions, like depression, Alzheimer's °Dementia, infertility, muscle aches, fatigue, fibromyalgia - just to name a few. °++++++++++++++++++ °Recommend the book "The END of DIETING" by Dr Joel Fuhrman  °& the book "The END of DIABETES " by Dr Joel Fuhrman °At [employer] - get book & Audio CD's  °  Being diabetic has a  300% increased risk for heart attack, stroke, cancer, and alzheimer- type vascular dementia. It is very important that you work harder with diet by avoiding all foods that are white. Avoid white rice (brown & wild rice is OK), white potatoes (sweetpotatoes in moderation is OK),  White bread or wheat bread or anything made out of white flour like bagels, donuts, rolls, buns, biscuits, cakes, pastries, cookies, pizza crust, and pasta (made from white flour & egg whites) - vegetarian pasta or spinach or wheat pasta is OK. Multigrain breads like Arnold's or [employer], or multigrain sandwich thins or flatbreads.  Diet, exercise and weight loss can reverse and cure diabetes in the early stages.  Diet, exercise and weight loss is very important in the control and prevention of complications of diabetes which affects every system in your body, ie. Brain - dementia/stroke, eyes - glaucoma/blindness, heart - heart attack/heart failure, kidneys - dialysis, stomach - gastric paralysis, intestines - malabsorption, nerves - severe painful neuritis, circulation - gangrene & loss of a leg(s), and finally cancer and Alzheimers. ° °  I recommend avoid fried & greasy foods,  sweets/candy, white rice (brown or wild rice or Quinoa is OK), white potatoes (sweet potatoes are OK) - anything made from white flour - bagels, doughnuts, rolls, buns, biscuits,white and wheat breads, pizza crust and traditional pasta made of white flour & egg white(vegetarian pasta or spinach or wheat pasta is OK).  Multi-grain bread is OK - like multi-grain flat bread or sandwich thins. Avoid alcohol in excess. Exercise is also important. ° °  Eat all the vegetables you want - avoid meat, especially red meat and dairy - especially cheese.  Cheese is the most concentrated form of trans-fats which is the worst thing to clog up our arteries. Veggie cheese is OK   which can be found in the fresh produce section at [facility] or [employer] or [employer]  +++++++++++++++++++ DASH Eating Plan  DASH stands for "Dietary Approaches to Stop Hypertension."   The DASH eating plan is a healthy eating plan that has been shown to reduce high blood pressure (hypertension). Additional health benefits may include reducing the risk of type 2  diabetes mellitus, heart disease, and stroke. The DASH eating plan may also help with weight loss. WHAT DO I NEED TO KNOW ABOUT THE DASH EATING PLAN? For the DASH eating plan, you will follow these general guidelines: Choose foods with a percent daily value for sodium of less than 5% (as listed on the food label). Use salt-free seasonings or herbs instead of table salt or sea salt. Check with your health care provider or pharmacist before using salt substitutes. Eat lower-sodium products, often labeled as "lower sodium" or "no salt added." Eat fresh foods. Eat more vegetables, fruits, and low-fat dairy products. Choose whole grains. Look for the word "whole" as the first word in the ingredient list. Choose fish  Limit sweets, desserts, sugars, and sugary drinks. Choose heart-healthy fats. Eat veggie cheese  Eat more home-cooked food and less restaurant, buffet, and fast food. Limit fried foods. Cook foods using methods other than frying. Limit canned vegetables. If you do use them, rinse them well to decrease the sodium. When eating at a restaurant, ask that your food be prepared with less salt, or no salt if possible.                      WHAT FOODS CAN I EAT? Read Dr Fara Olden Fuhrman's books on The End of Dieting & The End of Diabetes  Grains Whole grain or whole wheat bread. Brown rice. Whole grain or whole wheat pasta. Quinoa, bulgur, and whole grain cereals. Low-sodium cereals. Corn or whole wheat flour tortillas. Whole grain cornbread. Whole grain crackers. Low-sodium crackers.  Vegetables Fresh or frozen vegetables (raw, steamed, roasted, or grilled). Low-sodium or reduced-sodium tomato and vegetable juices. Low-sodium or reduced-sodium tomato sauce and paste. Low-sodium or reduced-sodium canned vegetables.   Fruits All fresh, canned (in natural juice), or frozen fruits.  Protein Products  All fish and seafood.  Dried beans, peas, or lentils. Unsalted nuts and seeds. Unsalted  canned beans.  Dairy Low-fat dairy products, such as skim or 1% milk, 2% or reduced-fat cheeses, low-fat ricotta or cottage cheese, or plain low-fat yogurt. Low-sodium or reduced-sodium cheeses.  Fats and Oils Tub margarines without trans fats. Light or reduced-fat mayonnaise and salad dressings (reduced sodium). Avocado. Safflower, olive, or canola oils. Natural peanut or almond butter.  Other Unsalted popcorn and pretzels. The items listed above may not be a complete list of recommended foods or beverages. Contact your dietitian for more options.  +++++++++++++++  WHAT FOODS ARE NOT RECOMMENDED? Grains/ White flour or wheat flour White bread. White pasta. White rice. Refined cornbread. Bagels and croissants. Crackers that contain trans fat.  Vegetables  Creamed or fried vegetables. Vegetables in a . Regular canned vegetables. Regular canned tomato sauce and paste. Regular tomato and vegetable juices.  Fruits Dried fruits. Canned fruit in light or heavy syrup. Fruit juice.  Meat and Other Protein Products Meat in general - RED meat & White meat.  Fatty cuts of meat. Ribs, chicken wings, all processed meats as bacon, sausage, bologna, salami, fatback, hot dogs, bratwurst and packaged luncheon meats.  Dairy Whole or 2% milk, cream, half-and-half, and cream cheese.  Whole-fat or sweetened yogurt. Full-fat cheeses or blue cheese. Non-dairy creamers and whipped toppings. Processed cheese, cheese spreads, or cheese curds. ° °Condiments °Onion and garlic salt, seasoned salt, table salt, and sea salt. Canned and packaged gravies. Worcestershire sauce. Tartar sauce. Barbecue sauce. Teriyaki sauce. Soy sauce, including reduced sodium. Steak sauce. Fish sauce. Oyster sauce. Cocktail sauce. Horseradish. Ketchup and mustard. Meat flavorings and tenderizers. Bouillon cubes. Hot sauce. Tabasco sauce. Marinades. Taco seasonings. Relishes. ° °Fats and Oils °Butter, stick margarine, lard, shortening and  bacon fat. Coconut, palm kernel, or palm oils. Regular salad dressings. ° °Pickles and olives. Salted popcorn and pretzels. ° °The items listed above may not be a complete list of foods and beverages to avoid. ° ° °

## 2021-07-28 LAB — INSULIN, RANDOM: Insulin: 7.4 u[IU]/mL

## 2021-07-28 LAB — COMPLETE METABOLIC PANEL WITH GFR
AG Ratio: 1.7 (calc) (ref 1.0–2.5)
ALT: 19 U/L (ref 6–29)
AST: 17 U/L (ref 10–35)
Albumin: 4.4 g/dL (ref 3.6–5.1)
Alkaline phosphatase (APISO): 78 U/L (ref 37–153)
BUN: 18 mg/dL (ref 7–25)
CO2: 28 mmol/L (ref 20–32)
Calcium: 9.8 mg/dL (ref 8.6–10.4)
Chloride: 106 mmol/L (ref 98–110)
Creat: 0.67 mg/dL (ref 0.60–1.00)
Globulin: 2.6 g/dL (calc) (ref 1.9–3.7)
Glucose, Bld: 90 mg/dL (ref 65–99)
Potassium: 3.9 mmol/L (ref 3.5–5.3)
Sodium: 143 mmol/L (ref 135–146)
Total Bilirubin: 0.4 mg/dL (ref 0.2–1.2)
Total Protein: 7 g/dL (ref 6.1–8.1)
eGFR: 89 mL/min/{1.73_m2} (ref >=60)

## 2021-07-28 LAB — HEMOGLOBIN A1C
Hgb A1c MFr Bld: 5.9 % of total Hgb — ABNORMAL HIGH (ref <5.7)
Mean Plasma Glucose: 123 mg/dL
eAG (mmol/L): 6.8 mmol/L

## 2021-07-28 LAB — MICROALBUMIN / CREATININE URINE RATIO
Creatinine, Urine: 105 mg/dL (ref 20–275)
Microalb Creat Ratio: 8 mcg/mg creat (ref <30)
Microalb, Ur: 0.8 mg/dL

## 2021-07-28 LAB — CBC WITH DIFFERENTIAL/PLATELET
Absolute Monocytes: 690 cells/uL (ref 200–950)
Basophils Absolute: 46 cells/uL (ref 0–200)
Basophils Relative: 0.5 %
Eosinophils Absolute: 166 cells/uL (ref 15–500)
Eosinophils Relative: 1.8 %
HCT: 42.1 % (ref 35.0–45.0)
Hemoglobin: 13.9 g/dL (ref 11.7–15.5)
Lymphs Abs: 2162 cells/uL (ref 850–3900)
MCH: 27.6 pg (ref 27.0–33.0)
MCHC: 33 g/dL (ref 32.0–36.0)
MCV: 83.5 fL (ref 80.0–100.0)
MPV: 9.9 fL (ref 7.5–12.5)
Monocytes Relative: 7.5 %
Neutro Abs: 6136 cells/uL (ref 1500–7800)
Neutrophils Relative %: 66.7 %
Platelets: 301 10*3/uL (ref 140–400)
RBC: 5.04 10*6/uL (ref 3.80–5.10)
RDW: 13.2 % (ref 11.0–15.0)
Total Lymphocyte: 23.5 %
WBC: 9.2 10*3/uL (ref 3.8–10.8)

## 2021-07-28 LAB — MAGNESIUM: Magnesium: 2 mg/dL (ref 1.5–2.5)

## 2021-07-28 LAB — URINALYSIS, ROUTINE W REFLEX MICROSCOPIC
Bilirubin Urine: NEGATIVE
Glucose, UA: NEGATIVE
Hgb urine dipstick: NEGATIVE
Ketones, ur: NEGATIVE
Leukocytes,Ua: NEGATIVE
Nitrite: NEGATIVE
Protein, ur: NEGATIVE
Specific Gravity, Urine: 1.019 (ref 1.001–1.035)
pH: 6.5 (ref 5.0–8.0)

## 2021-07-28 LAB — LIPID PANEL
Cholesterol: 164 mg/dL (ref <200)
HDL: 49 mg/dL — ABNORMAL LOW (ref >=50)
LDL Cholesterol (Calc): 92 mg/dL (calc)
Non-HDL Cholesterol (Calc): 115 mg/dL (calc) (ref <130)
Total CHOL/HDL Ratio: 3.3 (calc) (ref <5.0)
Triglycerides: 134 mg/dL (ref <150)

## 2021-07-28 LAB — VITAMIN D 25 HYDROXY (VIT D DEFICIENCY, FRACTURES): Vit D, 25-Hydroxy: 91 ng/mL (ref 30–100)

## 2021-07-28 LAB — TSH: TSH: 3.51 mIU/L (ref 0.40–4.50)

## 2021-07-29 NOTE — Progress Notes (Signed)
============================================================ °-   Test results slightly outside the reference range are not unusual. If there is anything important, I will review this with you,  otherwise it is considered normal test values.  If you have further questions,  please do not hesitate to contact me at the office or via My Chart.  ============================================================ ============================================================  -  Total Chol = 164   &   LDL Chol = 92   - Both  Excellent   - Very low risk for Heart Attack  / Stroke ============================================================ ============================================================  -  A1c = 5.9% - Blood sugar and A1c are  STILL  elevated in the borderline and  early or pre-diabetes range which has the same   300% increased risk for heart attack, stroke, cancer and alzheimer- type                                                           vascular dementia as full blown diabetes.   - But the good news is that diet, exercise with  weight loss can cure the early diabetes at this point.  - It is very important that you work harder with diet by  avoiding all foods that are white except chicken,   fish & calliflower.  - Avoid white rice  (brown & wild rice is OK),   - Avoid white potatoes  (sweet potatoes in moderation is OK),   White bread or wheat bread or anything made out of   white flour like bagels, donuts, rolls, buns, biscuits, cakes,  - pastries, cookies, pizza crust, and pasta (made from  white flour & egg whites)   - vegetarian pasta or spinach or wheat pasta is OK.  - Multigrain breads like Arnold's, Pepperidge Farm or   multigrain sandwich thins or high fiber breads like   [place] bread or "Dave's Killer" breads that are  4 to 5 grams fiber per slice !  are best.    Diet, exercise and weight loss can reverse and cure  diabetes in the early stages.    ============================================================ ============================================================  - Vitamin D = 91  - Excellent  - Please continue dose same  ============================================================ ============================================================  - All Else - CBC - Kidneys - Electrolytes - Liver - Magnesium & Thyroid    - all  Normal / OK ============================================================ ============================================================  - Keep up the [place] Work  !  ============================================================ ============================================================

## 2021-07-30 ENCOUNTER — Encounter: Payer: Self-pay | Admitting: Internal Medicine

## 2021-07-30 ENCOUNTER — Other Ambulatory Visit: Payer: Self-pay | Admitting: Internal Medicine

## 2021-07-30 MED ORDER — TERBINAFINE HCL 250 MG PO TABS: ORAL_TABLET | ORAL | 1 refills | Status: DC

## 2021-08-14 ENCOUNTER — Other Ambulatory Visit: Payer: Self-pay

## 2021-08-14 DIAGNOSIS — Z1211 Encounter for screening for malignant neoplasm of colon: Secondary | ICD-10-CM | POA: Diagnosis not present

## 2021-08-14 DIAGNOSIS — Z1212 Encounter for screening for malignant neoplasm of rectum: Secondary | ICD-10-CM | POA: Diagnosis not present

## 2021-08-14 LAB — POC HEMOCCULT BLD/STL (HOME/3-CARD/SCREEN)
Card #2 Fecal Occult Blod, POC: NEGATIVE
Card #3 Fecal Occult Blood, POC: NEGATIVE
Fecal Occult Blood, POC: NEGATIVE

## 2021-08-21 ENCOUNTER — Other Ambulatory Visit: Payer: Self-pay | Admitting: Internal Medicine

## 2021-10-02 ENCOUNTER — Encounter: Payer: Self-pay | Admitting: Dermatology

## 2021-10-02 ENCOUNTER — Ambulatory Visit: Payer: PPO | Admitting: Dermatology

## 2021-10-02 ENCOUNTER — Other Ambulatory Visit: Payer: Self-pay

## 2021-10-02 DIAGNOSIS — Z1283 Encounter for screening for malignant neoplasm of skin: Secondary | ICD-10-CM | POA: Diagnosis not present

## 2021-10-02 DIAGNOSIS — L570 Actinic keratosis: Secondary | ICD-10-CM | POA: Diagnosis not present

## 2021-10-02 DIAGNOSIS — L2389 Allergic contact dermatitis due to other agents: Secondary | ICD-10-CM

## 2021-10-02 DIAGNOSIS — L821 Other seborrheic keratosis: Secondary | ICD-10-CM | POA: Diagnosis not present

## 2021-10-02 NOTE — Patient Instructions (Signed)
[place]  ?

## 2021-10-15 ENCOUNTER — Encounter: Payer: Self-pay | Admitting: Dermatology

## 2021-10-15 NOTE — Progress Notes (Signed)
? ?  New Patient ?  ?Subjective  ?Anna Caldwell is a 81 y.o. female who presents for the following: Skin Problem (Pt states her hand were swollen and could not bend them in October. Pt here for to figure out what was wrong with her hands. Pt states she was on a hair thinning product (zenagen) at the time. Pt no longer using this product.). ? ?Skin check, focus on reaction on her hands which reduced acute inflammation. ?Location:  ?Duration:  ?Quality:  ?Associated Signs/Symptoms: ?Modifying Factors:  ?Severity:  ?Timing: ?Context:  ? ? ?The following portions of the chart were reviewed this encounter and updated as appropriate:  Tobacco  Allergies  Meds  Problems  Med Hx  Surg Hx  Fam Hx   ?  ? ?Objective  ?Well appearing patient in no apparent distress; mood and affect are within normal limits. ?Waist up exam: No atypical pigmented lesions or normal skin cancer. ? ?Mid Back ?Several flattopped textured 4 to 6 mm brown papules, compatible dermoscopy ? ?Left Dorsal Hand ?Hornlike 5 mm pink crust ? ?Left Hand - Anterior, Right Hand - Anterior ?Patient provided photographs of her hands with edema, erythema, subacute inflammation.  Today this has resolved. ? ? ? ?All skin waist up examined.  Is beneath undergarments not fully examined. ? ? ?Assessment & Plan  ?Encounter for screening for malignant neoplasm of skin ? ?Annual skin examination ? ?Seborrheic keratosis ?Mid Back ? ?Leave if stable ? ?AK (actinic keratosis) ?Left Dorsal Hand ? ?Intervention deferred ? ?Allergic contact dermatitis due to other agents ?Left Hand - Anterior; Right Hand - Anterior ? ?I tried to do research on the ingredients in the products in [place] but could only find that it contains variety of botanicals and full list of ingredients not found with brief online research.  Since patient is not planning on reusing this product, it seems reasonable to hold on doing formal patch testing.  I suspect it is unlikely that the manufacturer of  Zenagen will send ingredients for patch testing. ? ? ?

## 2021-10-18 ENCOUNTER — Telehealth: Payer: Self-pay | Admitting: *Deleted

## 2021-10-18 NOTE — Telephone Encounter (Signed)
Called patient with Dr Coy Saunas name and phone number per dr tafeen recommendation.  ?

## 2021-10-24 ENCOUNTER — Other Ambulatory Visit: Payer: Self-pay | Admitting: Internal Medicine

## 2021-10-27 NOTE — Progress Notes (Signed)
?MEDICARE ANNUAL WELLNESS VISIT AND FOLLOW UP ? ?Assessment:  ? ?Diagnoses and all orders for this visit: ? ?Encounter for Medicare annual wellness exam ?Due annually , mammogram due-scheduled today.  ? ?Essential hypertension ?Labile; currently managed without medications - will continue to monitor and initiate treatment for persistently elevated BPs as indicated ?Monitor blood pressure at home; call if consistently over 130/80 ?Continue DASH diet.   ?Reminder to go to the ER if any CP, SOB, nausea, dizziness, severe HA, changes vision/speech, left arm numbness and tingling and jaw pain. ? ?Hypothyroidism, unspecified type ?continue medications the same ?reminded to take on an empty stomach 30-63mns before food.  ?-     TSH ? ?Mixed hyperlipidemia ?At goal; continue medications krill oil supplement ?Continue low cholesterol diet and exercise.  ?Lipid panel ? ?Vitamin D deficiency ?Continue supplementation ?Defer checking vitamin D level ? ?Prediabetes ?Discussed disease and risks ?Discussed diet/exercise, weight management  ?-  CMP ? ?Puckering of Macula, right ?Continue to follow with Dr. BValetta Close? ?Medication management ?-     CBC with Differential/Platelet ?-     CMP/GFR ? ?Onychomycosis ?Alternate 30 days on and 30 days off for Lamisil ?- CMP ? ?Osteopenia ?DEXA UTD, continue Vit D and Ca, reviewed weight bearing exercises ? ?Overweight  ?Long discussion about weight loss, diet, and exercise ?Recommended diet heavy in fruits and veggies and low in animal meats, cheeses, and dairy products, appropriate calorie intake ?Follow up in 6 months ? ? ?Over 40 minutes of exam, counseling, chart review and critical decision making was performed ?Future Appointments  ?Date Time Provider [place] ?08/01/2022  2:00 PM MUnk Pinto MD [facility] None  ?10/10/2022  1:30 PM TLavonna Monarch MD [facility]  ? ? ? ?Plan:  ? ?During the course of the visit the patient was educated and counseled about appropriate  screening and preventive services including:  ? ?Pneumococcal vaccine  ?Prevnar 13 ?Influenza vaccine ?Td vaccine ?Screening electrocardiogram ?Bone densitometry screening ?Colorectal cancer screening ?Diabetes screening ?Glaucoma screening ?Nutrition counseling  ?Advanced directives: requested ? ? ?Subjective:  ?VVermontis a remarkably healthy 81y.o. female who presents for Medicare Annual Wellness Visit and 3 month follow up.  ? ?she has a diagnosis of GERD which is currently managed by lifestyle modification ?she reports symptoms is currently well controlled, and denies breakthrough reflux, burning in chest, hoarseness or cough.   ? ?She is currently using Lamisil for toenail fungus of right middle toe.  No issues. Has not been taking a 30 day break after 30 days of use.  ? ?BMI is Body mass index is 26.7 kg/m?., she has been working on diet and exercise, will walk some on her property but not consistently, does a lot of garden.  ?Wt Readings from Last 3 Encounters:  ?10/30/21 146 lb (66.2 kg)  ?07/27/21 145 lb 6.4 oz (66 kg)  ?10/27/20 150 lb (68 kg)  ? ? She does have BP cuff, hasn't been checking, today their BP is BP: 138/64  ?BP Readings from Last 3 Encounters:  ?10/30/21 138/64  ?07/27/21 126/80  ?10/27/20 116/68  ?She does workout. She denies chest pain, shortness of breath, dizziness.  ? ? ?She is not on cholesterol medication and denies myalgias. Her cholesterol is not at goal. The cholesterol last visit was:   ?Lab Results  ?Component Value Date  ? CHOL 164 07/27/2021  ? HDL 49 (L) 07/27/2021  ? LWyanet92 07/27/2021  ? TRIG 134 07/27/2021  ? CHOLHDL  3.3 07/27/2021  ? ? She has been working on diet and exercise for prediabetes, and denies increased appetite, nausea, paresthesia of the feet, polydipsia, polyuria, visual disturbances and vomiting. Last A1C in the office was back within normal range:  ?Lab Results  ?Component Value Date  ? HGBA1C 5.9 (H) 07/27/2021  ? ?Last GFR: ?Lab Results   ?Component Value Date  ? GFRNONAA 75 10/27/2020  ? ?She is on thyroid medication. Her medication was not changed last visit. She reports taking 1/2 tab of 50 mcg daily.  ?Lab Results  ?Component Value Date  ? TSH 3.51 07/27/2021  ? ?Patient is on Vitamin D supplement and at goal at the last visit:    ?Lab Results  ?Component Value Date  ? VD25OH 91 07/27/2021  ?   ? ?Medication Review: ?Current Outpatient Medications on File Prior to Visit  ?Medication Sig Dispense Refill  ? aspirin 81 MG chewable tablet Chew by mouth daily.    ? Cholecalciferol (VITAMIN D PO) Take 10,000 Int'l Units by mouth daily.    ? Krill Oil 1000 MG CAPS Take by mouth daily.     ? levothyroxine (SYNTHROID) 50 MCG tablet Take 1/2 to 1 tablet daily as directed on an empty stomach with only water for 30 minutes & no Antacid meds, Calcium or Magnesium for 4 hours & avoid Biotin 90 tablet 1  ? OVER THE COUNTER MEDICATION daily. [facility] brand allergy pill    ? terbinafine (LAMISIL) 250 MG tablet Take  1 tablet  Daily  for Toenail Fungus 90 tablet 1  ? Zinc 50 MG CAPS Take by mouth daily.    ? ?No current facility-administered medications on file prior to visit.  ? ? ?No Known Allergies ? ?Current Problems (verified) ?Patient Active Problem List  ? Diagnosis Date Noted  ? Osteopenia 05/13/2019  ? Overweight (BMI 25.0-29.9) 04/18/2015  ? Medication management 06/22/2013  ? Hypertension   ? Hyperlipidemia   ? Vitamin D deficiency   ? Hypothyroidism   ? Abnormal glucose   ? ? ?Screening Tests ?Immunization History  ?Administered Date(s) Administered  ? DT (Pediatric) 04/08/2014  ? Influenza, High Dose Seasonal PF 04/18/2015, 05/15/2016, 05/22/2017, 06/20/2018, 04/21/2019, 05/02/2020, 04/10/2021  ? Influenza-Unspecified 04/21/2019  ? Moderna SARS-COV2 Booster Vaccination 10/24/2020  ? Moderna Sars-Covid-2 Vaccination 07/27/2019, 08/24/2019, 05/18/2020  ? Pension scheme manager 7yr & up 04/10/2021  ? Pneumococcal Conjugate-13 05/15/2016   ? Pneumococcal Polysaccharide-23 11/18/2006, 04/08/2014  ? Td 07/16/2002  ? Tdap 05/02/2020  ? Zoster Recombinat (Shingrix) 04/19/2021, 08/03/2021  ? Zoster, Live 01/29/2007  ? ?Preventative care: ?Last colonoscopy: 12/2006  ?Last cologuard: 07/2020 neg ?Last mammogram: 08/26/2020 ?Last pap smear/pelvic exam: 2011, will not have another ?DEXA: 05/18/21 osteopenia ? ?Prior vaccinations: ?TD or Tdap: 2015  ?Influenza: 04/2020 ?Pneumococcal: 2015 ?[id number] 2017 ?Shingles/Zostavax: 2008 ?Covid 19: 4/4, moderna, 2021 ? ?Names of Other Physician/Practitioners you currently use: ?1. [facility] ?[facility]. [facility] Dr. BValetta Close eye doctor, 04/2021 ?3. [facility]. HKary Kos dentist, last visit 06/2021 ?4. Dr. JRonnald Ramp derm, 05/2021 ? ?Patient Care Team: ?MUnk Pinto MD as PCP - General (Internal Medicine) ?KInda Castle MD (Inactive) as Consulting Physician (Gastroenterology) ?TMarygrace Drought MD as Consulting Physician (Ophthalmology) ?LRolm Bookbinder MD as Consulting Physician (Dermatology) ?TLavonna Monarch MD as Consulting Physician (Dermatology) ? ?SURGICAL HISTORY ?She  has a past surgical history that includes Appendectomy; Tonsillectomy; and Other surgical history (1981). ?FAMILY HISTORY ?Her family history includes Asthma in  her mother; COPD in her father; Cancer in her brother; Gout in her father; Heart disease in her father and mother. ?SOCIAL HISTORY ?She  reports that she has never smoked. She has never used smokeless tobacco. She reports current alcohol use. She reports that she does not use drugs. ? ? ?MEDICARE WELLNESS OBJECTIVES: ?Physical activity: Current Exercise Habits: The patient does not participate in regular exercise at present, Exercise limited by: None identified ?Cardiac risk factors: Cardiac Risk Factors include: advanced age (>6mn, >>16women);dyslipidemia;hypertension ?Depression/mood screen:   ? ?  10/30/2021  ?   3:52 PM  ?Depression screen PHQ 2/9  ?Decreased Interest 0  ?Down, Depressed, Hopeless 0  ?PHQ - 2 Score 0  ?  ?ADLs:  ? ?  10/30/2021  ?  3:53 PM  ?In your present state of health, do you have any difficult

## 2021-10-30 ENCOUNTER — Ambulatory Visit (INDEPENDENT_AMBULATORY_CARE_PROVIDER_SITE_OTHER): Payer: PPO | Admitting: Nurse Practitioner

## 2021-10-30 ENCOUNTER — Other Ambulatory Visit: Payer: Self-pay | Admitting: Internal Medicine

## 2021-10-30 ENCOUNTER — Encounter: Payer: Self-pay | Admitting: Nurse Practitioner

## 2021-10-30 VITALS — BP 138/64 | HR 80 | Temp 97.9°F | Wt 146.0 lb

## 2021-10-30 DIAGNOSIS — R6889 Other general symptoms and signs: Secondary | ICD-10-CM | POA: Diagnosis not present

## 2021-10-30 DIAGNOSIS — M85852 Other specified disorders of bone density and structure, left thigh: Secondary | ICD-10-CM

## 2021-10-30 DIAGNOSIS — E039 Hypothyroidism, unspecified: Secondary | ICD-10-CM

## 2021-10-30 DIAGNOSIS — B351 Tinea unguium: Secondary | ICD-10-CM | POA: Diagnosis not present

## 2021-10-30 DIAGNOSIS — H35371 Puckering of macula, right eye: Secondary | ICD-10-CM

## 2021-10-30 DIAGNOSIS — R7309 Other abnormal glucose: Secondary | ICD-10-CM

## 2021-10-30 DIAGNOSIS — E782 Mixed hyperlipidemia: Secondary | ICD-10-CM | POA: Diagnosis not present

## 2021-10-30 DIAGNOSIS — Z0000 Encounter for general adult medical examination without abnormal findings: Secondary | ICD-10-CM

## 2021-10-30 DIAGNOSIS — I10 Essential (primary) hypertension: Secondary | ICD-10-CM

## 2021-10-30 DIAGNOSIS — Z79899 Other long term (current) drug therapy: Secondary | ICD-10-CM | POA: Diagnosis not present

## 2021-10-30 DIAGNOSIS — Z0001 Encounter for general adult medical examination with abnormal findings: Secondary | ICD-10-CM

## 2021-10-30 DIAGNOSIS — E663 Overweight: Secondary | ICD-10-CM | POA: Diagnosis not present

## 2021-10-30 DIAGNOSIS — E559 Vitamin D deficiency, unspecified: Secondary | ICD-10-CM

## 2021-10-30 DIAGNOSIS — Z1231 Encounter for screening mammogram for malignant neoplasm of breast: Secondary | ICD-10-CM

## 2021-10-30 NOTE — Patient Instructions (Signed)
30 days on medication then 30 days off medication.  Alternate in this fashion ? ?Terbinafine Tablets ?What is this medication? ?TERBINAFINE (TER bin a feen) treats fungal infections of the nails. It belongs to a group of medications called antifungals. It will not treat infections caused by bacteria or viruses. ?This medicine may be used for other purposes; ask your health care provider or pharmacist if you have questions. ?COMMON BRAND NAME(S): Lamisil, Terbinex ?What should I tell my care team before I take this medication? ?They need to know if you have any of these conditions: ?Liver disease ?An unusual or allergic reaction to terbinafine, other medications, foods, dyes, or preservatives ?Pregnant or trying to get pregnant ?Breast-feeding ?How should I use this medication? ?Take this medication by mouth with water. Take it as directed on the prescription label at the same time every day. You can take it with or without food. If it upsets your stomach, take it with food. Keep taking it unless your care team tells you to stop. ?A special MedGuide will be given to you by the pharmacist with each prescription and refill. Be sure to read this information carefully each time. ?Talk to your care team regarding the use of this medication in children. Special care may be needed. ?Overdosage: If you think you have taken too much of this medicine contact a poison control center or emergency room at once. ?NOTE: This medicine is only for you. Do not share this medicine with others. ?What if I miss a dose? ?If you miss a dose, take it as soon as you can unless it is more than 4 hours late. If it is more than 4 hours late, skip the missed dose. Take the next dose at the normal time. ?What may interact with this medication? ?Do not take this medication with any of the following: ?Pimozide ?Thioridazine ?This medication may also interact with the following: ?Beta blockers ?Caffeine ?Certain medications for mental health  conditions ?Cimetidine ?Cyclosporine ?Medications for fungal infections like fluconazole and ketoconazole ?Medications for irregular heartbeat like amiodarone, flecainide and propafenone ?Rifampin ?Warfarin ?This list may not describe all possible interactions. Give your health care provider a list of all the medicines, herbs, non-prescription drugs, or dietary supplements you use. Also tell them if you smoke, drink alcohol, or use illegal drugs. Some items may interact with your medicine. ?What should I watch for while using this medication? ?Visit your care team for regular checks on your progress. You may need blood work while you are taking this medication. It may be some time before you see the benefit from this medication. ?This medication may cause serious skin reactions. They can happen weeks to months after starting the medication. Contact your care team right away if you notice fevers or flu-like symptoms with a rash. The rash may be red or purple and then turn into blisters or peeling of the skin. Or, you might notice a red rash with swelling of the face, lips or lymph nodes in your neck or under your arms. ?This medication can make you more sensitive to the sun. Keep out of the sun, If you cannot avoid being in the sun, wear protective clothing and sunscreen. Do not use sun lamps or tanning beds/booths. ?What side effects may I notice from receiving this medication? ?Side effects that you should report to your care team as soon as possible: ?Allergic reactions--skin rash, itching, hives, swelling of the face, lips, tongue, or throat ?Change in sense of smell ?Change in taste ?  Infection--fever, chills, cough, or sore throat ?Liver injury--right upper belly pain, loss of appetite, nausea, light-colored stool, dark yellow or brown urine, yellowing skin or eyes, unusual weakness or fatigue ?Low red blood cell level--unusual weakness or fatigue, dizziness, headache, trouble breathing ?Lupus-like  syndrome--joint pain, swelling, or stiffness, butterfly-shaped rash on the face, rashes that get worse in the sun, fever, unusual weakness or fatigue ?Rash, fever, and swollen lymph nodes ?Redness, blistering, peeling, or loosening of the skin, including inside the mouth ?Unusual bruising or bleeding ?Worsening mood, feelings of depression ?Side effects that usually do not require medical attention (report to your care team if they continue or are bothersome): ?Diarrhea ?Gas ?Headache ?Nausea ?Stomach pain ?Upset stomach ?This list may not describe all possible side effects. Call your doctor for medical advice about side effects. You may report side effects to FDA at 1-800-FDA-1088. ?Where should I keep my medication? ?Keep out of the reach of children and pets. ?Store between 20 and 25 degrees C (68 and 77 degrees F). Protect from light. Get rid of any unused medication after the expiration date. ?To get rid of medications that are no longer needed or have expired: ?Take the medication to a medication take-back program. Check with your pharmacy or law enforcement to find a location. ?If you cannot return the medication, check the label or package insert to see if the medication should be thrown out in the garbage or flushed down the toilet. If you are not sure, ask your care team. If it is safe to put it in the trash, take the medication out of the container. Mix the medication with cat litter, dirt, coffee grounds, or other unwanted substance. Seal the mixture in a bag or container. Put it in the trash. ?NOTE: This sheet is a summary. It may not cover all possible information. If you have questions about this medicine, talk to your doctor, pharmacist, or health care provider. ?? 2023 Elsevier/Gold Standard (2021-02-15 00:00:00) ? ?

## 2021-10-31 LAB — COMPLETE METABOLIC PANEL WITH GFR
AG Ratio: 1.5 (calc) (ref 1.0–2.5)
ALT: 19 U/L (ref 6–29)
AST: 19 U/L (ref 10–35)
Albumin: 4.3 g/dL (ref 3.6–5.1)
Alkaline phosphatase (APISO): 80 U/L (ref 37–153)
BUN: 19 mg/dL (ref 7–25)
CO2: 28 mmol/L (ref 20–32)
Calcium: 9.6 mg/dL (ref 8.6–10.4)
Chloride: 106 mmol/L (ref 98–110)
Creat: 0.74 mg/dL (ref 0.60–0.95)
Globulin: 2.8 g/dL (calc) (ref 1.9–3.7)
Glucose, Bld: 96 mg/dL (ref 65–99)
Potassium: 4.2 mmol/L (ref 3.5–5.3)
Sodium: 141 mmol/L (ref 135–146)
Total Bilirubin: 0.3 mg/dL (ref 0.2–1.2)
Total Protein: 7.1 g/dL (ref 6.1–8.1)
eGFR: 82 mL/min/{1.73_m2} (ref >=60)

## 2021-10-31 LAB — LIPID PANEL
Cholesterol: 165 mg/dL (ref <200)
HDL: 52 mg/dL (ref >=50)
LDL Cholesterol (Calc): 92 mg/dL (calc)
Non-HDL Cholesterol (Calc): 113 mg/dL (calc) (ref <130)
Total CHOL/HDL Ratio: 3.2 (calc) (ref <5.0)
Triglycerides: 114 mg/dL (ref <150)

## 2021-10-31 LAB — CBC WITH DIFFERENTIAL/PLATELET
Absolute Monocytes: 682 cells/uL (ref 200–950)
Basophils Absolute: 86 cells/uL (ref 0–200)
Basophils Relative: 0.9 %
Eosinophils Absolute: 211 cells/uL (ref 15–500)
Eosinophils Relative: 2.2 %
HCT: 41.6 % (ref 35.0–45.0)
Hemoglobin: 13.7 g/dL (ref 11.7–15.5)
Lymphs Abs: 2544 cells/uL (ref 850–3900)
MCH: 27.5 pg (ref 27.0–33.0)
MCHC: 32.9 g/dL (ref 32.0–36.0)
MCV: 83.4 fL (ref 80.0–100.0)
MPV: 10.1 fL (ref 7.5–12.5)
Monocytes Relative: 7.1 %
Neutro Abs: 6077 cells/uL (ref 1500–7800)
Neutrophils Relative %: 63.3 %
Platelets: 291 10*3/uL (ref 140–400)
RBC: 4.99 10*6/uL (ref 3.80–5.10)
RDW: 13.5 % (ref 11.0–15.0)
Total Lymphocyte: 26.5 %
WBC: 9.6 10*3/uL (ref 3.8–10.8)

## 2021-10-31 LAB — TSH: TSH: 3.12 mIU/L (ref 0.40–4.50)

## 2021-11-02 ENCOUNTER — Ambulatory Visit: Payer: PPO

## 2021-11-08 ENCOUNTER — Ambulatory Visit
Admission: RE | Admit: 2021-11-08 | Discharge: 2021-11-08 | Disposition: A | Discharge status: Home / Self Care | Payer: PPO | Source: Ambulatory Visit | Attending: Internal Medicine | Admitting: Internal Medicine

## 2021-11-08 DIAGNOSIS — Z1231 Encounter for screening mammogram for malignant neoplasm of breast: Secondary | ICD-10-CM

## 2021-11-18 ENCOUNTER — Other Ambulatory Visit: Payer: Self-pay | Admitting: Adult Health

## 2022-02-05 DIAGNOSIS — L218 Other seborrheic dermatitis: Secondary | ICD-10-CM | POA: Diagnosis not present

## 2022-02-05 DIAGNOSIS — L821 Other seborrheic keratosis: Secondary | ICD-10-CM | POA: Diagnosis not present

## 2022-02-05 DIAGNOSIS — L812 Freckles: Secondary | ICD-10-CM | POA: Diagnosis not present

## 2022-02-05 DIAGNOSIS — L308 Other specified dermatitis: Secondary | ICD-10-CM | POA: Diagnosis not present

## 2022-02-05 DIAGNOSIS — Z85828 Personal history of other malignant neoplasm of skin: Secondary | ICD-10-CM | POA: Diagnosis not present

## 2022-02-05 DIAGNOSIS — D1801 Hemangioma of skin and subcutaneous tissue: Secondary | ICD-10-CM | POA: Diagnosis not present

## 2022-02-05 DIAGNOSIS — L820 Inflamed seborrheic keratosis: Secondary | ICD-10-CM | POA: Diagnosis not present

## 2022-02-09 ENCOUNTER — Telehealth: Payer: Self-pay | Admitting: Nurse Practitioner

## 2022-02-09 NOTE — Telephone Encounter (Signed)
Pharmacy is filling Terbinafine for 24 tabs and will not give her 30 tabs, wanting to know if she is supposed to be taking it for only 24 days? But I also see on her med list that the prescription is for a 90 day supply? So not exactly sure what she is supposed to be taking or how. She also mentioned that you told her that she should be taking 30 days on and then 30 days off to avoid it affecting her kidneys.

## 2022-02-09 NOTE — Telephone Encounter (Signed)
She is to be taking on for 30 days then off for 30 days

## 2022-02-12 NOTE — Telephone Encounter (Signed)
Yes Im not sure why only 24 were given

## 2022-02-28 DIAGNOSIS — Z85828 Personal history of other malignant neoplasm of skin: Secondary | ICD-10-CM | POA: Diagnosis not present

## 2022-02-28 DIAGNOSIS — L308 Other specified dermatitis: Secondary | ICD-10-CM | POA: Diagnosis not present

## 2022-04-20 ENCOUNTER — Ambulatory Visit
Admission: RE | Admit: 2022-04-20 | Discharge: 2022-04-20 | Disposition: A | Discharge status: Home / Self Care | Payer: PPO | Source: Ambulatory Visit | Attending: Nurse Practitioner | Admitting: Nurse Practitioner

## 2022-04-20 ENCOUNTER — Ambulatory Visit (INDEPENDENT_AMBULATORY_CARE_PROVIDER_SITE_OTHER): Payer: PPO | Admitting: Nurse Practitioner

## 2022-04-20 ENCOUNTER — Encounter: Payer: Self-pay | Admitting: Nurse Practitioner

## 2022-04-20 VITALS — BP 148/84 | HR 77 | Temp 97.5°F | Ht 62.0 in | Wt 148.0 lb

## 2022-04-20 DIAGNOSIS — R5383 Other fatigue: Secondary | ICD-10-CM

## 2022-04-20 DIAGNOSIS — J00 Acute nasopharyngitis [common cold]: Secondary | ICD-10-CM

## 2022-04-20 DIAGNOSIS — R0981 Nasal congestion: Secondary | ICD-10-CM

## 2022-04-20 DIAGNOSIS — R051 Acute cough: Secondary | ICD-10-CM | POA: Diagnosis not present

## 2022-04-20 DIAGNOSIS — R0609 Other forms of dyspnea: Secondary | ICD-10-CM

## 2022-04-20 DIAGNOSIS — R059 Cough, unspecified: Secondary | ICD-10-CM | POA: Diagnosis not present

## 2022-04-20 MED ORDER — PROMETHAZINE-DM 6.25-15 MG/5ML PO SYRP: ORAL_SOLUTION | ORAL | 0 refills | Status: DC

## 2022-04-20 MED ORDER — AZITHROMYCIN 500 MG PO TABS: 500.0000 mg | ORAL_TABLET | Freq: Every day | ORAL | 0 refills | Status: AC

## 2022-04-20 NOTE — Progress Notes (Signed)
Assessment and Plan:  Anna Caldwell was seen today for an episodic visit.  Diagnoses and all order for this visit:  1. Acute nasopharyngitis Continue to stay well hydrated to keep mucus thin and productive.  - DG Chest 2 View; Future - azithromycin (ZITHROMAX) 500 MG tablet; Take 1 tablet (500 mg total) by mouth daily for 10 days. Take 1 tablet daily for 3 days.  Dispense: 10 tablet; Refill: 0  2. Acute cough  - DG Chest 2 View; Future - promethazine-dextromethorphan (PROMETHAZINE-DM) 6.25-15 MG/5ML syrup; Take 2.5 mLs by mouth up to 4 (four) times daily as needed for cough.  Dispense: 240 mL; Refill: 0  3. Nasal congestion Neti pot, Mucinex, antihistamine recommenced as needed. Stay well hydrated.  4. Other fatigue/DOE Rest  Report to ER for any increase in difficulty breathing.   Continue to monitor for any increase in fever, chills, N/V, difficulty breathing.  Notify office for further evaluation and treatment, questions or concerns if s/s fail to improve. The risks and benefits of my recommendations, as well as other treatment options were discussed with the patient today. Questions were answered.  Further disposition pending results of labs. Discussed med's effects and SE's.    Over 15 minutes of exam, counseling, chart review, and critical decision making was performed.   Future Appointments  Date Time Provider [place]  08/01/2022  2:00 PM Unk Pinto, MD [facility] None    ------------------------------------------------------------------------------------------------------------------   HPI BP (!) 148/84   Pulse 77   Temp (!) 97.5 F (36.4 C)   Ht '5\' 2"'$  (1.575 m)   Wt 148 lb (67.1 kg)   SpO2 90%   BMI 27.07 kg/m    Patient complains of symptoms of a URI. Symptoms include congestion, low grade fever, nasal congestion, shortness of breath, sinus pressure, sneezing, and wheezing. Onset of symptoms was 1 week ago, and has been unchanged  since that time. Treatment to date: none.  Reports being outdoors with family and friends with possible exposure x1 week ago.  Past Medical History:  Diagnosis Date   Arthritis    Hyperlipidemia    Hypertension    Prediabetes    Thyroid disease    Vitamin D deficiency      No Known Allergies  Current Outpatient Medications on File Prior to Visit  Medication Sig   aspirin 81 MG chewable tablet Chew by mouth daily.   Cholecalciferol (VITAMIN D PO) Take 10,000 Int'l Units by mouth daily.   Krill Oil 1000 MG CAPS Take by mouth daily.    levothyroxine (SYNTHROID) 50 MCG tablet Take 1/2 to 1  tablet daily as directed on an empty stomach with only water for 30 minutes & no Antacid meds, Calcium or Magnesium for 4 hours & avoid Biotin   OVER THE COUNTER MEDICATION daily. [employer] brand allergy pill   Zinc 50 MG CAPS Take by mouth daily.   terbinafine (LAMISIL) 250 MG tablet Take  1 tablet  Daily  for Toenail Fungus (Patient not taking: Reported on 04/20/2022)   No current facility-administered medications on file prior to visit.    ROS: all negative except what is noted in the HPI.   Physical Exam:  BP (!) 148/84   Pulse 77   Temp (!) 97.5 F (36.4 C)   Ht '5\' 2"'$  (1.575 m)   Wt 148 lb (67.1 kg)   SpO2 90%   BMI 27.07 kg/m   General Appearance: NAD.  Awake, conversant and cooperative. Eyes: PERRLA, EOMs intact.  Sclera white.  Conjunctiva without erythema. Sinuses: No frontal/maxillary tenderness.  No nasal discharge. Nares patent.  ENT/Mouth: Ext aud canals clear.  Bilateral TMs w/DOL and without erythema or bulging. Hearing intact.  Posterior pharynx without swelling or exudate.  Tonsils without swelling or erythema.  Neck: Supple.  No masses, nodules or thyromegaly. Respiratory: Effort is regular with non-labored breathing. Breath sounds are with scattered wheezing expiratory in upper posterior lung fields. Cardio: RRR with no MRGs. Brisk peripheral pulses without edema.  Abdomen:  Active BS in all four quadrants.  Soft and non-tender without guarding, rebound tenderness, hernias or masses. Lymphatics: Non tender without lymphadenopathy.  Musculoskeletal: Full ROM, 5/5 strength, normal ambulation.  No clubbing or cyanosis. Skin: Appropriate color for ethnicity. Warm without rashes, lesions, ecchymosis, ulcers.  Neuro: CN II-XII grossly normal. Normal muscle tone without cerebellar symptoms and intact sensation.   Psych: AO X 3,  appropriate mood and affect, insight and judgment.     Darrol Jump, NP 11:40 AM [facility]

## 2022-04-20 NOTE — Patient Instructions (Signed)

## 2022-06-13 IMAGING — MG MM DIGITAL SCREENING BILAT W/ TOMO AND CAD
8 series · 8 of 24 positions shown · non-contrast
Comparison: Previous exam(s).

CLINICAL DATA: Screening.

EXAM:
DIGITAL SCREENING BILATERAL MAMMOGRAM WITH TOMOSYNTHESIS AND CAD
TECHNIQUE: Bilateral screening digital craniocaudal and mediolateral oblique
mammograms were obtained. Bilateral screening digital breast
tomosynthesis was performed. The images were evaluated with
computer-aided detection.

[R MLO synth-2D]
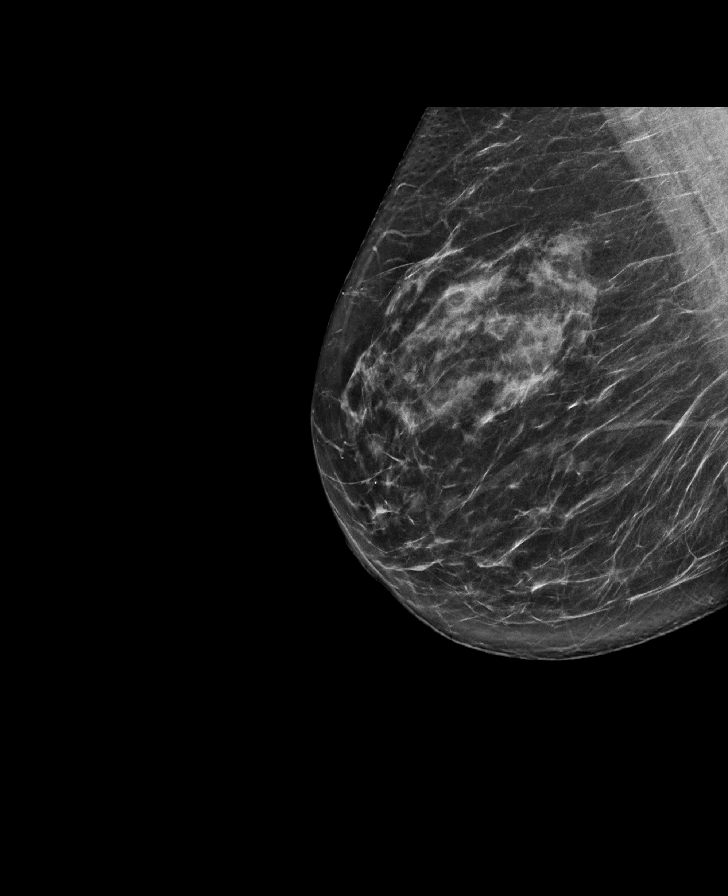

[R CC synth-2D]
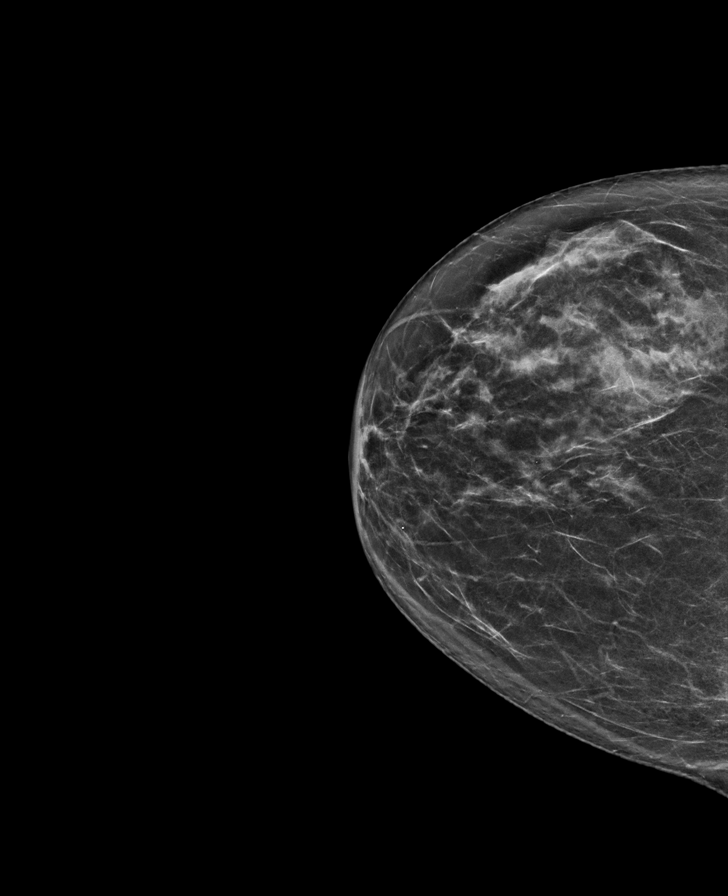

[L MLO synth-2D]
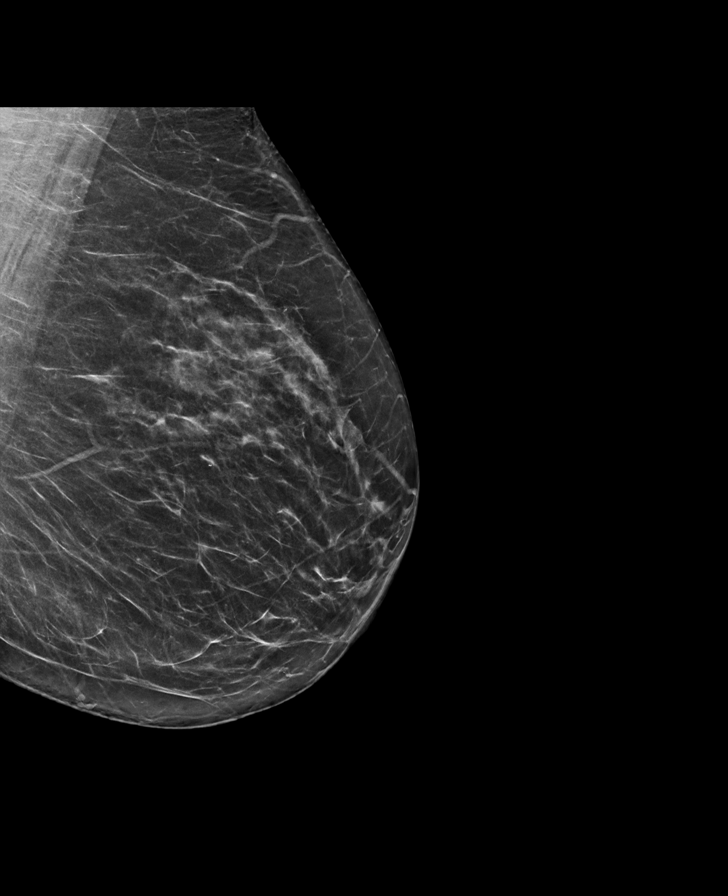

[L CC synth-2D]
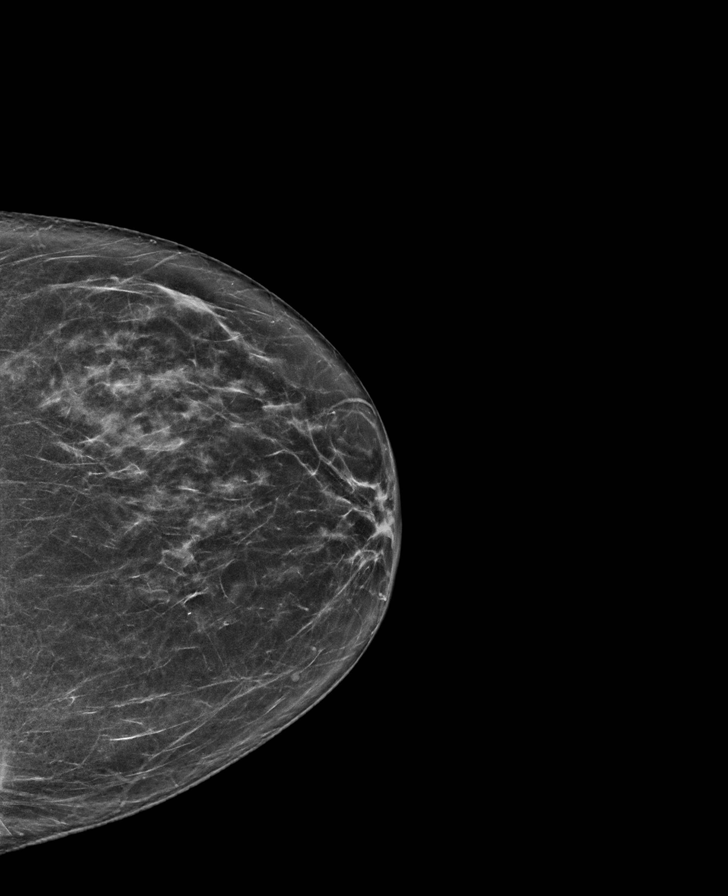

[L CC tomo · tomo slice 35/69.0]
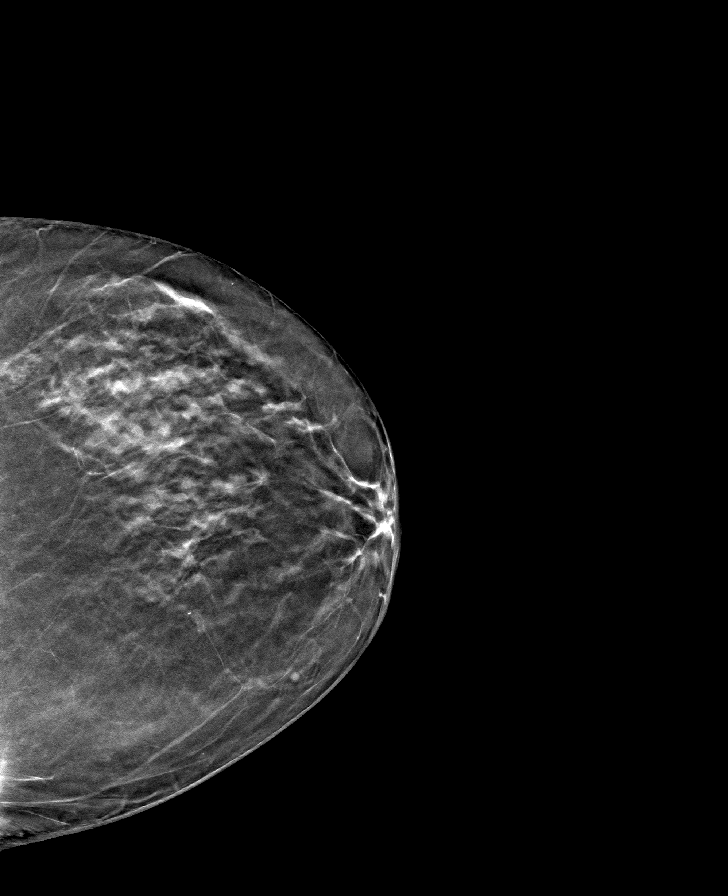

[L MLO tomo · tomo slice 36/71.0]
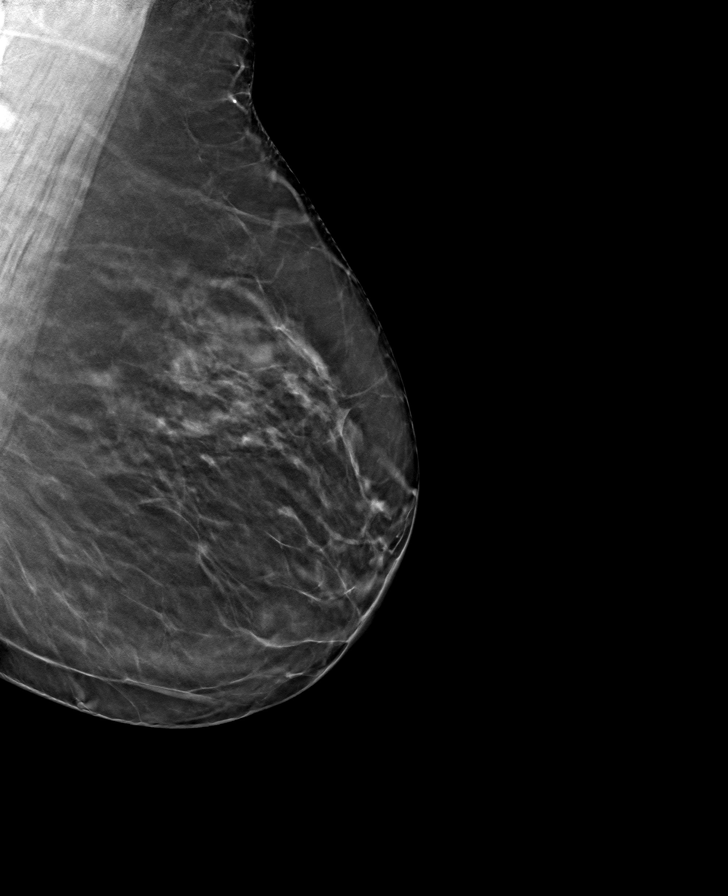

[R MLO tomo · tomo slice 37/72.0]
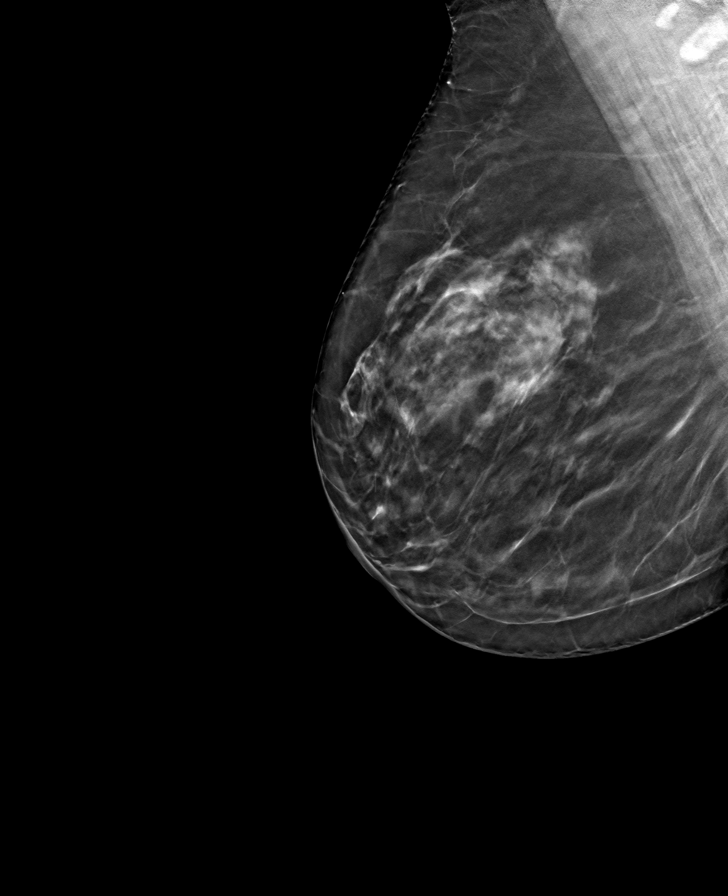

[R CC tomo · tomo slice 33/66.0]
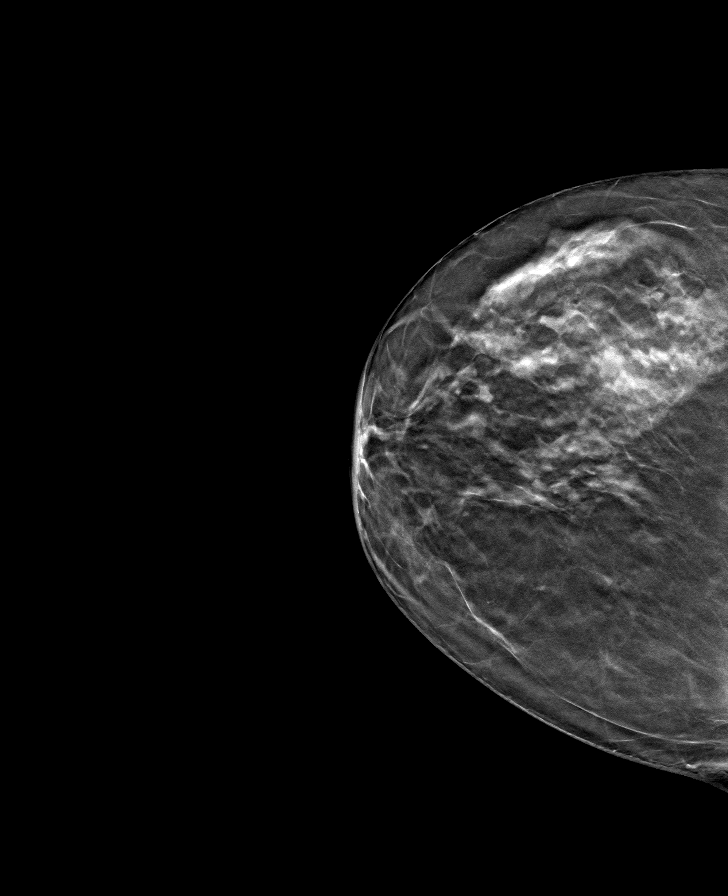

[8 of 24 positions shown; findings below may reference images not displayed]

ACR Breast Density Category c: The breast tissue is heterogeneously
dense, which may obscure small masses.
FINDINGS: There are no findings suspicious for malignancy.
IMPRESSION: No mammographic evidence of malignancy. A result letter of this
screening mammogram will be mailed directly to the patient.

RECOMMENDATION:
Screening mammogram in one year. (Code:Q3-W-BC3)

BI-RADS CATEGORY  1: Negative.

## 2022-06-25 ENCOUNTER — Other Ambulatory Visit: Payer: Self-pay | Admitting: Internal Medicine

## 2022-07-01 NOTE — Progress Notes (Unsigned)
     Future Appointments  Date Time Provider Department  07/02/2022  4:00 PM Unk Pinto, MD [facility]  08/01/2022  2:00 PM Unk Pinto, MD [facility]    History of Present Illness:                    This very nice 81 y.o. MWF with   HTN, HLD, Prediabetes  and Vitamin D Deficiency who presents wityh concerns re:L                         Medications  Current Outpatient Medications (Endocrine & Metabolic):    levothyroxine (SYNTHROID) 50 MCG tablet, Take 1/2 to 1  tablet daily as directed on an empty stomach with only water for 30 minutes & no Antacid meds, Calcium or Magnesium for 4 hours & avoid Biotin   Current Outpatient Medications (Respiratory):    promethazine-dextromethorphan (PROMETHAZINE-DM) 6.25-15 MG/5ML syrup, Take 2.5 mLs by mouth up to 4 (four) times daily as needed for cough.  Current Outpatient Medications (Analgesics):    aspirin 81 MG chewable tablet, Chew by mouth daily.   Current Outpatient Medications (Other):    Cholecalciferol (VITAMIN D PO), Take 10,000 Int'l Units by mouth daily.   Krill Oil 1000 MG CAPS, Take by mouth daily.    OVER THE COUNTER MEDICATION, daily. [employer] brand allergy pill   terbinafine (LAMISIL) 250 MG tablet, Take  1 tablet  Daily  for Toenail Fungus (Patient not taking: Reported on 04/20/2022)   Zinc 50 MG CAPS, Take by mouth daily.  Problem list She has Hypertension; Hyperlipidemia; Vitamin D deficiency; Hypothyroidism; Abnormal glucose; Medication management; Overweight (BMI 25.0-29.9); and Osteopenia on their problem list.   Observations/Objective:  There were no vitals taken for this visit.  HEENT - WNL. Neck - supple.  Chest - Clear equal BS. Cor - Nl HS. RRR w/o sig MGR. PP 1(+). No edema. MS- FROM w/o deformities.  Gait Nl. Neuro -  Nl w/o focal abnormalities.   Assessment and Plan:      Follow Up Instructions:        I discussed the assessment and treatment plan with the patient. The patient was  provided an opportunity to ask questions and all were answered. The patient agreed with the plan and demonstrated an understanding of the instructions.       The patient was advised to call back or seek an in-person evaluation if the symptoms worsen or if the condition fails to improve as anticipated.    Kirtland Bouchard, MD

## 2022-07-01 NOTE — Patient Instructions (Signed)

## 2022-07-02 ENCOUNTER — Encounter: Payer: Self-pay | Admitting: Internal Medicine

## 2022-07-02 ENCOUNTER — Ambulatory Visit (INDEPENDENT_AMBULATORY_CARE_PROVIDER_SITE_OTHER): Payer: PPO | Admitting: Internal Medicine

## 2022-07-02 ENCOUNTER — Other Ambulatory Visit: Payer: Self-pay | Admitting: Internal Medicine

## 2022-07-02 VITALS — BP 130/78 | HR 81 | Temp 97.9°F | Resp 16 | Ht 62.0 in | Wt 150.8 lb

## 2022-07-02 DIAGNOSIS — M2550 Pain in unspecified joint: Secondary | ICD-10-CM | POA: Diagnosis not present

## 2022-07-02 DIAGNOSIS — Z79899 Other long term (current) drug therapy: Secondary | ICD-10-CM

## 2022-07-02 DIAGNOSIS — B351 Tinea unguium: Secondary | ICD-10-CM | POA: Diagnosis not present

## 2022-07-02 MED ORDER — TERBINAFINE HCL 250 MG PO TABS: ORAL_TABLET | ORAL | 0 refills | Status: DC

## 2022-07-03 NOTE — Progress Notes (Signed)
<><><><><><><><><><><><><><><><><><><><><><><><><><><><><><><><><> <><><><><><><><><><><><><><><><><><><><><><><><><><><><><><><><><> -   Test results slightly outside the reference range are not unusual. If there is anything important, I will review this with you,  otherwise it is considered normal test values.  If you have further questions,  please do not hesitate to contact me at the office or via My Chart.  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 & OK   - So Your arthritic complaints are due to what's called                                                " DJD " or Degenerative Joint Disease  &                                                   is also known as " OA " or Osteoarthritis                                                       Which is the common ordinary                                                       age-related " wear  & tear " type arthritis  <><><><><><><><><><><><><><><><><><><><><><><><><><><><><><><><><> <><><><><><><><><><><><><><><><><><><><><><><><><><><><><><><><><>  - All Else - Kidneys - Electrolytes - Liver  -  also - all  Normal / OK  <><><><><><><><><><><><><><><><><><><><><><><><><><><><><><><><><> <><><><><><><><><><><><><><><><><><><><><><><><><><><><><><><><><>

## 2022-07-05 LAB — COMPLETE METABOLIC PANEL WITH GFR
AG Ratio: 1.6 (calc) (ref 1.0–2.5)
ALT: 20 U/L (ref 6–29)
AST: 20 U/L (ref 10–35)
Albumin: 4.1 g/dL (ref 3.6–5.1)
Alkaline phosphatase (APISO): 75 U/L (ref 37–153)
BUN: 22 mg/dL (ref 7–25)
CO2: 28 mmol/L (ref 20–32)
Calcium: 9.4 mg/dL (ref 8.6–10.4)
Chloride: 108 mmol/L (ref 98–110)
Creat: 0.74 mg/dL (ref 0.60–0.95)
Globulin: 2.6 g/dL (calc) (ref 1.9–3.7)
Glucose, Bld: 104 mg/dL — ABNORMAL HIGH (ref 65–99)
Potassium: 4.1 mmol/L (ref 3.5–5.3)
Sodium: 143 mmol/L (ref 135–146)
Total Bilirubin: 0.2 mg/dL (ref 0.2–1.2)
Total Protein: 6.7 g/dL (ref 6.1–8.1)
eGFR: 82 mL/min/{1.73_m2} (ref >=60)

## 2022-07-05 LAB — RHEUMATOID FACTOR: Rheumatoid fact SerPl-aCnc: <14 IU/mL (ref <14)

## 2022-07-05 LAB — SEDIMENTATION RATE: Sed Rate: 6 mm/h (ref 0–30)

## 2022-07-05 LAB — C-REACTIVE PROTEIN: CRP: 1.1 mg/L (ref <8.0)

## 2022-07-05 LAB — CYCLIC CITRUL PEPTIDE ANTIBODY, IGG: Cyclic Citrullin Peptide Ab: <16 UNITS

## 2022-08-01 ENCOUNTER — Ambulatory Visit (INDEPENDENT_AMBULATORY_CARE_PROVIDER_SITE_OTHER): Payer: PPO | Admitting: Internal Medicine

## 2022-08-01 ENCOUNTER — Encounter: Payer: Self-pay | Admitting: Internal Medicine

## 2022-08-01 VITALS — BP 138/80 | HR 63 | Temp 97.9°F | Resp 16 | Ht 62.0 in | Wt 149.6 lb

## 2022-08-01 DIAGNOSIS — E559 Vitamin D deficiency, unspecified: Secondary | ICD-10-CM

## 2022-08-01 DIAGNOSIS — I10 Essential (primary) hypertension: Secondary | ICD-10-CM | POA: Diagnosis not present

## 2022-08-01 DIAGNOSIS — E782 Mixed hyperlipidemia: Secondary | ICD-10-CM | POA: Diagnosis not present

## 2022-08-01 DIAGNOSIS — Z79899 Other long term (current) drug therapy: Secondary | ICD-10-CM | POA: Diagnosis not present

## 2022-08-01 DIAGNOSIS — E039 Hypothyroidism, unspecified: Secondary | ICD-10-CM | POA: Diagnosis not present

## 2022-08-01 DIAGNOSIS — Z1211 Encounter for screening for malignant neoplasm of colon: Secondary | ICD-10-CM

## 2022-08-01 DIAGNOSIS — Z0000 Encounter for general adult medical examination without abnormal findings: Secondary | ICD-10-CM | POA: Diagnosis not present

## 2022-08-01 DIAGNOSIS — R7303 Prediabetes: Secondary | ICD-10-CM

## 2022-08-01 DIAGNOSIS — R7309 Other abnormal glucose: Secondary | ICD-10-CM | POA: Diagnosis not present

## 2022-08-01 DIAGNOSIS — B351 Tinea unguium: Secondary | ICD-10-CM

## 2022-08-01 DIAGNOSIS — Z136 Encounter for screening for cardiovascular disorders: Secondary | ICD-10-CM

## 2022-08-01 DIAGNOSIS — Z0001 Encounter for general adult medical examination with abnormal findings: Secondary | ICD-10-CM

## 2022-08-01 DIAGNOSIS — Z8249 Family history of ischemic heart disease and other diseases of the circulatory system: Secondary | ICD-10-CM

## 2022-08-01 MED ORDER — TERBINAFINE HCL 250 MG PO TABS: ORAL_TABLET | ORAL | 1 refills | Status: DC

## 2022-08-01 NOTE — Patient Instructions (Signed)

## 2022-08-01 NOTE — Progress Notes (Signed)
Future Appointments  Date Time Provider Department  08/01/2022  2:00 PM Unk Pinto, MD [facility]  08/06/2023  2:00 PM Alycia Rossetti, NP [facility]    Annual  Screening/Preventative Visit  & Comprehensive Evaluation & Examination       This very nice 82 y.o. MWF with HTN, HLD, Prediabetes  and Vitamin D Deficiency who presents for a  comprehensive evaluation and management of multiple medical co-morbidities.        Patient has onychomycosis of toenails and has been on treatment w/o resolution & requests refill of her Lamisil.        Labile HTN predates since 2008 . Patient's BP has been controlled at home and patient denies any cardiac symptoms as chest pain, palpitations, shortness of breath, dizziness or ankle swelling. Today's BP is at goal  -  138/80.        Patient's hyperlipidemia is controlled with diet myalgias or other medication SE's. Last lipids were at goal  :  Lab Results  Component Value Date   CHOL 165 10/30/2021   HDL 52 10/30/2021   LDLCALC 92 10/30/2021   TRIG 114 10/30/2021   CHOLHDL 3.2 10/30/2021         Patient has hx/o prediabetes (A1c 6.2% /2011) and patient denies reactive hypoglycemic symptoms, visual blurring, diabetic polys or paresthesias. Last A1c was near goal :  Lab Results  Component Value Date   HGBA1C 5.9 (H) 07/27/2021          Patient was dx'd Hypothyroid in 2014 &  was initiated on thyroid replacement.         Finally, patient has history of Vitamin D Deficiency  ("13" /2008)  and last Vitamin D was at goal :  Lab Results  Component Value Date   VD25OH 91 07/27/2021       Current Outpatient Medications:     aspirin 81 MG chewable tablet, Chew  daily   VITAMIN D 10,000 Units , Take daily   Krill Oil 1000 MG CAPS, Take  daily   levothyroxine  50 MCG tablet, Take 1/2 to 1  tablet daily    [employer] brand allergy pill,     terbinafine  250 MG tablet, Take 1 tablet Daily for Nail Fungus,   Zinc 50 MG CAPS, Take   daily.    No Known Allergies   Past Medical History:  Diagnosis Date   Arthritis    Hyperlipidemia    Hypertension    Prediabetes    Thyroid disease    Vitamin D deficiency     Health Maintenance  Topic Date Due   Zoster Vaccines- Shingrix (1 of 2) Never done   COVID-19 Vaccine (4 - Booster for Moderna series) 12/19/2020   INFLUENZA VACCINE  02/13/2021   TETANUS/TDAP  05/02/2030   Pneumonia Vaccine 51+ Years old  Completed   DEXA AN  Completed   HPV VACCINES  Aged Out   Hepatitis C Screening  Discontinued     Immunization History  Administered Date(s) Administered   DT (Pediatric) 04/08/2014   Influenza, High Dose  06/20/2018, 04/21/2019, 05/02/2020   Influenza 04/21/2019   Moderna SARS-COV2  Vacc 10/24/2020   Moderna Sars-Covid-2 Vacc 07/27/2019, 08/24/2019, 05/18/2020   Pneumococcal -13 05/15/2016   Pneumococcal -23 11/18/2006, 04/08/2014   Td 07/16/2002   Tdap 05/02/2020   Zoster, Live 01/29/2007    Last Colon - 01/13/2007 - Deatra Ina - recc 10 yr f/u   Cologard  08/03/2017   - Negative  Cologard  07/30/2020  -  Negative - recc 3 year f/u due Jan 2025  Last MGM - 08/31/2020   Past Surgical History:  Procedure Laterality Date   APPENDECTOMY     OTHER SURGICAL HISTORY  1981   BTL    TONSILLECTOMY       Family History  Problem Relation Age of Onset   Heart disease Mother    Asthma Mother    COPD Father    Heart disease Father    Gout Father    Cancer Brother    Breast cancer Neg Hx      Social History   Tobacco Use   Smoking status: Never   Smokeless tobacco: Never  Substance Use Topics   Alcohol use: Yes    Comment: rarely   Drug use: No      ROS Constitutional: Denies fever, chills, weight loss/gain, headaches, insomnia,  night sweats, and change in appetite. Does c/o fatigue. Eyes: Denies redness, blurred vision, diplopia, discharge, itchy, watery eyes.  ENT: Denies discharge, congestion, post nasal drip, epistaxis, sore  throat, earache, hearing loss, dental pain, Tinnitus, Vertigo, Sinus pain, snoring.  Cardio: Denies chest pain, palpitations, irregular heartbeat, syncope, dyspnea, diaphoresis, orthopnea, PND, claudication, edema Respiratory: denies cough, dyspnea, DOE, pleurisy, hoarseness, laryngitis, wheezing.  Gastrointestinal: Denies dysphagia, heartburn, reflux, water brash, pain, cramps, nausea, vomiting, bloating, diarrhea, constipation, hematemesis, melena, hematochezia, jaundice, hemorrhoids Genitourinary: Denies dysuria, frequency, urgency, nocturia, hesitancy, discharge, hematuria, flank pain Breast: Breast lumps, nipple discharge, bleeding.  Musculoskeletal: Denies arthralgia, myalgia, stiffness, Jt. Swelling, pain, limp, and strain/sprain. Denies falls. Skin: Denies puritis, rash, hives, warts, acne, eczema, changing in skin lesion Neuro: No weakness, tremor, incoordination, spasms, paresthesia, pain Psychiatric: Denies confusion, memory loss, sensory loss. Denies Depression. Endocrine: Denies change in weight, skin, hair change, nocturia, and paresthesia, diabetic polys, visual blurring, hyper / hypo glycemic episodes.  Heme/Lymph: No excessive bleeding, bruising, enlarged lymph nodes.  Physical Exam  BP 138/80   Pulse 63   Temp 97.9 F (36.6 C)   Resp 16   Ht '5\' 2"'$  (1.575 m)   Wt 149 lb 9.6 oz (67.9 kg)   SpO2 99%   BMI 27.36 kg/m   General Appearance: Well nourished, well groomed and in no apparent distress.  Eyes: PERRLA, EOMs, conjunctiva no swelling or erythema, normal fundi and vessels. Sinuses: No frontal/maxillary tenderness ENT/Mouth: EACs patent / TMs  nl. Nares clear without erythema, swelling, mucoid exudates. Oral hygiene is good. No erythema, swelling, or exudate. Tongue normal, non-obstructing. Tonsils not swollen or erythematous. Hearing normal.  Neck: Supple, thyroid not palpable. No bruits, nodes or JVD. Respiratory: Respiratory effort normal.  BS equal and clear  bilateral without rales, rhonci, wheezing or stridor. Cardio: Heart sounds are normal with regular rate and rhythm and no murmurs, rubs or gallops. Peripheral pulses are normal and equal bilaterally without edema. No aortic or femoral bruits. Chest: symmetric with normal excursions and percussion. Breasts: Symmetric, without lumps, nipple discharge, retractions, or fibrocystic changes.  Abdomen: Flat, soft with bowel sounds active. Nontender, no guarding, rebound, hernias, masses, or organomegaly.  Lymphatics: Non tender without lymphadenopathy.  Genitourinary:  Musculoskeletal: Full ROM all peripheral extremities, joint stability, 5/5 strength, and normal gait. Skin: Warm and dry without rashes, lesions, cyanosis, clubbing or  ecchymosis.  Neuro: Cranial nerves intact, reflexes equal bilaterally. Normal muscle tone, no cerebellar symptoms. Sensation intact.  Pysch: Alert and oriented X 3, normal affect, Insight and Judgment appropriate.    Assessment and Plan  1.  Annual Preventative Screening Examination   2. Essential hypertension  - EKG 12-Lead - Urinalysis, Routine w reflex microscopic - Microalbumin / creatinine urine ratio - CBC with Differential/Platelet - COMPLETE METABOLIC PANEL WITH GFR - Magnesium - TSH  3. Hyperlipidemia, mixed  - EKG 12-Lead - Lipid panel - TSH  4. Abnormal glucose  - EKG 12-Lead - HM DIABETES FOOT EXAM - PR LOW EXTEMITY NEUR EXAM DOCUM - Insulin, random - Hemoglobin A1c  5. Vitamin D deficiency  - VITAMIN D 25 Hydroxy   6. Prediabetes  - Insulin, random - Hemoglobin A1c  7. Hypothyroidism, unspecified type  - TSH  8. Screening for colorectal cancer  - POC Hemoccult Bld/Stl 9. Screening for ischemic heart disease  - EKG 12-Lead  10. FHx: heart disease  - EKG 12-Lead  11. Medication management  - Urinalysis, Routine w reflex microscopic - Microalbumin / creatinine urine ratio - CBC with Differential/Platelet -  COMPLETE METABOLIC PANEL WITH GFR - Magnesium - Lipid panel - TSH - Insulin, random - VITAMIN D 25 Hydroxy - Hemoglobin A1c           Patient was counseled in prudent diet to achieve/maintain BMI less than 25 for weight control, BP monitoring, regular exercise and medications. Discussed med's effects and SE's. Screening labs and tests as requested with regular follow-up as recommended. Over 40 minutes of exam, counseling, chart review and high complex critical decision making was performed. Lamisil is refilled with recommendations to take alternate months.    Kirtland Bouchard, MD

## 2022-08-02 LAB — CBC WITH DIFFERENTIAL/PLATELET
Absolute Monocytes: 605 cells/uL (ref 200–950)
Basophils Absolute: 101 cells/uL (ref 0–200)
Basophils Relative: 1.2 %
Eosinophils Absolute: 193 cells/uL (ref 15–500)
Eosinophils Relative: 2.3 %
HCT: 42 % (ref 35.0–45.0)
Hemoglobin: 14 g/dL (ref 11.7–15.5)
Lymphs Abs: 2755 cells/uL (ref 850–3900)
MCH: 27.9 pg (ref 27.0–33.0)
MCHC: 33.3 g/dL (ref 32.0–36.0)
MCV: 83.8 fL (ref 80.0–100.0)
MPV: 10.3 fL (ref 7.5–12.5)
Monocytes Relative: 7.2 %
Neutro Abs: 4746 cells/uL (ref 1500–7800)
Neutrophils Relative %: 56.5 %
Platelets: 292 10*3/uL (ref 140–400)
RBC: 5.01 10*6/uL (ref 3.80–5.10)
RDW: 13.3 % (ref 11.0–15.0)
Total Lymphocyte: 32.8 %
WBC: 8.4 10*3/uL (ref 3.8–10.8)

## 2022-08-02 LAB — COMPLETE METABOLIC PANEL WITH GFR
AG Ratio: 1.5 (calc) (ref 1.0–2.5)
ALT: 14 U/L (ref 6–29)
AST: 18 U/L (ref 10–35)
Albumin: 4.3 g/dL (ref 3.6–5.1)
Alkaline phosphatase (APISO): 75 U/L (ref 37–153)
BUN: 15 mg/dL (ref 7–25)
CO2: 28 mmol/L (ref 20–32)
Calcium: 9.8 mg/dL (ref 8.6–10.4)
Chloride: 106 mmol/L (ref 98–110)
Creat: 0.74 mg/dL (ref 0.60–0.95)
Globulin: 2.9 g/dL (calc) (ref 1.9–3.7)
Glucose, Bld: 86 mg/dL (ref 65–99)
Potassium: 4 mmol/L (ref 3.5–5.3)
Sodium: 143 mmol/L (ref 135–146)
Total Bilirubin: 0.4 mg/dL (ref 0.2–1.2)
Total Protein: 7.2 g/dL (ref 6.1–8.1)
eGFR: 82 mL/min/{1.73_m2} (ref >=60)

## 2022-08-02 LAB — URINALYSIS, ROUTINE W REFLEX MICROSCOPIC
Bilirubin Urine: NEGATIVE
Glucose, UA: NEGATIVE
Hgb urine dipstick: NEGATIVE
Ketones, ur: NEGATIVE
Leukocytes,Ua: NEGATIVE
Nitrite: NEGATIVE
Protein, ur: NEGATIVE
Specific Gravity, Urine: 1.007 (ref 1.001–1.035)
pH: 6.5 (ref 5.0–8.0)

## 2022-08-02 LAB — LIPID PANEL
Cholesterol: 177 mg/dL (ref <200)
HDL: 51 mg/dL (ref >=50)
LDL Cholesterol (Calc): 102 mg/dL (calc) — ABNORMAL HIGH
Non-HDL Cholesterol (Calc): 126 mg/dL (calc) (ref <130)
Total CHOL/HDL Ratio: 3.5 (calc) (ref <5.0)
Triglycerides: 142 mg/dL (ref <150)

## 2022-08-02 LAB — HEMOGLOBIN A1C
Hgb A1c MFr Bld: 6 %{Hb} — ABNORMAL HIGH (ref <5.7)
Mean Plasma Glucose: 126 mg/dL
eAG (mmol/L): 7 mmol/L

## 2022-08-02 LAB — VITAMIN D 25 HYDROXY (VIT D DEFICIENCY, FRACTURES): Vit D, 25-Hydroxy: 90 ng/mL (ref 30–100)

## 2022-08-02 LAB — TSH: TSH: 4.82 m[IU]/L — ABNORMAL HIGH (ref 0.40–4.50)

## 2022-08-02 LAB — MAGNESIUM: Magnesium: 2 mg/dL (ref 1.5–2.5)

## 2022-08-02 LAB — INSULIN, RANDOM: Insulin: 7.7 u[IU]/mL

## 2022-08-02 LAB — MICROALBUMIN / CREATININE URINE RATIO
Creatinine, Urine: 28 mg/dL (ref 20–275)
Microalb, Ur: <0.2 mg/dL

## 2022-08-21 DIAGNOSIS — H5202 Hypermetropia, left eye: Secondary | ICD-10-CM | POA: Diagnosis not present

## 2022-08-21 DIAGNOSIS — H2513 Age-related nuclear cataract, bilateral: Secondary | ICD-10-CM | POA: Diagnosis not present

## 2022-08-21 DIAGNOSIS — H3562 Retinal hemorrhage, left eye: Secondary | ICD-10-CM | POA: Diagnosis not present

## 2022-09-03 ENCOUNTER — Other Ambulatory Visit: Payer: Self-pay

## 2022-09-03 DIAGNOSIS — Z1211 Encounter for screening for malignant neoplasm of colon: Secondary | ICD-10-CM

## 2022-09-03 DIAGNOSIS — Z1212 Encounter for screening for malignant neoplasm of rectum: Secondary | ICD-10-CM | POA: Diagnosis not present

## 2022-09-03 LAB — POC HEMOCCULT BLD/STL (HOME/3-CARD/SCREEN)
Card #2 Fecal Occult Blod, POC: NEGATIVE
Card #3 Fecal Occult Blood, POC: NEGATIVE
Fecal Occult Blood, POC: NEGATIVE

## 2022-09-26 ENCOUNTER — Other Ambulatory Visit: Payer: Self-pay | Admitting: Internal Medicine

## 2022-09-26 DIAGNOSIS — Z1231 Encounter for screening mammogram for malignant neoplasm of breast: Secondary | ICD-10-CM

## 2022-10-10 ENCOUNTER — Ambulatory Visit: Payer: PPO | Admitting: Dermatology

## 2022-11-12 ENCOUNTER — Ambulatory Visit
Admission: RE | Admit: 2022-11-12 | Discharge: 2022-11-12 | Disposition: A | Discharge status: Home / Self Care | Payer: PPO | Source: Ambulatory Visit | Attending: Internal Medicine | Admitting: Internal Medicine

## 2022-11-12 DIAGNOSIS — Z1231 Encounter for screening mammogram for malignant neoplasm of breast: Secondary | ICD-10-CM | POA: Diagnosis not present

## 2022-11-15 ENCOUNTER — Ambulatory Visit: Payer: PPO | Admitting: Nurse Practitioner

## 2022-11-23 DIAGNOSIS — H3562 Retinal hemorrhage, left eye: Secondary | ICD-10-CM | POA: Diagnosis not present

## 2022-11-23 NOTE — Progress Notes (Unsigned)
MEDICARE ANNUAL WELLNESS VISIT AND FOLLOW UP  Assessment:   Diagnoses and all orders for this visit:  Encounter for Medicare annual wellness exam Due annually , mammogram due-scheduled today.   Essential hypertension Labile; currently managed without medications - will continue to monitor and initiate treatment for persistently elevated BPs as indicated Monitor blood pressure at home; call if consistently over 130/80 Continue DASH diet.   Reminder to go to the ER if any CP, SOB, nausea, dizziness, severe HA, changes vision/speech, left arm numbness and tingling and jaw pain.  Hypothyroidism, unspecified type continue medications the same reminded to take on an empty stomach 30-22mins before food.  -     TSH  Mixed hyperlipidemia At goal; continue medications krill oil supplement Continue low cholesterol diet and exercise.  Lipid panel  Vitamin D deficiency Continue supplementation Defer checking vitamin D level  Prediabetes Discussed disease and risks Discussed diet/exercise, weight management  -  CMP  Puckering of Macula, right Continue to follow with Dr. Cathey Endow  Medication management -     CBC with Differential/Platelet -     CMP/GFR  Onychomycosis Alternate 30 days on and 30 days off for Lamisil - CMP  Osteopenia DEXA UTD, continue Vit D and Ca, reviewed weight bearing exercises  Overweight  Long discussion about weight loss, diet, and exercise Recommended diet heavy in fruits and veggies and low in animal meats, cheeses, and dairy products, appropriate calorie intake Follow up in 6 months   Over 40 minutes of exam, counseling, chart review and critical decision making was performed Future Appointments  Date Time Provider Department Center  11/26/2022  2:00 PM Raynelle Dick, NP [facility] None  02/18/2023 11:30 AM Lucky Cowboy, MD [facility] None  05/22/2023 11:30 AM Raynelle Dick, NP [facility] None  08/22/2023 10:00 AM Lucky Cowboy, MD  [facility] None     Plan:   During the course of the visit the patient was educated and counseled about appropriate screening and preventive services including:   Pneumococcal vaccine  Prevnar 13 Influenza vaccine Td vaccine Screening electrocardiogram Bone densitometry screening Colorectal cancer screening Diabetes screening Glaucoma screening Nutrition counseling  Advanced directives: requested   Subjective:  Anna Caldwell is a remarkably healthy 82 y.o. female who presents for Medicare Annual Wellness Visit and 3 month follow up.   she has a diagnosis of GERD which is currently managed by lifestyle modification she reports symptoms is currently well controlled, and denies breakthrough reflux, burning in chest, hoarseness or cough.    She is currently using Lamisil for toenail fungus of right middle toe.  No issues. Has not been taking a 30 day break after 30 days of use.   BMI is There is no height or weight on file to calculate BMI., she has been working on diet and exercise, will walk some on her property but not consistently, does a lot of garden.  Wt Readings from Last 3 Encounters:  08/01/22 149 lb 9.6 oz (67.9 kg)  07/02/22 150 lb 12.8 oz (68.4 kg)  04/20/22 148 lb (67.1 kg)    She does have BP cuff, hasn't been checking, today their BP is    BP Readings from Last 3 Encounters:  08/01/22 138/80  07/02/22 130/78  04/20/22 (!) 148/84  She does workout. She denies chest pain, shortness of breath, dizziness.    She is not on cholesterol medication and denies myalgias. Her cholesterol is not at goal. The cholesterol last visit was:   Lab Results  Component Value Date   CHOL 177 08/01/2022   HDL 51 08/01/2022   LDLCALC 102 (H) 08/01/2022   TRIG 142 08/01/2022   CHOLHDL 3.5 08/01/2022    She has been working on diet and exercise for prediabetes, and denies increased appetite, nausea, paresthesia of the feet, polydipsia, polyuria, visual disturbances and  vomiting. Last A1C in the office was back within normal range:  Lab Results  Component Value Date   HGBA1C 6.0 (H) 08/01/2022   Last GFR: Lab Results  Component Value Date   GFRNONAA 75 10/27/2020   She is on thyroid medication. Her medication was not changed last visit. She reports taking 1/2 tab of 50 mcg daily.  Lab Results  Component Value Date   TSH 4.82 (H) 08/01/2022   Patient is on Vitamin D supplement and at goal at the last visit:    Lab Results  Component Value Date   VD25OH 90 08/01/2022      Medication Review: Current Outpatient Medications on File Prior to Visit  Medication Sig Dispense Refill   aspirin 81 MG chewable tablet Chew by mouth daily.     Cholecalciferol (VITAMIN D PO) Take 10,000 Int'l Units by mouth daily.     Krill Oil 1000 MG CAPS Take by mouth daily.      levothyroxine (SYNTHROID) 50 MCG tablet Take 1/2 to 1  tablet daily as directed on an empty stomach with only water for 30 minutes & no Antacid meds, Calcium or Magnesium for 4 hours & avoid Biotin 90 tablet 3   OVER THE COUNTER MEDICATION daily. [facility] brand allergy pill     terbinafine (LAMISIL) 250 MG tablet Take 1 tablet Daily for Nail Fungus 90 tablet 1   Zinc 50 MG CAPS Take by mouth daily.     No current facility-administered medications on file prior to visit.    No Known Allergies  Current Problems (verified) Patient Active Problem List   Diagnosis Date Noted   Osteopenia 05/13/2019   Overweight (BMI 25.0-29.9) 04/18/2015   Hypertension    Hyperlipidemia    Vitamin D deficiency    Hypothyroidism    Abnormal glucose     Screening Tests Immunization History  Administered Date(s) Administered   DT (Pediatric) 04/08/2014   Influenza, High Dose Seasonal PF 04/18/2015, 05/15/2016, 05/22/2017, 06/20/2018, 04/21/2019, 05/02/2020, 04/10/2021   Influenza-Unspecified 04/21/2019   Moderna SARS-COV2 Booster Vaccination 10/24/2020   Moderna Sars-Covid-2 Vaccination 07/27/2019, 08/24/2019,  05/18/2020   Pfizer Covid-19 Vaccine Bivalent Booster 22yrs & up 04/10/2021   Pneumococcal Conjugate-13 05/15/2016   Pneumococcal Polysaccharide-23 11/18/2006, 04/08/2014   Td 07/16/2002   Tdap 05/02/2020   Zoster Recombinat (Shingrix) 04/19/2021, 08/03/2021   Zoster, Live 01/29/2007   Preventative care: Last colonoscopy: 12/2006  Last cologuard: 07/2020 neg Last mammogram: 08/26/2020 Last pap smear/pelvic exam: 2011, will not have another DEXA: 05/18/21 osteopenia  Prior vaccinations: TD or Tdap: 2015  Influenza: 04/2020 Pneumococcal: 2015 Prevnar13: 2017 Shingles/Zostavax: 2008 Covid 19: 4/4, moderna, 2021  Names of Other Physician/Practitioners you currently use: 1. [facility] here for primary care 2. [facility], Dr. Cathey Endow, eye doctor, 04/2021 3. [facility], Dr. Burnett Sheng, dentist, last visit 06/2021 4. Dr. Yetta Barre, derm, 05/2021  Patient Care Team: Lucky Cowboy, MD as PCP - General (Internal Medicine) Louis Meckel, MD (Inactive) as Consulting Physician (Gastroenterology) Janet Berlin, MD as Consulting Physician (Ophthalmology) Venancio Poisson, MD as Consulting Physician (Dermatology) Janalyn Harder, MD (Inactive) as Consulting Physician (Dermatology)  SURGICAL HISTORY She  has a past surgical history that includes Appendectomy; Tonsillectomy; and Other surgical history (1981). FAMILY HISTORY Her family history includes Asthma in her mother; COPD in her father; Cancer in her brother; Gout in her father; Heart disease in her father and mother. SOCIAL HISTORY She  reports that she has never smoked. She has never used smokeless tobacco. She reports current alcohol use. She reports that she does not use drugs.   MEDICARE WELLNESS OBJECTIVES: Physical activity:   Cardiac risk factors:   Depression/mood screen:      08/01/2022    2:07 AM  Depression screen PHQ 2/9  Decreased Interest 0  Down, Depressed,  Hopeless 0  PHQ - 2 Score 0    ADLs:     08/01/2022    2:07 AM  In your present state of health, do you have any difficulty performing the following activities:  Hearing? 0  Vision? 0  Difficulty concentrating or making decisions? 0  Walking or climbing stairs? 0  Dressing or bathing? 0  Doing errands, shopping? 0     Cognitive Testing  Alert? Yes  Normal Appearance?Yes  Oriented to person? Yes  Place? Yes   Time? Yes  Recall of three objects?  Yes  Can perform simple calculations? Yes  Displays appropriate judgment?Yes  Can read the correct time from a watch face?Yes  EOL planning:    Review of Systems  Constitutional:  Negative for malaise/fatigue and weight loss.  HENT:  Negative for hearing loss and tinnitus.   Eyes:  Negative for blurred vision and double vision.  Respiratory:  Negative for cough, shortness of breath and wheezing.   Cardiovascular:  Negative for chest pain, palpitations, orthopnea, claudication and leg swelling.  Gastrointestinal:  Negative for abdominal pain, blood in stool, constipation, diarrhea, heartburn, melena, nausea and vomiting.  Genitourinary: Negative.   Musculoskeletal:  Negative for joint pain and myalgias.       Occ leg cramps  Skin:  Negative for rash.       Toenail fungus  Neurological:  Negative for dizziness, tingling, sensory change, weakness and headaches.  Endo/Heme/Allergies:  Negative for environmental allergies and polydipsia.  Psychiatric/Behavioral: Negative.  Negative for depression. The patient is not nervous/anxious and does not have insomnia.   All other systems reviewed and are negative.    Objective:     There were no vitals filed for this visit.  There is no height or weight on file to calculate BMI.  General appearance: alert, no distress, WD/WN, female HEENT: normocephalic, sclerae anicteric, TMs pearly, nares patent, no discharge or erythema, pharynx normal Oral cavity: MMM, no lesions Neck: supple, no  lymphadenopathy, no thyromegaly, no masses Heart: RRR, normal S1, S2, no murmurs Lungs: CTA bilaterally, no wheezes, rhonchi, or rales Abdomen: +bs, soft, non tender, non distended, no masses, no hepatomegaly, no splenomegaly Musculoskeletal: nontender, no swelling; she does bony arthritic changes to bil hand DIP joints with mild deviation deformity.  Extremities: no edema, no cyanosis, no clubbing Pulses: 2+ symmetric, upper and lower extremities, normal cap refill Neurological: alert, oriented x 3, CN2-12 intact, strength normal upper extremities and lower extremities, sensation normal throughout, DTRs 2+ throughout, no cerebellar signs, gait normal Psychiatric: normal affect, behavior normal, pleasant   Medicare Attestation I have personally reviewed: The patient's medical and social history Their use of alcohol, tobacco or illicit drugs Their current medications and supplements The patient's functional ability including ADLs,fall risks, home safety risks, cognitive, and hearing and visual impairment Diet and physical activities Evidence  for depression or mood disorders  The patient's weight, height, BMI, and visual acuity have been recorded in the chart.  I have made referrals, counseling, and provided education to the patient based on review of the above and I have provided the patient with a written personalized care plan for preventive services.     Raynelle Dick, NP   11/23/2022

## 2022-11-26 ENCOUNTER — Encounter: Payer: Self-pay | Admitting: Nurse Practitioner

## 2022-11-26 ENCOUNTER — Ambulatory Visit (INDEPENDENT_AMBULATORY_CARE_PROVIDER_SITE_OTHER): Payer: PPO | Admitting: Nurse Practitioner

## 2022-11-26 VITALS — BP 130/72 | HR 73 | Temp 98.0°F | Ht 62.0 in | Wt 149.8 lb

## 2022-11-26 DIAGNOSIS — Z0001 Encounter for general adult medical examination with abnormal findings: Secondary | ICD-10-CM | POA: Diagnosis not present

## 2022-11-26 DIAGNOSIS — E782 Mixed hyperlipidemia: Secondary | ICD-10-CM | POA: Diagnosis not present

## 2022-11-26 DIAGNOSIS — Z0000 Encounter for general adult medical examination without abnormal findings: Secondary | ICD-10-CM

## 2022-11-26 DIAGNOSIS — I10 Essential (primary) hypertension: Secondary | ICD-10-CM

## 2022-11-26 DIAGNOSIS — Z79899 Other long term (current) drug therapy: Secondary | ICD-10-CM

## 2022-11-26 DIAGNOSIS — R7309 Other abnormal glucose: Secondary | ICD-10-CM | POA: Diagnosis not present

## 2022-11-26 DIAGNOSIS — E663 Overweight: Secondary | ICD-10-CM

## 2022-11-26 DIAGNOSIS — E559 Vitamin D deficiency, unspecified: Secondary | ICD-10-CM | POA: Diagnosis not present

## 2022-11-26 DIAGNOSIS — M85852 Other specified disorders of bone density and structure, left thigh: Secondary | ICD-10-CM | POA: Diagnosis not present

## 2022-11-26 DIAGNOSIS — R6889 Other general symptoms and signs: Secondary | ICD-10-CM | POA: Diagnosis not present

## 2022-11-26 DIAGNOSIS — H35371 Puckering of macula, right eye: Secondary | ICD-10-CM | POA: Diagnosis not present

## 2022-11-26 DIAGNOSIS — E039 Hypothyroidism, unspecified: Secondary | ICD-10-CM | POA: Diagnosis not present

## 2022-11-26 DIAGNOSIS — B351 Tinea unguium: Secondary | ICD-10-CM

## 2022-11-26 MED ORDER — TERBINAFINE HCL 250 MG PO TABS
ORAL_TABLET | ORAL | 2 refills | Status: DC
Start: 2022-11-26 — End: 2023-02-07

## 2022-11-26 NOTE — Patient Instructions (Signed)

## 2022-11-27 LAB — COMPLETE METABOLIC PANEL WITH GFR
AG Ratio: 1.6 (calc) (ref 1.0–2.5)
ALT: 16 U/L (ref 6–29)
AST: 17 U/L (ref 10–35)
Albumin: 4.1 g/dL (ref 3.6–5.1)
Alkaline phosphatase (APISO): 76 U/L (ref 37–153)
BUN: 17 mg/dL (ref 7–25)
CO2: 30 mmol/L (ref 20–32)
Calcium: 9.5 mg/dL (ref 8.6–10.4)
Chloride: 106 mmol/L (ref 98–110)
Creat: 0.67 mg/dL (ref 0.60–0.95)
Globulin: 2.6 g/dL (calc) (ref 1.9–3.7)
Glucose, Bld: 102 mg/dL — ABNORMAL HIGH (ref 65–99)
Potassium: 4.4 mmol/L (ref 3.5–5.3)
Sodium: 142 mmol/L (ref 135–146)
Total Bilirubin: 0.3 mg/dL (ref 0.2–1.2)
Total Protein: 6.7 g/dL (ref 6.1–8.1)
eGFR: 88 mL/min/{1.73_m2} (ref >=60)

## 2022-11-27 LAB — LIPID PANEL
Cholesterol: 168 mg/dL (ref <200)
HDL: 42 mg/dL — ABNORMAL LOW (ref >=50)
LDL Cholesterol (Calc): 93 mg/dL (calc)
Non-HDL Cholesterol (Calc): 126 mg/dL (calc) (ref <130)
Total CHOL/HDL Ratio: 4 (calc) (ref <5.0)
Triglycerides: 236 mg/dL — ABNORMAL HIGH (ref <150)

## 2022-11-27 LAB — CBC WITH DIFFERENTIAL/PLATELET
Absolute Monocytes: 572 cells/uL (ref 200–950)
Basophils Absolute: 79 cells/uL (ref 0–200)
Basophils Relative: 0.9 %
Eosinophils Absolute: 194 cells/uL (ref 15–500)
Eosinophils Relative: 2.2 %
HCT: 40.8 % (ref 35.0–45.0)
Hemoglobin: 13.3 g/dL (ref 11.7–15.5)
Lymphs Abs: 2341 cells/uL (ref 850–3900)
MCH: 27.4 pg (ref 27.0–33.0)
MCHC: 32.6 g/dL (ref 32.0–36.0)
MCV: 84.1 fL (ref 80.0–100.0)
MPV: 10.9 fL (ref 7.5–12.5)
Monocytes Relative: 6.5 %
Neutro Abs: 5614 cells/uL (ref 1500–7800)
Neutrophils Relative %: 63.8 %
Platelets: 293 10*3/uL (ref 140–400)
RBC: 4.85 10*6/uL (ref 3.80–5.10)
RDW: 13.2 % (ref 11.0–15.0)
Total Lymphocyte: 26.6 %
WBC: 8.8 10*3/uL (ref 3.8–10.8)

## 2022-11-27 LAB — TSH: TSH: 4.1 mIU/L (ref 0.40–4.50)

## 2023-01-21 DIAGNOSIS — H349 Unspecified retinal vascular occlusion: Secondary | ICD-10-CM | POA: Diagnosis not present

## 2023-02-07 ENCOUNTER — Ambulatory Visit: Payer: PPO | Admitting: Podiatry

## 2023-02-07 DIAGNOSIS — B351 Tinea unguium: Secondary | ICD-10-CM | POA: Diagnosis not present

## 2023-02-07 DIAGNOSIS — L603 Nail dystrophy: Secondary | ICD-10-CM | POA: Diagnosis not present

## 2023-02-07 MED ORDER — TERBINAFINE HCL 250 MG PO TABS: 250.0000 mg | ORAL_TABLET | Freq: Every day | ORAL | 0 refills | Status: DC

## 2023-02-07 NOTE — Progress Notes (Signed)
Chief Complaint  Patient presents with   Nail Problem    Patient came in today for nail fungus, started a year ago, patient has been taking Lamisil and OTC nail gel,  Seen Derm   HPI: 82 y.o. female presents today noting that she has seen dermatology on a regular basis over the past couple of years for nail fungus treatment.  She has been on cyclic rounds oral terbinafine, where she will be on for 30 days and then off for another 30 days.  She states that the nail does not feel like it has improved at all.  The nail is short today but she states that usually grows upward and gets very thick.  She does not recall if the nail was ever cultured to confirm fungus involvement.  Past Medical History:  Diagnosis Date   Arthritis    Hyperlipidemia    Hypertension    Prediabetes    Thyroid disease    Vitamin D deficiency     Past Surgical History:  Procedure Laterality Date   APPENDECTOMY     OTHER SURGICAL HISTORY  1981   BTL    TONSILLECTOMY      No Known Allergies   Physical Exam: There were no vitals filed for this visit.  General: The patient is alert and oriented x3 in no acute distress.  Dermatology: Skin is warm, dry and supple bilateral lower extremities. Interspaces are clear of maceration and debris.  Right third toenail is dystrophic with thickness of 3 mm, subungual debris, distal onycholysis, and pain with compression.  Vascular: Palpable pedal pulses bilaterally. Capillary refill within normal limits.  No appreciable edema.  No erythema or calor.  Neurological: Light touch sensation grossly intact bilateral feet.   Reviewed labs obtained on 11/26/2022: AST and ALT were within normal limits.  Assessment/Plan of Care: 1. Nail dystrophy   2. Dermatophytosis of nail      Meds ordered this encounter  Medications   terbinafine (LAMISIL) 250 MG tablet    Sig: Take 1 tablet (250 mg total) by mouth daily.    Dispense:  90 tablet    Refill:  0   Discussed  clinical findings with patient today.  Patient states that her recent LFTs were within normal limits.  She was given a 90-day course of the oral terbinafine.  She may take it 30 days on 3 days off as she has been via dermatology.  As she cannot take full course if she prefers.  However if she does complete the 90-day course continuously she will need to take a rest to give the liver a break after that 90-day treatment.  Patient consented to have the trimmings of the right third toenail sent to laboratory for fungal culture and confirmation organism involvement.  Also discussed laser treatment which I feel which could be beneficial for this patient.  She is open to this idea as well.  Will contact her once we receive the fungal culture results but will also keep in mind that we may recommend laser treatment at our office.   Clerance Lav, DPM, FACFAS [facility]     [address], [place] [address]  Office [phone]  Fax [phone]

## 2023-02-18 ENCOUNTER — Ambulatory Visit: Payer: PPO | Admitting: Internal Medicine

## 2023-02-25 DIAGNOSIS — Z85828 Personal history of other malignant neoplasm of skin: Secondary | ICD-10-CM | POA: Diagnosis not present

## 2023-02-25 DIAGNOSIS — L603 Nail dystrophy: Secondary | ICD-10-CM | POA: Diagnosis not present

## 2023-02-25 DIAGNOSIS — D2271 Melanocytic nevi of right lower limb, including hip: Secondary | ICD-10-CM | POA: Diagnosis not present

## 2023-02-25 DIAGNOSIS — D1801 Hemangioma of skin and subcutaneous tissue: Secondary | ICD-10-CM | POA: Diagnosis not present

## 2023-02-25 DIAGNOSIS — L821 Other seborrheic keratosis: Secondary | ICD-10-CM | POA: Diagnosis not present

## 2023-02-25 DIAGNOSIS — L820 Inflamed seborrheic keratosis: Secondary | ICD-10-CM | POA: Diagnosis not present

## 2023-02-25 DIAGNOSIS — B351 Tinea unguium: Secondary | ICD-10-CM | POA: Diagnosis not present

## 2023-02-27 ENCOUNTER — Encounter: Payer: Self-pay | Admitting: Internal Medicine

## 2023-02-27 NOTE — Patient Instructions (Signed)

## 2023-02-27 NOTE — Progress Notes (Addendum)
Future Appointments  Date Time Provider Department  02/28/2023                       6 mo ov  9:30 AM Lucky Cowboy, MD [facility]  06/06/2023                      9 mo ov  9:30 AM Raynelle Dick, NP [facility]  09/12/2023                       cpe  3:00 PM Lucky Cowboy, MD [facility]  12/10/2023                       wellness  9:30 AM Raynelle Dick, NP [facility]    History of Present Illness:       This very nice 82 y.o.  MWF presents for 6 month follow up with HTN, HLD, Pre-Diabetes and Vitamin D Deficiency.        Patient is followed expectantly for labile  HTN  circa 2008  &  BP has been controlled at home. Today's BP is at goal -  138/76. Patient has had no complaints of any cardiac type chest pain, palpitations, dyspnea / orthopnea / PND, dizziness, claudication, or dependent edema.        Hyperlipidemia is controlled with diet & meds. Patient denies myalgias or other med SE's. Last Lipids were at goal except elevated Trig's :  Lab Results  Component Value Date   CHOL 168 11/26/2022   HDL 42 (L) 11/26/2022   LDLCALC 93 11/26/2022   TRIG 236 (H) 11/26/2022   CHOLHDL 4.0 11/26/2022     Also, the patient has history of PreDiabetes (A1c 6.2% /2011)  and has had no symptoms of reactive hypoglycemia, diabetic polys, paresthesias or visual blurring.  Last A1c was not at goal :  Lab Results  Component Value Date   HGBA1C 6.0 (H) 08/01/2022               Patient was dx'd Hypothyroid in 2014 &  was initiated on thyroid replacement.  Currently takes Levothyroxine 50 mcg x 1/2 tab = 25 mcg / day .                                                      Further, the patient also has history of Vitamin D Deficiency ("13" /2008) and supplements vitamin D . Last vitamin D was at goal :  Lab Results  Component Value Date   VD25OH 90 08/01/2022     Current Outpatient Medications on File Prior to Visit  Medication Sig   aspirin 81 MG tablet daily.   VITAMIN D1   0,000 Units Take   daily.   Krill Oil 1000 MG CAPS Take daily.    levothyroxine  50 MCG tablet Take 1/2 to 1  tablet daily as directed    [facility] brand allergy pill daily.    terbinafine (250 MG tablet Take 1 tablet daily.   Zinc 50 MG CAPS Take daily.     No Known Allergies    PMHx:   Past Medical History:  Diagnosis Date   Arthritis    Hyperlipidemia    Hypertension    Prediabetes  Thyroid disease    Vitamin D deficiency      Immunization History  Administered Date(s) Administered   DT  04/08/2014   Influenza, High Dose  0/12/2018, 05/02/2020, 04/10/2021   Influenza 04/21/2019   Moderna SARS-COV2 Vacc 10/24/2020   Moderna Sars-Covid-2 Vacc 07/27/2019, 08/24/2019, 05/18/2020   Pfizer Covid-19 Vaccine Bivalent  04/10/2021   Pneumococcal -13 05/15/2016   Pneumococcal 23 11/18/2006, 04/08/2014   Td 07/16/2002   Tdap 05/02/2020   Zoster Recombinant(Shingrix) 04/19/2021, 08/03/2021   Zoster, Live 01/29/2007     Past Surgical History:  Procedure Laterality Date   APPENDECTOMY     OTHER SURGICAL HISTORY  1981   BTL    TONSILLECTOMY       FHx:    Reviewed / unchanged   SHx:    Reviewed / unchanged    Systems Review:  Constitutional: Denies fever, chills, wt changes, headaches, insomnia, fatigue, night sweats, change in appetite. Eyes: Denies redness, blurred vision, diplopia, discharge, itchy, watery eyes.  ENT: Denies discharge, congestion, post nasal drip, epistaxis, sore throat, earache, hearing loss, dental pain, tinnitus, vertigo, sinus pain, snoring.  CV: Denies chest pain, palpitations, irregular heartbeat, syncope, dyspnea, diaphoresis, orthopnea, PND, claudication or edema. Respiratory: denies cough, dyspnea, DOE, pleurisy, hoarseness, laryngitis, wheezing.  Gastrointestinal: Denies dysphagia, odynophagia, heartburn, reflux, water brash, abdominal pain or cramps, nausea, vomiting, bloating, diarrhea, constipation, hematemesis, melena, hematochezia  or  hemorrhoids. Genitourinary: Denies dysuria, frequency, urgency, nocturia, hesitancy, discharge, hematuria or flank pain. Musculoskeletal: Denies arthralgias, myalgias, stiffness, jt. swelling, pain, limping or strain/sprain.  Skin: Denies pruritus, rash, hives, warts, acne, eczema or change in skin lesion(s). Neuro: No weakness, tremor, incoordination, spasms, paresthesia or pain. Psychiatric: Denies confusion, memory loss or sensory loss. Endo: Denies change in weight, skin or hair change.  Heme/Lymph: No excessive bleeding, bruising or enlarged lymph nodes.   Physical Exam  BP 138/76   Pulse 62   Temp 97.9 F (36.6 C)   Resp 16   Ht 5\' 2"  (1.575 m)   Wt 148 lb 3.2 oz (67.2 kg)   SpO2 98%   BMI 27.11 kg/m   Appears  well nourished, well groomed  and in no distress.  Eyes: PERRLA, EOMs, conjunctiva no swelling or erythema. Sinuses: No frontal/maxillary tenderness ENT/Mouth: EAC's clear, TM's nl w/o erythema, bulging. Nares clear w/o erythema, swelling, exudates. Oropharynx clear without erythema or exudates. Oral hygiene is good. Tongue normal, non obstructing. Hearing intact.  Neck: Supple. Thyroid not palpable. Car 2+/2+ without bruits, nodes or JVD. Chest: Respirations nl with BS clear & equal w/o rales, rhonchi, wheezing or stridor.  Cor: Heart sounds normal w/ regular rate and rhythm without sig. murmurs, gallops, clicks or rubs. Peripheral pulses normal and equal  without edema.  Abdomen: Soft & bowel sounds normal. Non-tender w/o guarding, rebound, hernias, masses or organomegaly.  Lymphatics: Unremarkable.  Musculoskeletal: Full ROM all peripheral extremities, joint stability, 5/5 strength and normal gait.  Skin: Warm, dry without exposed rashes, lesions or ecchymosis apparent.  Neuro: Cranial nerves intact, reflexes equal bilaterally. Sensory-motor testing grossly intact. Tendon reflexes grossly intact.  Pysch: Alert & oriented x 3.  Insight and judgement nl &  appropriate. No ideations.   Assessment and Plan:   1. Essential hypertension  - Continue medication, monitor blood pressure at home.  - Continue DASH diet.  Reminder to go to the ER if any CP,  SOB, nausea, dizziness, severe HA, changes vision/speech.    - CBC with Differential/Platelet - COMPLETE METABOLIC PANEL WITH  GFR - Magnesium   2. Hyperlipidemia, mixed  - Continue diet/meds, exercise,& lifestyle modifications.  - Continue monitor periodic cholesterol/liver & renal functions    - Lipid panel - TSH   3. Abnormal glucose  - Continue diet, exercise  - Lifestyle modifications.  - Monitor appropriate labs   - Hemoglobin A1c - Insulin, random   4. Vitamin D deficiency  - Continue supplementation   - VITAMIN D 25 Hydroxy    5. Hypothyroidism  - TSH   6. Medication management  - CBC with Differential/Platelet - COMPLETE METABOLIC PANEL WITH GFR - Magnesium - Lipid panel - TSH - Hemoglobin A1c - Insulin, random - VITAMIN D 25 Hydroxy           Discussed  regular exercise, BP monitoring, weight control to achieve/maintain BMI less than 25 and discussed med and SE's. Recommended labs to assess /monitor clinical status .  I discussed the assessment and treatment plan with the patient. The patient was provided an opportunity to ask questions and all were answered. The patient agreed with the plan and demonstrated an understanding of the instructions.  I provided over 30 minutes of exam, counseling, chart review and  complex critical decision making.        The patient was advised to call back or seek an in-person evaluation if the symptoms worsen or if the condition fails to improve as anticipated.   Marinus Maw, MD

## 2023-02-28 ENCOUNTER — Encounter: Payer: Self-pay | Admitting: Internal Medicine

## 2023-02-28 ENCOUNTER — Ambulatory Visit (INDEPENDENT_AMBULATORY_CARE_PROVIDER_SITE_OTHER): Payer: PPO | Admitting: Internal Medicine

## 2023-02-28 VITALS — BP 138/76 | HR 62 | Temp 97.9°F | Resp 16 | Ht 62.0 in | Wt 148.2 lb

## 2023-02-28 DIAGNOSIS — Z79899 Other long term (current) drug therapy: Secondary | ICD-10-CM | POA: Diagnosis not present

## 2023-02-28 DIAGNOSIS — E559 Vitamin D deficiency, unspecified: Secondary | ICD-10-CM | POA: Diagnosis not present

## 2023-02-28 DIAGNOSIS — E039 Hypothyroidism, unspecified: Secondary | ICD-10-CM

## 2023-02-28 DIAGNOSIS — E782 Mixed hyperlipidemia: Secondary | ICD-10-CM | POA: Diagnosis not present

## 2023-02-28 DIAGNOSIS — I10 Essential (primary) hypertension: Secondary | ICD-10-CM | POA: Diagnosis not present

## 2023-02-28 DIAGNOSIS — R7309 Other abnormal glucose: Secondary | ICD-10-CM

## 2023-03-01 LAB — CBC WITH DIFFERENTIAL/PLATELET
Absolute Monocytes: 792 {cells}/uL (ref 200–950)
Basophils Absolute: 70 {cells}/uL (ref 0–200)
Basophils Relative: 0.8 %
Eosinophils Absolute: 209 {cells}/uL (ref 15–500)
Eosinophils Relative: 2.4 %
HCT: 42.7 % (ref 35.0–45.0)
Hemoglobin: 14 g/dL (ref 11.7–15.5)
Lymphs Abs: 2471 cells/uL (ref 850–3900)
MCH: 27.8 pg (ref 27.0–33.0)
MCHC: 32.8 g/dL (ref 32.0–36.0)
MCV: 84.9 fL (ref 80.0–100.0)
MPV: 10.6 fL (ref 7.5–12.5)
Monocytes Relative: 9.1 %
Neutro Abs: 5159 {cells}/uL (ref 1500–7800)
Neutrophils Relative %: 59.3 %
Platelets: 297 10*3/uL (ref 140–400)
RBC: 5.03 10*6/uL (ref 3.80–5.10)
RDW: 13.4 % (ref 11.0–15.0)
Total Lymphocyte: 28.4 %
WBC: 8.7 10*3/uL (ref 3.8–10.8)

## 2023-03-01 LAB — COMPLETE METABOLIC PANEL WITH GFR
AG Ratio: 1.7 (calc) (ref 1.0–2.5)
ALT: 16 U/L (ref 6–29)
AST: 17 U/L (ref 10–35)
Albumin: 4.3 g/dL (ref 3.6–5.1)
Alkaline phosphatase (APISO): 81 U/L (ref 37–153)
BUN: 16 mg/dL (ref 7–25)
CO2: 29 mmol/L (ref 20–32)
Calcium: 9.8 mg/dL (ref 8.6–10.4)
Chloride: 107 mmol/L (ref 98–110)
Creat: 0.67 mg/dL (ref 0.60–0.95)
Globulin: 2.5 g/dL (ref 1.9–3.7)
Glucose, Bld: 85 mg/dL (ref 65–99)
Potassium: 4.4 mmol/L (ref 3.5–5.3)
Sodium: 142 mmol/L (ref 135–146)
Total Bilirubin: 0.4 mg/dL (ref 0.2–1.2)
Total Protein: 6.8 g/dL (ref 6.1–8.1)
eGFR: 88 mL/min/{1.73_m2} (ref >=60)

## 2023-03-01 LAB — VITAMIN D 25 HYDROXY (VIT D DEFICIENCY, FRACTURES): Vit D, 25-Hydroxy: 105 ng/mL — ABNORMAL HIGH (ref 30–100)

## 2023-03-01 LAB — LIPID PANEL
Cholesterol: 169 mg/dL (ref <200)
HDL: 45 mg/dL — ABNORMAL LOW (ref >=50)
LDL Cholesterol (Calc): 97 mg/dL
Non-HDL Cholesterol (Calc): 124 mg/dL (ref <130)
Total CHOL/HDL Ratio: 3.8 (calc) (ref <5.0)
Triglycerides: 178 mg/dL — ABNORMAL HIGH (ref <150)

## 2023-03-01 LAB — HEMOGLOBIN A1C
Hgb A1c MFr Bld: 6 %{Hb} — ABNORMAL HIGH (ref <5.7)
Mean Plasma Glucose: 126 mg/dL
eAG (mmol/L): 7 mmol/L

## 2023-03-01 LAB — INSULIN, RANDOM: Insulin: 14.7 u[IU]/mL

## 2023-03-01 LAB — MAGNESIUM: Magnesium: 2.1 mg/dL (ref 1.5–2.5)

## 2023-03-01 LAB — TSH: TSH: 4.81 m[IU]/L — ABNORMAL HIGH (ref 0.40–4.50)

## 2023-03-01 NOTE — Progress Notes (Signed)
^<^<^<^<^<^<^<^<^<^<^<^<^<^<^<^<^<^<^<^<^<^<^<^<^<^<^<^<^<^<^<^<^<^<^<^<^ ^>^>^>^>^>^>^>^>^>^>^>>^>^>^>^>^>^>^>^>^>^>^>^>^>^>^>^>^>^>^>^>^>^>^>^>^>  -Test results slightly outside the reference range are not unusual. If there is anything important, I will review this with you,  otherwise it is considered normal test values.  If you have further questions,  please do not hesitate to contact me at the office or via My Chart.   ^<^<^<^<^<^<^<^<^<^<^<^<^<^<^<^<^<^<^<^<^<^<^<^<^<^<^<^<^<^<^<^<^<^<^<^<^ ^>^>^>^>^>^>^>^>^>^>^>^>^>^>^>^>^>^>^>^>^>^>^>^>^>^>^>^>^>^>^>^>^>^>^>^>^  -   Thyroid slightly low again              Currently on 1/2 tab of levothyroxine  ( = 25 mcg) / day  - Recommend increase to 1 whole tablet    4 x / week    on     Tues Thurs Sat Sun &                                            1/2    tablet        3 x / week    on     Mon Wed  Fri    ^<^<^<^<^<^<^<^<^<^<^<^<^<^<^<^<^<^<^<^<^<^<^<^<^<^<^<^<^<^<^<^<^<^<^<^<^ ^>^>^>^>^>^>^>^>^>^>^>^>^>^>^>^>^>^>^>^>^>^>^>^>^>^>^>^>^>^>^>^>^>^>^>^>^  -  A1c = 6.0%  = 12 week average blood sugar     is      still elevated     -  It is very important that you work harder with diet by                                     avoiding all foods that are white except chicken, fish & calliflower.  - Avoid white rice  (brown & wild rice is OK),   - Avoid white potatoes  (sweet potatoes in moderation is OK),   White bread or wheat bread or anything made out of                                             white flour like bagels, donuts, rolls, buns, biscuits, cakes,  - pastries, cookies, pizza crust, and pasta (made from white flour & egg whites)   - vegetarian pasta or spinach or wheat pasta is OK.  - Multigrain breads like Arnold's, Pepperidge Farm or                                                     multigrain sandwich thins or high fiber breads like                                                       Eureka bread or "Dave's  Killer" breads that are                                                       4 to 5 grams fiber per slice !  are best.  Diet, exercise and weight loss can reverse and cure diabetes in the early stages.    ^<^<^<^<^<^<^<^<^<^<^<^<^<^<^<^<^<^<^<^<^<^<^<^<^<^<^<^<^<^<^<^<^<^<^<^<^ ^>^>^>^>^>^>^>^>^>^>^>^>^>^>^>^>^>^>^>^>^>^>^>^>^>^>^>^>^>^>^>^>^>^>^>^>^  -  Vitamin D = 105 - is borderline - But recommend keep same dose for now   ^<^<^<^<^<^<^<^<^<^<^<^<^<^<^<^<^<^<^<^<^<^<^<^<^<^<^<^<^<^<^<^<^<^<^<^<^ ^>^>^>^>^>^>^>^>^>^>^>^>^>^>^>^>^>^>^>^>^>^>^>^>^>^>^>^>^>^>^>^>^>^>^>^>^  -  All Else - CBC - Kidneys - Electrolytes - Liver - Magnesium & Thyroid    - all  Normal / OK  ^<^<^<^<^<^<^<^<^<^<^<^<^<^<^<^<^<^<^<^<^<^<^<^<^<^<^<^<^<^<^<^<^<^<^<^<^ ^>^>^>^>^>^>^>^>^>^>^>^>^>^>^>^>^>^>^>^>^>^>^>^>^>^>^>^>^>^>^>^>^>^>^>^>^

## 2023-04-15 ENCOUNTER — Other Ambulatory Visit: Payer: Self-pay | Admitting: Internal Medicine

## 2023-04-16 ENCOUNTER — Ambulatory Visit (INDEPENDENT_AMBULATORY_CARE_PROVIDER_SITE_OTHER): Payer: PPO | Admitting: Nurse Practitioner

## 2023-04-16 ENCOUNTER — Encounter: Payer: Self-pay | Admitting: Nurse Practitioner

## 2023-04-16 DIAGNOSIS — R051 Acute cough: Secondary | ICD-10-CM

## 2023-04-16 DIAGNOSIS — U071 COVID-19: Secondary | ICD-10-CM

## 2023-04-16 MED ORDER — PROMETHAZINE-DM 6.25-15 MG/5ML PO SYRP
2.5000 mL | ORAL_SOLUTION | Freq: Four times a day (QID) | ORAL | 0 refills | Status: DC | PRN
Start: 2023-04-16 — End: 2023-06-06

## 2023-04-16 NOTE — Progress Notes (Signed)
THIS ENCOUNTER IS A VIRTUAL VISIT DUE TO COVID-19 - PATIENT WAS NOT SEEN IN THE OFFICE.  PATIENT HAS CONSENTED TO VIRTUAL VISIT / TELEMEDICINE VISIT   Virtual Visit via telephone Note  I connected with  [place] on 04/16/2023 by telephone.  I verified that I am speaking with the correct person using two identifiers.    I discussed the limitations of evaluation and management by telemedicine and the availability of in person appointments. The patient expressed understanding and agreed to proceed.  History of Present Illness:  There were no vitals taken for this visit. 82 y.o. patient contacted office reporting URI sx congestion, cough, sore throat, fatigue. she tested positive by in home test. OV was conducted by telephone to minimize exposure. This patient has been vaccinated for covid 19, last 04/2021  Sx began 3 days ago with cough, fatigue  Treatments tried so far: Delsym  Exposures: Pension scheme manager - [employer]    Medications  Current Outpatient Medications (Endocrine & Metabolic):    levothyroxine (SYNTHROID) 50 MCG tablet, Take 1/2 to 1 tablet daily as directed on an empty stomach with only water for 30 minutes & no Antacid meds, Calcium or Magnesium for 4 hours & avoid Biotin    Take 1/2 to 1  tablet daily as directed on an empty stomach with only water for 30 minutes & no Antacid meds, Calcium or Magnesium for 4 hours & avoid Biotin    Current Outpatient Medications (Analgesics):    aspirin 81 MG chewable tablet, Chew by mouth daily.   Current Outpatient Medications (Other):    Cholecalciferol (VITAMIN D PO), Take 10,000 Int'l Units by mouth daily.   Krill Oil 1000 MG CAPS, Take by mouth daily.    OVER THE COUNTER MEDICATION, daily. [facility] brand allergy pill   terbinafine (LAMISIL) 250 MG tablet, Take 1 tablet (250 mg total) by mouth daily.   Zinc 50 MG CAPS, Take by mouth daily.  Allergies: No Known Allergies  Problem list She has Hypertension;  Hyperlipidemia; Vitamin D deficiency; Hypothyroidism; Abnormal glucose; Overweight (BMI 25.0-29.9); and Osteopenia on their problem list.   Social History:   reports that she has never smoked. She has never used smokeless tobacco. She reports current alcohol use. She reports that she does not use drugs.  Observations/Objective:  General : Well sounding patient in no apparent distress HEENT: no hoarseness, no cough for duration of visit Lungs: speaks in complete sentences, no audible wheezing, no apparent distress Neurological: alert, oriented x 3 Psychiatric: pleasant, judgement appropriate   Assessment and Plan:  Covid 19 Covid 19 positive per rapid screening test in home Risk factors include: Hypertension; Hyperlipidemia; Vitamin D deficiency; Hypothyroidism; Abnormal glucose; Overweight (BMI 25.0-29.9); and Osteopenia on their problem list.   Symptoms are: mild Due to co morbid conditions and risk factors, discussed antivirals  Immue support reviewed Vitamin C, Vitamin D, Zinc Take tylenol PRN temp 101+ Push hydration Regular ambulation or calf exercises exercises for clot prevention and 81 mg ASA unless contraindicated Sx supportive therapy suggested Follow up via mychart or telephone if needed Advised patient obtain O2 monitor; present to ED if persistently <90% or with severe dyspnea, CP, fever uncontrolled by tylenol, confusion, sudden decline       Should remain in isolation 5 days from testing positive and then wear a mask when around other people for the following 5 days  COVID-19 Positive  Acute cough  - promethazine-dextromethorphan (PROMETHAZINE-DM) 6.25-15 MG/5ML syrup; Take 2.5 mLs by mouth 4 (  four) times daily as needed for cough.  Dispense: 240 mL; Refill: 0   Follow Up Instructions:  I discussed the assessment and treatment plan with the patient. The patient was provided an opportunity to ask questions and all were answered. The patient agreed with the plan  and demonstrated an understanding of the instructions.   The patient was advised to call back or seek an in-person evaluation if the symptoms worsen or if the condition fails to improve as anticipated.  I provided 15 minutes of non-face-to-face time during this encounter.   Adela Glimpse, NP

## 2023-05-20 DIAGNOSIS — H349 Unspecified retinal vascular occlusion: Secondary | ICD-10-CM | POA: Diagnosis not present

## 2023-05-22 ENCOUNTER — Ambulatory Visit: Payer: PPO | Admitting: Nurse Practitioner

## 2023-06-05 NOTE — Progress Notes (Unsigned)
3 MONTH FOLLOW UP  Assessment:    Essential hypertension Labile; currently managed without medications - will continue to monitor and initiate treatment for persistently elevated BPs as indicated Monitor blood pressure at home; call if consistently over 130/80 Continue DASH diet.   Reminder to go to the ER if any CP, SOB, nausea, dizziness, severe HA, changes vision/speech, left arm numbness and tingling and jaw pain.  Hypothyroidism, unspecified type continue medications the same reminded to take on an empty stomach 30-43mins before food.  -     TSH  Mixed hyperlipidemia At goal; continue medications krill oil supplement Continue low cholesterol diet and exercise.  -Lipid panel  Vitamin D deficiency Continue supplementation Defer checking vitamin D level  Abnormal GLucose Discussed disease and risks Discussed diet/exercise, weight management  -  CMP  Puckering of Macula, right Continue to follow with Dr. Cathey Endow  Medication management -     CBC with Differential/Platelet -     CMP/GFR  Overweight  Long discussion about weight loss, diet, and exercise Recommended diet heavy in fruits and veggies and low in animal meats, cheeses, and dairy products, appropriate calorie intake Follow up in 6 months   Over 40 minutes of exam, counseling, chart review and critical decision making was performed Future Appointments  Date Time Provider Department Center  06/06/2023  9:30 AM Raynelle Dick, NP [facility] None  09/12/2023  3:00 PM Lucky Cowboy, MD [facility] None  12/10/2023  9:30 AM Raynelle Dick, NP [facility] None     Plan:   During the course of the visit the patient was educated and counseled about appropriate screening and preventive services including:   Pneumococcal vaccine  Prevnar 13 Influenza vaccine Td vaccine Screening electrocardiogram Bone densitometry screening Colorectal cancer screening Diabetes screening Glaucoma screening Nutrition  counseling  Advanced directives: requested   Subjective:  Anna Caldwell is a remarkably healthy 82 y.o. female who presents for Medicare Annual Wellness Visit and 3 month follow up.   She has more stress related to husband having a heart attack but he is now doing well.   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   she has a diagnosis of GERD which is currently managed by lifestyle modification she reports symptoms is currently well controlled, and denies breakthrough reflux, burning in chest, hoarseness or cough.    She is currently using Lamisil for toenail fungus of right middle toe.  No issues. Has been taking 30 days on and 30 days off. Has not seen much success.   BMI is There is no height or weight on file to calculate BMI., she has been working on diet and exercise, will walk some on her property but not consistently, does a lot of garden.  Wt Readings from Last 3 Encounters:  02/28/23 148 lb 3.2 oz (67.2 kg)  11/26/22 149 lb 12.8 oz (67.9 kg)  08/01/22 149 lb 9.6 oz (67.9 kg)    She does have BP cuff, hasn't been checking, today their BP is    BP Readings from Last 3 Encounters:  02/28/23 138/76  11/26/22 130/72  08/01/22 138/80  She does workout. She denies chest pain, shortness of breath, dizziness.    She is not on cholesterol medication and denies myalgias. Her cholesterol is not at goal. The cholesterol last visit was:   Lab Results  Component Value Date   CHOL 169 02/28/2023   HDL 45 (L) 02/28/2023   LDLCALC 97 02/28/2023   TRIG 178 (H) 02/28/2023  CHOLHDL 3.8 02/28/2023    She has been working on diet and exercise for prediabetes, and denies increased appetite, nausea, paresthesia of the feet, polydipsia, polyuria, visual disturbances and vomiting. Last A1C in the office was back within normal range:  Lab Results  Component Value Date   HGBA1C 6.0 (H) 02/28/2023   Last GFR: Lab Results  Component Value Date   EGFR 88 02/28/2023    She is on  thyroid medication. Her medication was not changed last visit. She reports taking 1/2 tab of 50 mcg daily.  Lab Results  Component Value Date   TSH 4.81 (H) 02/28/2023   Patient is on Vitamin D supplement and at goal at the last visit:    Lab Results  Component Value Date   VD25OH 105 (H) 02/28/2023      Medication Review: Current Outpatient Medications on File Prior to Visit  Medication Sig Dispense Refill   aspirin 81 MG chewable tablet Chew by mouth daily.     Cholecalciferol (VITAMIN D PO) Take 10,000 Int'l Units by mouth daily.     Krill Oil 1000 MG CAPS Take by mouth daily.      levothyroxine (SYNTHROID) 50 MCG tablet Take 1/2 to 1 tablet daily as directed on an empty stomach with only water for 30 minutes & no Antacid meds, Calcium or Magnesium for 4 hours & avoid Biotin    Take 1/2 to 1  tablet daily as directed on an empty stomach with only water for 30 minutes & no Antacid meds, Calcium or Magnesium for 4 hours & avoid Biotin 90 tablet 3   OVER THE COUNTER MEDICATION daily. [facility] brand allergy pill     promethazine-dextromethorphan (PROMETHAZINE-DM) 6.25-15 MG/5ML syrup Take 2.5 mLs by mouth 4 (four) times daily as needed for cough. 240 mL 0   terbinafine (LAMISIL) 250 MG tablet Take 1 tablet (250 mg total) by mouth daily. 90 tablet 0   Zinc 50 MG CAPS Take by mouth daily.     No current facility-administered medications on file prior to visit.    No Known Allergies  Current Problems (verified) Patient Active Problem List   Diagnosis Date Noted   Osteopenia 05/13/2019   Overweight (BMI 25.0-29.9) 04/18/2015   Hypertension    Hyperlipidemia    Vitamin D deficiency    Hypothyroidism    Abnormal glucose     Screening Tests Immunization History  Administered Date(s) Administered   DT (Pediatric) 04/08/2014   Influenza, High Dose Seasonal PF 04/18/2015, 05/15/2016, 05/22/2017, 06/20/2018, 04/21/2019, 05/02/2020, 04/10/2021, 05/02/2023   Influenza-Unspecified 04/21/2019    Moderna SARS-COV2 Booster Vaccination 10/24/2020   Moderna Sars-Covid-2 Vaccination 07/27/2019, 08/24/2019, 05/18/2020   Pfizer Covid-19 Vaccine Bivalent Booster 55yrs & up 04/10/2021   Pneumococcal Conjugate-13 05/15/2016   Pneumococcal Polysaccharide-23 11/18/2006, 04/08/2014   Rsv, Bivalent, Protein Subunit Rsvpref,pf Verdis Frederickson) 07/12/2022   Td 07/16/2002   Tdap 05/02/2020   Unspecified SARS-COV-2 Vaccination 04/25/2022   Zoster Recombinant(Shingrix) 04/19/2021, 08/03/2021   Zoster, Live 01/29/2007   Health Maintenance  Topic Date Due   COVID-19 Vaccine (6 - 2023-24 season) 03/17/2023   Medicare Annual Wellness (AWV)  11/26/2023   DTaP/Tdap/Td (4 - Td or Tdap) 05/02/2030   Pneumonia Vaccine 91+ Years old  Completed   INFLUENZA VACCINE  Completed   DEXA SCAN  Completed   Zoster Vaccines- Shingrix  Completed   HPV VACCINES  Aged Out     Names of Other Physician/Practitioners you currently use: 1. [facility] here  for primary care 2. [facility], Dr. Cathey Endow, eye doctor, 11/22/22 3. [facility], Dr. Burnett Sheng, dentist, last visit 06/2022 4. Dr. Yetta Barre, derm, 02/05/22  Patient Care Team: Lucky Cowboy, MD as PCP - General (Internal Medicine) Louis Meckel, MD (Inactive) as Consulting Physician (Gastroenterology) Janet Berlin, MD as Consulting Physician (Ophthalmology) Venancio Poisson, MD as Consulting Physician (Dermatology) Janalyn Harder, MD (Inactive) as Consulting Physician (Dermatology)  SURGICAL HISTORY She  has a past surgical history that includes Appendectomy; Tonsillectomy; and Other surgical history (1981). FAMILY HISTORY Her family history includes Asthma in her mother; COPD in her father; Cancer in her brother; Gout in her father; Heart disease in her father and mother. SOCIAL HISTORY She  reports that she has never smoked. She has never used smokeless tobacco. She reports current alcohol use. She reports that  she does not use drugs.   MEDICARE WELLNESS OBJECTIVES: Physical activity:   Cardiac risk factors:   Depression/mood screen:      02/27/2023   10:30 PM  Depression screen PHQ 2/9  Decreased Interest 0  Down, Depressed, Hopeless 0  PHQ - 2 Score 0    ADLs:     02/27/2023   10:30 PM 11/26/2022    2:16 PM  In your present state of health, do you have any difficulty performing the following activities:  Hearing? 0 0  Vision? 0 0  Difficulty concentrating or making decisions? 0 0  Walking or climbing stairs? 0 0  Dressing or bathing? 0 0  Doing errands, shopping? 0 0     Cognitive Testing  Alert? Yes  Normal Appearance?Yes  Oriented to person? Yes  Place? Yes   Time? Yes  Recall of three objects?  Yes  Can perform simple calculations? Yes  Displays appropriate judgment?Yes  Can read the correct time from a watch face?Yes  EOL planning:    Review of Systems  Constitutional:  Negative for malaise/fatigue and weight loss.  HENT:  Negative for hearing loss and tinnitus.   Eyes:  Negative for blurred vision and double vision.  Respiratory:  Negative for cough, shortness of breath and wheezing.   Cardiovascular:  Negative for chest pain, palpitations, orthopnea, claudication and leg swelling.  Gastrointestinal:  Negative for abdominal pain, blood in stool, constipation, diarrhea, heartburn, melena, nausea and vomiting.  Genitourinary: Negative.   Musculoskeletal:  Negative for joint pain and myalgias.       Occ leg cramps  Skin:  Negative for rash.       Toenail fungus  Neurological:  Negative for dizziness, tingling, sensory change, weakness and headaches.  Endo/Heme/Allergies:  Negative for environmental allergies and polydipsia.  Psychiatric/Behavioral: Negative.  Negative for depression. The patient is not nervous/anxious and does not have insomnia.   All other systems reviewed and are negative.    Objective:     There were no vitals filed for this visit.  There  is no height or weight on file to calculate BMI.  General appearance: alert, no distress, WD/WN, female HEENT: normocephalic, sclerae anicteric, TMs pearly, nares patent, no discharge or erythema, pharynx normal Oral cavity: MMM, no lesions Neck: supple, no lymphadenopathy, no thyromegaly, no masses Heart: RRR, normal S1, S2, no murmurs Lungs: CTA bilaterally, no wheezes, rhonchi, or rales Abdomen: +bs, soft, non tender, non distended, no masses, no hepatomegaly, no splenomegaly Musculoskeletal: nontender, no swelling; she does bony arthritic changes to bil hand DIP joints with mild deviation deformity.  Extremities: no edema, no cyanosis, no clubbing. Onychomycosis of 3rd right toe  Pulses: 2+ symmetric, upper and lower extremities, normal cap refill Neurological: alert, oriented x 3, CN2-12 intact, strength normal upper extremities and lower extremities, sensation normal throughout, DTRs 2+ throughout, no cerebellar signs, gait normal Psychiatric: normal affect, behavior normal, pleasant   Medicare Attestation I have personally reviewed: The patient's medical and social history Their use of alcohol, tobacco or illicit drugs Their current medications and supplements The patient's functional ability including ADLs,fall risks, home safety risks, cognitive, and hearing and visual impairment Diet and physical activities Evidence for depression or mood disorders  The patient's weight, height, BMI, and visual acuity have been recorded in the chart.  I have made referrals, counseling, and provided education to the patient based on review of the above and I have provided the patient with a written personalized care plan for preventive services.     Raynelle Dick, NP   06/05/2023

## 2023-06-06 ENCOUNTER — Encounter: Payer: Self-pay | Admitting: Nurse Practitioner

## 2023-06-06 ENCOUNTER — Ambulatory Visit (INDEPENDENT_AMBULATORY_CARE_PROVIDER_SITE_OTHER): Payer: PPO | Admitting: Nurse Practitioner

## 2023-06-06 ENCOUNTER — Other Ambulatory Visit: Payer: Self-pay

## 2023-06-06 VITALS — BP 138/70 | HR 75 | Temp 97.9°F | Ht 62.0 in | Wt 149.2 lb

## 2023-06-06 DIAGNOSIS — H35371 Puckering of macula, right eye: Secondary | ICD-10-CM

## 2023-06-06 DIAGNOSIS — R7309 Other abnormal glucose: Secondary | ICD-10-CM

## 2023-06-06 DIAGNOSIS — Z79899 Other long term (current) drug therapy: Secondary | ICD-10-CM

## 2023-06-06 DIAGNOSIS — E782 Mixed hyperlipidemia: Secondary | ICD-10-CM | POA: Diagnosis not present

## 2023-06-06 DIAGNOSIS — E559 Vitamin D deficiency, unspecified: Secondary | ICD-10-CM | POA: Diagnosis not present

## 2023-06-06 DIAGNOSIS — L858 Other specified epidermal thickening: Secondary | ICD-10-CM

## 2023-06-06 DIAGNOSIS — E039 Hypothyroidism, unspecified: Secondary | ICD-10-CM

## 2023-06-06 DIAGNOSIS — I10 Essential (primary) hypertension: Secondary | ICD-10-CM | POA: Diagnosis not present

## 2023-06-06 DIAGNOSIS — B078 Other viral warts: Secondary | ICD-10-CM | POA: Diagnosis not present

## 2023-06-06 DIAGNOSIS — E663 Overweight: Secondary | ICD-10-CM | POA: Diagnosis not present

## 2023-06-06 NOTE — Patient Instructions (Addendum)
Skin Biopsy, Care After The following information offers guidance on how to care for yourself after your procedure. Your health care provider may also give you more specific instructions. If you have problems or questions, contact your health care provider. What can I expect after the procedure? After the procedure, it is common to have: Soreness or mild pain. Bruising. Itching. Some redness and swelling. Follow these instructions at home: Biopsy site care  Follow instructions from your health care provider about how to take care of your biopsy site. Make sure you: Wash your hands with soap and water for at least 20 seconds before and after you change your bandage (dressing). If soap and water are not available, use hand sanitizer. Change your dressing as told by your health care provider. Leave stitches (sutures), skin glue, or adhesive strips in place. These skin closures may need to stay in place for 2 weeks or longer. If adhesive strip edges start to loosen and curl up, you may trim the loose edges. Do not remove adhesive strips completely unless your health care provider tells you to do that. Check your biopsy site every day for signs of infection. Check for: More redness, swelling, or pain. Fluid or blood. Warmth. Pus or a bad smell. Do not take baths, swim, or use a hot tub until your health care provider approves. Ask your health care provider if you may take showers. You may only be allowed to take sponge baths. General instructions Take over-the-counter and prescription medicines only as told by your health care provider. Return to your normal activities as told by your health care provider. Ask your health care provider what activities are safe for you. Keep all follow-up visits. This is important. Contact a health care provider if: You have more redness, swelling, or pain around your biopsy site. You have fluid or blood coming from your biopsy site. Your biopsy site feels  warm to the touch. You have pus or a bad smell coming from your biopsy site. You have a fever. Your sutures, skin glue, or adhesive strips loosen or come off sooner than expected. Get help right away if: You have bleeding that does not stop with pressure or a dressing. Summary After the procedure, it is common to have soreness, bruising, and itching at the site. Follow instructions from your health care provider about how to take care of your biopsy site. Check your biopsy site every day for signs of infection. Contact a health care provider if you have more redness, swelling, or pain around your biopsy site, or your biopsy site feels warm to the touch. Keep all follow-up visits. This is important. This information is not intended to replace advice given to you by your health care provider. Make sure you discuss any questions you have with your health care provider. Document Revised: 01/31/2021 Document Reviewed: 01/31/2021 Elsevier Patient Education  2024 [employer]  GENERAL HEALTH GOALS   Know what a healthy weight is for you (roughly BMI <25) and aim to maintain this   Aim for 7+ servings of fruits and vegetables daily   70-80+ fluid ounces of water or unsweet tea for healthy kidneys   Limit to max 1 drink of alcohol per day; avoid smoking/tobacco   Limit animal fats in diet for cholesterol and heart health - choose grass fed whenever available   Avoid highly processed foods, and foods high in saturated/trans fats   Aim for low stress - take time to unwind and care for your mental  health   Aim for 150 min of moderate intensity exercise weekly for heart health, and weights twice weekly for bone health   Aim for 7-9 hours of sleep daily

## 2023-06-07 LAB — CBC WITH DIFFERENTIAL/PLATELET
Absolute Lymphocytes: 2573 {cells}/uL (ref 850–3900)
Absolute Monocytes: 805 {cells}/uL (ref 200–950)
Basophils Absolute: 83 {cells}/uL (ref 0–200)
Basophils Relative: 1 %
Eosinophils Absolute: 208 {cells}/uL (ref 15–500)
Eosinophils Relative: 2.5 %
HCT: 40 % (ref 35.0–45.0)
Hemoglobin: 13 g/dL (ref 11.7–15.5)
MCH: 27.6 pg (ref 27.0–33.0)
MCHC: 32.5 g/dL (ref 32.0–36.0)
MCV: 84.9 fL (ref 80.0–100.0)
MPV: 10.2 fL (ref 7.5–12.5)
Monocytes Relative: 9.7 %
Neutro Abs: 4631 {cells}/uL (ref 1500–7800)
Neutrophils Relative %: 55.8 %
Platelets: 319 10*3/uL (ref 140–400)
RBC: 4.71 10*6/uL (ref 3.80–5.10)
RDW: 13.5 % (ref 11.0–15.0)
Total Lymphocyte: 31 %
WBC: 8.3 10*3/uL (ref 3.8–10.8)

## 2023-06-07 LAB — COMPLETE METABOLIC PANEL WITH GFR
AG Ratio: 1.6 (calc) (ref 1.0–2.5)
ALT: 16 U/L (ref 6–29)
AST: 15 U/L (ref 10–35)
Albumin: 4 g/dL (ref 3.6–5.1)
Alkaline phosphatase (APISO): 89 U/L (ref 37–153)
BUN: 14 mg/dL (ref 7–25)
CO2: 28 mmol/L (ref 20–32)
Calcium: 9.5 mg/dL (ref 8.6–10.4)
Chloride: 106 mmol/L (ref 98–110)
Creat: 0.62 mg/dL (ref 0.60–0.95)
Globulin: 2.5 g/dL (ref 1.9–3.7)
Glucose, Bld: 99 mg/dL (ref 65–99)
Potassium: 4.1 mmol/L (ref 3.5–5.3)
Sodium: 143 mmol/L (ref 135–146)
Total Bilirubin: 0.3 mg/dL (ref 0.2–1.2)
Total Protein: 6.5 g/dL (ref 6.1–8.1)
eGFR: 89 mL/min/{1.73_m2} (ref >=60)

## 2023-06-07 LAB — LIPID PANEL
Cholesterol: 165 mg/dL (ref <200)
HDL: 44 mg/dL — ABNORMAL LOW (ref >=50)
LDL Cholesterol (Calc): 98 mg/dL
Non-HDL Cholesterol (Calc): 121 mg/dL (ref <130)
Total CHOL/HDL Ratio: 3.8 (calc) (ref <5.0)
Triglycerides: 130 mg/dL (ref <150)

## 2023-06-07 LAB — TSH: TSH: 3.76 m[IU]/L (ref 0.40–4.50)

## 2023-06-10 LAB — DERMATOLOGY PATHOLOGY

## 2023-08-05 ENCOUNTER — Encounter: Payer: HMO | Admitting: Internal Medicine

## 2023-08-06 ENCOUNTER — Encounter: Payer: HMO | Admitting: Nurse Practitioner

## 2023-08-22 ENCOUNTER — Encounter: Payer: HMO | Admitting: Internal Medicine

## 2023-09-12 ENCOUNTER — Encounter: Payer: HMO | Admitting: Internal Medicine

## 2023-10-07 ENCOUNTER — Other Ambulatory Visit: Payer: Self-pay | Admitting: Internal Medicine

## 2023-10-07 DIAGNOSIS — Z1231 Encounter for screening mammogram for malignant neoplasm of breast: Secondary | ICD-10-CM

## 2023-10-10 ENCOUNTER — Other Ambulatory Visit: Payer: Self-pay | Admitting: Internal Medicine

## 2023-10-10 DIAGNOSIS — M85859 Other specified disorders of bone density and structure, unspecified thigh: Secondary | ICD-10-CM

## 2023-11-26 ENCOUNTER — Ambulatory Visit: Payer: HMO | Admitting: Nurse Practitioner

## 2023-11-28 ENCOUNTER — Ambulatory Visit

## 2023-12-10 ENCOUNTER — Ambulatory Visit: Payer: HMO | Admitting: Nurse Practitioner

## 2024-02-07 ENCOUNTER — Other Ambulatory Visit: Payer: Self-pay | Admitting: Ophthalmology

## 2024-02-12 LAB — DERMATOLOGY PATHOLOGY

## 2024-06-25 ENCOUNTER — Other Ambulatory Visit

## 2024-07-01 ENCOUNTER — Inpatient Hospital Stay (HOSPITAL_BASED_OUTPATIENT_CLINIC_OR_DEPARTMENT_OTHER): Admission: RE | Admit: 2024-07-01 | Discharge: 2024-07-01 | Attending: Internal Medicine | Admitting: Internal Medicine

## 2024-07-01 DIAGNOSIS — M8589 Other specified disorders of bone density and structure, multiple sites: Secondary | ICD-10-CM | POA: Insufficient documentation

## 2024-07-01 DIAGNOSIS — M85859 Other specified disorders of bone density and structure, unspecified thigh: Secondary | ICD-10-CM
